# Patient Record
Sex: Female | Born: 1985
Health system: Southern US, Community
[De-identification: ages and names within clinical notes are randomized; demographics above are authoritative.]

## PROBLEM LIST (undated history)

## (undated) DIAGNOSIS — G43709 Chronic migraine without aura, not intractable, without status migrainosus: Secondary | ICD-10-CM

## (undated) DIAGNOSIS — R112 Nausea with vomiting, unspecified: Secondary | ICD-10-CM

## (undated) DIAGNOSIS — M5137 Other intervertebral disc degeneration, lumbosacral region: Secondary | ICD-10-CM

## (undated) DIAGNOSIS — T4145XA Adverse effect of unspecified anesthetic, initial encounter: Secondary | ICD-10-CM

## (undated) DIAGNOSIS — T8859XA Other complications of anesthesia, initial encounter: Secondary | ICD-10-CM

## (undated) DIAGNOSIS — M51379 Other intervertebral disc degeneration, lumbosacral region without mention of lumbar back pain or lower extremity pain: Secondary | ICD-10-CM

## (undated) DIAGNOSIS — J45909 Unspecified asthma, uncomplicated: Secondary | ICD-10-CM

## (undated) DIAGNOSIS — Z9889 Other specified postprocedural states: Secondary | ICD-10-CM

## (undated) DIAGNOSIS — Z8489 Family history of other specified conditions: Secondary | ICD-10-CM

## (undated) DIAGNOSIS — Z973 Presence of spectacles and contact lenses: Secondary | ICD-10-CM

## (undated) DIAGNOSIS — IMO0002 Reserved for concepts with insufficient information to code with codable children: Secondary | ICD-10-CM

## (undated) HISTORY — DX: Chronic migraine without aura, not intractable, without status migrainosus: G43.709

## (undated) HISTORY — DX: Unspecified asthma, uncomplicated: J45.909

## (undated) HISTORY — PX: CHOLECYSTECTOMY: SHX55

## (undated) HISTORY — PX: TONSILECTOMY, ADENOIDECTOMY, BILATERAL MYRINGOTOMY AND TUBES: SHX2538

## (undated) HISTORY — DX: Other intervertebral disc degeneration, lumbosacral region: M51.37

## (undated) HISTORY — DX: Reserved for concepts with insufficient information to code with codable children: IMO0002

## (undated) HISTORY — DX: Other intervertebral disc degeneration, lumbosacral region without mention of lumbar back pain or lower extremity pain: M51.379

## (undated) HISTORY — PX: PILONIDAL CYST EXCISION: SHX744

---
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
## 2013-08-28 DIAGNOSIS — O4700 False labor before 37 completed weeks of gestation, unspecified trimester: Secondary | ICD-10-CM

## 2017-02-02 DIAGNOSIS — L0591 Pilonidal cyst without abscess: Secondary | ICD-10-CM | POA: Insufficient documentation

## 2018-11-01 ENCOUNTER — Encounter: Payer: Self-pay | Admitting: Physician Assistant

## 2018-11-01 ENCOUNTER — Ambulatory Visit (INDEPENDENT_AMBULATORY_CARE_PROVIDER_SITE_OTHER): Payer: Self-pay | Admitting: Physician Assistant

## 2018-11-01 VITALS — BP 110/82 | HR 83 | Temp 97.6°F | Resp 14 | Wt 192.0 lb

## 2018-11-01 DIAGNOSIS — R05 Cough: Secondary | ICD-10-CM

## 2018-11-01 DIAGNOSIS — R059 Cough, unspecified: Secondary | ICD-10-CM

## 2018-11-01 DIAGNOSIS — R0981 Nasal congestion: Secondary | ICD-10-CM

## 2018-11-01 DIAGNOSIS — J0101 Acute recurrent maxillary sinusitis: Secondary | ICD-10-CM

## 2018-11-01 MED ORDER — AZELASTINE HCL 0.1 % NA SOLN: 1.0000 | Freq: Two times a day (BID) | NASAL | 0 refills | Status: DC

## 2018-11-01 MED ORDER — PSEUDOEPH-BROMPHEN-DM 30-2-10 MG/5ML PO SYRP: 5.0000 mL | ORAL_SOLUTION | Freq: Four times a day (QID) | ORAL | 0 refills | Status: DC | PRN

## 2018-11-01 MED ORDER — DOXYCYCLINE HYCLATE 100 MG PO TABS: 100.0000 mg | ORAL_TABLET | Freq: Two times a day (BID) | ORAL | 0 refills | Status: AC

## 2018-11-01 NOTE — Patient Instructions (Signed)
Thank you for choosing [facility] for your health care needs.  You have been diagnosed with sinusitis.  You have been prescribed an antibiotic, Doxycycline.  You have been prescribed an antihistamine nasal spray, Astelin. Use in conjunction with Flonase nasal spray.  You have been prescribed a cough syrup with a decongestant; will help with cough and nasal congestion and sinus pressure/congestion.  Increase fluids. Rest.  Continue to use albuterol inhaler; 2 puffs every 4-6 hours for cough, wheezing, and chest tightness.  Follow-up with your family physician in one week if symptoms not improving. Follow-up sooner with any worsening symptoms.  Sinusitis, Adult Sinusitis is soreness and swelling (inflammation) of your sinuses. Sinuses are hollow spaces in the bones around your face. They are located:  Around your eyes.  In the middle of your forehead.  Behind your nose.  In your cheekbones. Your sinuses and nasal passages are lined with a fluid called mucus. Mucus drains out of your sinuses. Swelling can trap mucus in your sinuses. This lets germs (bacteria, virus, or fungus) grow, which leads to infection. Most of the time, this condition is caused by a virus. What are the causes? This condition is caused by:  Allergies.  Asthma.  Germs.  Things that block your nose or sinuses.  Growths in the nose (nasal polyps).  Chemicals or irritants in the air.  Fungus (rare). What increases the risk? You are more likely to develop this condition if:  You have a weak body defense system (immune system).  You do a lot of swimming or diving.  You use nasal sprays too much.  You smoke. What are the signs or symptoms? The main symptoms of this condition are pain and a feeling of pressure around the sinuses. Other symptoms include:  Stuffy nose (congestion).  Runny nose (drainage).  Swelling and warmth in the sinuses.  Headache.  Toothache.  A cough that may get worse at  night.  Mucus that collects in the throat or the back of the nose (postnasal drip).  Being unable to smell and taste.  Being very tired (fatigue).  A fever.  Sore throat.  Bad breath. How is this diagnosed? This condition is diagnosed based on:  Your symptoms.  Your medical history.  A physical exam.  Tests to find out if your condition is short-term (acute) or long-term (chronic). Your doctor may: ? Check your nose for growths (polyps). ? Check your sinuses using a tool that has a light (endoscope). ? Check for allergies or germs. ? Do imaging tests, such as an MRI or CT scan. How is this treated? Treatment for this condition depends on the cause and whether it is short-term or long-term.  If caused by a virus, your symptoms should go away on their own within 10 days. You may be given medicines to relieve symptoms. They include: ? Medicines that shrink swollen tissue in the nose. ? Medicines that treat allergies (antihistamines). ? A spray that treats swelling of the nostrils. ? Rinses that help get rid of thick mucus in your nose (nasal saline washes).  If caused by bacteria, your doctor may wait to see if you will get better without treatment. You may be given antibiotic medicine if you have: ? A very bad infection. ? A weak body defense system.  If caused by growths in the nose, you may need to have surgery. Follow these instructions at home: Medicines  Take, use, or apply over-the-counter and prescription medicines only as told by your doctor. These  may include nasal sprays.  If you were prescribed an antibiotic medicine, take it as told by your doctor. Do not stop taking the antibiotic even if you start to feel better. Hydrate and humidify   Drink enough water to keep your pee (urine) pale yellow.  Use a cool mist humidifier to keep the humidity level in your home above 50%.  Breathe in steam for 10-15 minutes, 3-4 times a day, or as told by your doctor.  You can do this in the bathroom while a hot shower is running.  Try not to spend time in cool or dry air. Rest  Rest as much as you can.  Sleep with your head raised (elevated).  Make sure you get enough sleep each night. General instructions   Put a warm, moist washcloth on your face 3-4 times a day, or as often as told by your doctor. This will help with discomfort.  Wash your hands often with soap and water. If there is no soap and water, use hand sanitizer.  Do not smoke. Avoid being around people who are smoking (secondhand smoke).  Keep all follow-up visits as told by your doctor. This is important. Contact a doctor if:  You have a fever.  Your symptoms get worse.  Your symptoms do not get better within 10 days. Get help right away if:  You have a very bad headache.  You cannot stop throwing up (vomiting).  You have very bad pain or swelling around your face or eyes.  You have trouble seeing.  You feel confused.  Your neck is stiff.  You have trouble breathing. Summary  Sinusitis is swelling of your sinuses. Sinuses are hollow spaces in the bones around your face.  This condition is caused by tissues in your nose that become inflamed or swollen. This traps germs. These can lead to infection.  If you were prescribed an antibiotic medicine, take it as told by your doctor. Do not stop taking it even if you start to feel better.  Keep all follow-up visits as told by your doctor. This is important. This information is not intended to replace advice given to you by your health care provider. Make sure you discuss any questions you have with your health care provider. Document Released: 04/18/2008 Document Revised: 04/02/2018 Document Reviewed: 04/02/2018 Elsevier Interactive Patient Education  2019 [employer].

## 2018-11-01 NOTE — Progress Notes (Signed)
Patient ID: Anvitha Hutmacher DOB: October 12, 1986 AGE: 32 y.o. MRN: [id number]   PCP: No primary care provider on file.   Chief Complaint:  Chief Complaint  Patient presents with  . sinus pressure and congestion    x2weeks     Subjective:    HPI:  Fatma Rutten is a 32 y.o. female presents for evaluation  Chief Complaint  Patient presents with  . sinus pressure and congestion    x1weeks    32 year old female presents to [facility] with three week history of sinus symptoms. Reports began over Thanksgiving holiday with sore throat, rhinorrhea, nasal congestion, and cough. Completed televisit (cannot find documentation in Epic) with about 1 week of symptoms. Prescribed antibiotic that started with the letter "C", unsure of the name (suspect Clindamycin or possibly cephalosporin such as Ceclor, Ceftin, Cefzil - patient with allergies to Cipro and Clarithromycin). Patient states she was also prescribed 2 doses of Diflucan due to post-antibiotic vaginal yeast infection.  Patient states symptoms improved, never fully resolved. Then worsened after completing antibiotic. Reports nasal congestion, bilateral maxillary sinus pressure/congestion, postnasal drip, dry throat, chest congestion, coarse cough, and chest tightness. Patient has been using OTC Mucinex, Flonase and previously prescribed albuterol inhaler (denies asthma diagnosis, states she's used albuterol inhaler previously with bronchitis). Patient states today developed upper teeth pain and worsening bilateral maxillary sinus pain. Son sick with similar symptoms; treated for otitis media with amoxicillin. Patient denies fever, chills, headache, ear pain, neck pain, chest pain, SOB. Patient with seasonal allergies; uses Zyrtec during the spring season. Uses Flonase all year round. Denies smoking history.  A limited review of symptoms was performed, pertinent positives and negatives as mentioned in HPI.  The following portions of the  patient's history were reviewed and updated as appropriate: allergies, current medications and past medical history.  There are no active problems to display for this patient.   Allergies  Allergen Reactions  . Azithromycin Hives  . Ciprofloxacin Swelling  . Moxifloxacin Hcl   . Penicillins Hives  . Bactrim [Sulfamethoxazole-Trimethoprim] Rash    No current outpatient medications on file prior to visit.   No current facility-administered medications on file prior to visit.        Objective:   Vitals:   11/01/18 0826  BP: 110/82  Pulse: 83  Resp: 14  Temp: 97.6 F (36.4 C)  SpO2: 99%     Wt Readings from Last 3 Encounters:  11/01/18 192 lb (87.1 kg)    Physical Exam:   General Appearance:  Patient sitting comfortably on examination table. Conversational. Kermit Balo self-historian. In no acute distress. Does not appear ill. Afebrile.   Head:  Normocephalic, without obvious abnormality, atraumatic  Eyes:  PERRL, conjunctiva/corneas clear, EOM's intact  Ears:  Bilateral ear canals WNL. No erythema or edema. No discharge/drainage. Bilateral TMs WNL. No erythema, injection, or serous effusion. No scar tissue.  Nose: Nares normal, septum midline. Nasal mucosa with bilateral edema. Thick clear rhinorrhea. Nasally sounding voice. Patient unable to inhale through nose. Tenderness with palpation over bilateral maxillary sinuses.  Throat: Lips, mucosa, and tongue normal; teeth and gums normal. Throat reveals no erythema. Mild postnasal drip. Tonsils with no enlargement or exudate.  Neck: Supple, symmetrical, trachea midline, bilateral anterior cervical lymphadenopathy, tender to palpation (left side worse than right)  Lungs:   Clear to auscultation bilaterally, respirations unlabored. No rales, rhonchi, crackles or wheezing. No wheezing with forced expiration. Dry cough elicited with deep inspiration.  Heart:  Regular rate and  rhythm, S1 and S2 normal, no murmur, rub, or gallop   Extremities: Extremities normal, atraumatic, no cyanosis or edema  Pulses: 2+ and symmetric  Skin: Skin color, texture, turgor normal, no rashes or lesions  Lymph nodes: Cervical, supraclavicular, and axillary nodes normal  Neurologic: Normal    Assessment & Plan:    Exam findings, diagnosis etiology and medication use and indications reviewed with patient. Follow-Up and discharge instructions provided. No emergent/urgent issues found on exam.  Patient education was provided.   Patient verbalized understanding of information provided and agrees with plan of care (POC), all questions answered. The patient is advised to call or return to clinic if condition does not see an improvement in symptoms, or to seek the care of the closest emergency department if condition worsens with the below plan.   1. Acute recurrent maxillary sinusitis  - doxycycline (VIBRA-TABS) 100 MG tablet; Take 1 tablet (100 mg total) by mouth 2 (two) times daily for 7 days.  Dispense: 14 tablet; Refill: 0  2. Nasal congestion  - azelastine (ASTELIN) 0.1 % nasal spray; Place 1 spray into both nostrils 2 (two) times daily. Use in each nostril as directed  Dispense: 30 mL; Refill: 0  3. Cough  - brompheniramine-pseudoephedrine-DM 30-2-10 MG/5ML syrup; Take 5 mLs by mouth 4 (four) times daily as needed.  Dispense: 120 mL; Refill: 0  Patient with 3 week history of URI/sinusitis. Improved but failed to resolve with suspected Clindamycin course. Now 1 week of worsening URI symptoms with bilateral maxillary sinus pain and upper teeth pain. Afebrile. VSS. Due to 2nd illness & duration of symptoms, will treat patient for bacterial infection. Prescribed course of Doxycycline, Astelin nasal spray (to be used in conjunction with Flonase), and Bromfed cough syrup (decongestant will help with nasal congestion and sinus pressure/congestion - patient has not been using decongestant).  Discussed with patient, if 2nd course of  antibiotic fails, will have to follow-up with PCP and/or ENT (not [facility]). At that time, sinus rinse with culture, CT scan of sinuses, and/or additional evaluation/treatment techniques may be utilized. Patient agrees with plan.   Darlin Priestly, MHS, PA-C Montey Hora, MHS, PA-C Advanced Practice Provider [facility]  [address], [facility], [place], [place] [address] (p):  [phone] [email or web address]

## 2018-11-05 ENCOUNTER — Telehealth: Payer: Self-pay | Admitting: Emergency Medicine

## 2018-11-05 NOTE — Telephone Encounter (Signed)
Attempted to contact patient number has been d/c This was a follow up call from [facility] visit.

## 2018-11-26 ENCOUNTER — Telehealth: Payer: Self-pay

## 2018-12-12 ENCOUNTER — Encounter: Payer: Self-pay | Admitting: Obstetrics and Gynecology

## 2018-12-12 ENCOUNTER — Ambulatory Visit (INDEPENDENT_AMBULATORY_CARE_PROVIDER_SITE_OTHER): Payer: No Typology Code available for payment source | Admitting: Obstetrics and Gynecology

## 2018-12-12 ENCOUNTER — Other Ambulatory Visit (HOSPITAL_COMMUNITY)
Admission: RE | Admit: 2018-12-12 | Discharge: 2018-12-12 | Disposition: A | Discharge status: Home / Self Care | Payer: No Typology Code available for payment source | Source: Ambulatory Visit | Attending: Obstetrics and Gynecology | Admitting: Obstetrics and Gynecology

## 2018-12-12 VITALS — BP 110/60 | HR 76 | Ht 67.0 in | Wt 187.0 lb

## 2018-12-12 DIAGNOSIS — Z13220 Encounter for screening for lipoid disorders: Secondary | ICD-10-CM

## 2018-12-12 DIAGNOSIS — Z124 Encounter for screening for malignant neoplasm of cervix: Secondary | ICD-10-CM | POA: Insufficient documentation

## 2018-12-12 DIAGNOSIS — Z789 Other specified health status: Secondary | ICD-10-CM

## 2018-12-12 DIAGNOSIS — Z1329 Encounter for screening for other suspected endocrine disorder: Secondary | ICD-10-CM

## 2018-12-12 DIAGNOSIS — Z01419 Encounter for gynecological examination (general) (routine) without abnormal findings: Secondary | ICD-10-CM | POA: Diagnosis not present

## 2018-12-12 DIAGNOSIS — Z0000 Encounter for general adult medical examination without abnormal findings: Secondary | ICD-10-CM

## 2018-12-12 DIAGNOSIS — Z130 Encounter for screening for diseases of the blood and blood-forming organs and certain disorders involving the immune mechanism: Secondary | ICD-10-CM

## 2018-12-12 DIAGNOSIS — Z131 Encounter for screening for diabetes mellitus: Secondary | ICD-10-CM

## 2018-12-12 NOTE — Progress Notes (Signed)
Gynecology Annual Exam   PCP: Patient, No Pcp Per  Chief Complaint:  Chief Complaint  Patient presents with  . Gynecologic Exam    History of Present Illness: Patient is a 33 y.o. G1P1001 presents for annual exam. The patient has no complaints today.   LMP: Patient's last menstrual period was 11/26/2018 (exact date). Average Interval: regular, 28 days Duration of flow: 6 days Heavy Menses: no Clots: no Intermenstrual Bleeding: no Postcoital Bleeding: no Dysmenorrhea: no  The patient is sexually active. She currently uses condoms for contraception. She denies dyspareunia.  The patient does not perform self breast exams.  There is no notable family history of breast or ovarian cancer in her family.  The patient wears seatbelts: yes.   The patient has regular exercise: yes.    The patient denies current symptoms of depression.    Review of Systems: ROS  Past Medical History:  Past Medical History:  Diagnosis Date  . Chronic migraine   . DDD (degenerative disc disease), lumbosacral     Past Surgical History:  Past Surgical History:  Procedure Laterality Date  . CHOLECYSTECTOMY    . PILONIDAL CYST EXCISION    . TONSILECTOMY, ADENOIDECTOMY, BILATERAL MYRINGOTOMY AND TUBES      Gynecologic History:  Patient's last menstrual period was 11/26/2018 (exact date). Contraception: condoms Last Pap: Results were: unknown   Obstetric History: G1P1001  Family History:  Family History  Problem Relation Age of Onset  . Brain cancer Father 40  . Valvular heart disease Sister   . Brain cancer Paternal Grandmother 63    Social History:  Social History   Socioeconomic History  . Marital status: Married    Spouse name: Not on file  . Number of children: Not on file  . Years of education: Not on file  . Highest education level: Not on file  Occupational History  . Not on file  Social Needs  . Financial resource strain: Not on file  . Food insecurity:    Worry:  Not on file    Inability: Not on file  . Transportation needs:    Medical: Not on file    Non-medical: Not on file  Tobacco Use  . Smoking status: Never Smoker  . Smokeless tobacco: Never Used  Substance and Sexual Activity  . Alcohol use: Yes    Comment: Social   . Drug use: Never  . Sexual activity: Yes    Birth control/protection: Condom  Lifestyle  . Physical activity:    Days per week: Not on file    Minutes per session: Not on file  . Stress: Not on file  Relationships  . Social connections:    Talks on phone: Not on file    Gets together: Not on file    Attends religious service: Not on file    Active member of club or organization: Not on file    Attends meetings of clubs or organizations: Not on file    Relationship status: Not on file  . Intimate partner violence:    Fear of current or ex partner: Not on file    Emotionally abused: Not on file    Physically abused: Not on file    Forced sexual activity: Not on file  Other Topics Concern  . Not on file  Social History Narrative  . Not on file    Allergies:  Allergies  Allergen Reactions  . Azithromycin Hives  . Ciprofloxacin Swelling  . Moxifloxacin Hcl   .  Penicillins Hives  . Bactrim [Sulfamethoxazole-Trimethoprim] Rash    Medications: Prior to Admission medications   Medication Sig Start Date End Date Taking? Authorizing Provider  azelastine (ASTELIN) 0.1 % nasal spray Place 1 spray into both nostrils 2 (two) times daily. Use in each nostril as directed 11/01/18  Yes Darlin Priestly, PA-C  Multiple Vitamins-Minerals (MULTIVITAL PO) Take by mouth.   Yes [provider]  topiramate (TOPAMAX) 25 MG tablet Take by mouth.   Yes [provider]    Physical Exam Vitals: Blood pressure 110/60, pulse 76, height 5\' 7"  (1.702 m), weight 187 lb (84.8 kg), last menstrual period 11/26/2018.  General: NAD HEENT: normocephalic, anicteric Thyroid: no enlargement, no palpable  nodules Pulmonary: No increased work of breathing, CTAB Cardiovascular: RRR, distal pulses 2+ Breast: Breast symmetrical, no tenderness, no palpable nodules or masses, no skin or nipple retraction present, no nipple discharge.  No axillary or supraclavicular lymphadenopathy. Abdomen: NABS, soft, non-tender, non-distended.  Umbilicus without lesions.  No hepatomegaly, splenomegaly or masses palpable. No evidence of hernia  Genitourinary:  External: Normal external female genitalia.  Normal urethral meatus, normal Bartholin's and Skene's glands.    Vagina: Normal vaginal mucosa, no evidence of prolapse.    Cervix: Grossly normal in appearance, no bleeding  Uterus: Non-enlarged, mobile, normal contour.  No CMT  Adnexa: ovaries non-enlarged, no adnexal masses  Rectal: deferred  Lymphatic: no evidence of inguinal lymphadenopathy Extremities: no edema, erythema, or tenderness Neurologic: Grossly intact Psychiatric: mood appropriate, affect full  Female chaperone present for pelvic and breast  portions of the physical exam    Assessment: 33 y.o. G1P1001 routine annual exam  Plan: Problem List Items Addressed This Visit    None    Visit Diagnoses    Healthcare maintenance    -  Primary   Relevant Orders   Cytology - PAP   Lipid panel   CBC   TSH + free T4   Basic Metabolic Panel (BMET)   Cervical cancer screening       Relevant Orders   Cytology - PAP   Lipid panel   Health maintenance alteration       Relevant Orders   TSH + free T4   Screening cholesterol level       Relevant Orders   Lipid panel   Thyroid disorder screening       Relevant Orders   TSH + free T4   Screening, anemia, deficiency, iron       Relevant Orders   CBC   Screening for diabetes mellitus       Relevant Orders   Basic Metabolic Panel (BMET)      2) STI screening  was offered and declined  2)  ASCCP guidelines and rational discussed.  Patient opts for every 3 years screening interval  3)  Contraception - the patient is currently using condoms. She is happy with her current form of contraception and plans to continue. May consider pregnancy in the future.  4) Routine healthcare maintenance including cholesterol, diabetes screening discussed Ordered today  5) No follow-ups on file.  Adrian Prows MD [facility], [facility] 12/12/2018 11:41 AM

## 2018-12-13 LAB — LIPID PANEL
Chol/HDL Ratio: 2.3 ratio (ref 0.0–4.4)
Cholesterol, Total: 170 mg/dL (ref 100–199)
HDL: 74 mg/dL (ref >39)
LDL Calculated: 82 mg/dL (ref 0–99)
TRIGLYCERIDES: 68 mg/dL (ref 0–149)
VLDL Cholesterol Cal: 14 mg/dL (ref 5–40)

## 2018-12-13 LAB — BASIC METABOLIC PANEL
BUN / CREAT RATIO: 19 (ref 9–23)
BUN: 14 mg/dL (ref 6–20)
CO2: 23 mmol/L (ref 20–29)
Calcium: 9.8 mg/dL (ref 8.7–10.2)
Chloride: 101 mmol/L (ref 96–106)
Creatinine, Ser: 0.74 mg/dL (ref 0.57–1.00)
GFR calc Af Amer: 125 mL/min/{1.73_m2} (ref >59)
GFR calc non Af Amer: 108 mL/min/{1.73_m2} (ref >59)
Glucose: 80 mg/dL (ref 65–99)
POTASSIUM: 4.6 mmol/L (ref 3.5–5.2)
SODIUM: 139 mmol/L (ref 134–144)

## 2018-12-13 LAB — CBC
HEMOGLOBIN: 15.4 g/dL (ref 11.1–15.9)
Hematocrit: 45.5 % (ref 34.0–46.6)
MCH: 30.8 pg (ref 26.6–33.0)
MCHC: 33.8 g/dL (ref 31.5–35.7)
MCV: 91 fL (ref 79–97)
Platelets: 341 10*3/uL (ref 150–450)
RBC: 5 x10E6/uL (ref 3.77–5.28)
RDW: 12.2 % (ref 11.7–15.4)
WBC: 10.3 10*3/uL (ref 3.4–10.8)

## 2018-12-13 LAB — TSH+FREE T4
Free T4: 1.13 ng/dL (ref 0.82–1.77)
TSH: 1.17 u[IU]/mL (ref 0.450–4.500)

## 2018-12-13 NOTE — Progress Notes (Signed)
Normal, released to mychart

## 2018-12-17 LAB — CYTOLOGY - PAP
Diagnosis: NEGATIVE
HPV: NOT DETECTED

## 2018-12-18 NOTE — Telephone Encounter (Signed)
Zolpidem er 12.5mg  is denied.

## 2018-12-21 NOTE — Progress Notes (Signed)
Negative, Released to mychart 

## 2019-03-05 MED FILL — TOPIRAMATE 25 MG TABLET: 25 | 30 days supply | Qty: 30 | Fill #0

## 2019-05-06 ENCOUNTER — Other Ambulatory Visit: Payer: Self-pay

## 2019-05-06 ENCOUNTER — Encounter: Payer: Self-pay | Admitting: *Deleted

## 2019-05-07 ENCOUNTER — Other Ambulatory Visit
Admission: RE | Admit: 2019-05-07 | Discharge: 2019-05-07 | Disposition: A | Discharge status: Home / Self Care | Payer: No Typology Code available for payment source | Source: Ambulatory Visit | Attending: Unknown Physician Specialty | Admitting: Unknown Physician Specialty

## 2019-05-07 DIAGNOSIS — Z1159 Encounter for screening for other viral diseases: Secondary | ICD-10-CM | POA: Insufficient documentation

## 2019-05-09 LAB — NOVEL CORONAVIRUS, NAA (HOSP ORDER, SEND-OUT TO REF LAB; TAT 18-24 HRS): SARS-CoV-2, NAA: NOT DETECTED

## 2019-05-10 ENCOUNTER — Ambulatory Visit: Payer: No Typology Code available for payment source | Admitting: Anesthesiology

## 2019-05-10 ENCOUNTER — Ambulatory Visit
Admission: RE | Admit: 2019-05-10 | Discharge: 2019-05-10 | Disposition: A | Discharge status: Home / Self Care | Payer: No Typology Code available for payment source | Attending: Unknown Physician Specialty | Admitting: Unknown Physician Specialty

## 2019-05-10 ENCOUNTER — Encounter
Admission: RE | Disposition: A | Discharge status: Home / Self Care | Payer: Self-pay | Source: Home / Self Care | Attending: Unknown Physician Specialty

## 2019-05-10 ENCOUNTER — Other Ambulatory Visit: Payer: Self-pay

## 2019-05-10 DIAGNOSIS — Z881 Allergy status to other antibiotic agents status: Secondary | ICD-10-CM | POA: Insufficient documentation

## 2019-05-10 DIAGNOSIS — J45909 Unspecified asthma, uncomplicated: Secondary | ICD-10-CM | POA: Insufficient documentation

## 2019-05-10 DIAGNOSIS — H722X1 Other marginal perforations of tympanic membrane, right ear: Secondary | ICD-10-CM | POA: Diagnosis not present

## 2019-05-10 DIAGNOSIS — Z79899 Other long term (current) drug therapy: Secondary | ICD-10-CM | POA: Diagnosis not present

## 2019-05-10 DIAGNOSIS — Z880 Allergy status to penicillin: Secondary | ICD-10-CM | POA: Diagnosis not present

## 2019-05-10 HISTORY — DX: Presence of spectacles and contact lenses: Z97.3

## 2019-05-10 HISTORY — DX: Other specified postprocedural states: R11.2

## 2019-05-10 HISTORY — DX: Family history of other specified conditions: Z84.89

## 2019-05-10 HISTORY — DX: Other complications of anesthesia, initial encounter: T88.59XA

## 2019-05-10 HISTORY — PX: TYMPANOPLASTY: SHX33

## 2019-05-10 HISTORY — DX: Other specified postprocedural states: Z98.890

## 2019-05-10 LAB — POCT PREGNANCY, URINE: Preg Test, Ur: NEGATIVE

## 2019-05-10 SURGERY — TYMPANOPLASTY
Anesthesia: General | Site: Ear | Laterality: Right

## 2019-05-10 MED ORDER — ONDANSETRON HCL 4 MG/2ML IJ SOLN: INTRAMUSCULAR | Status: DC | PRN

## 2019-05-10 MED ORDER — LIDOCAINE-EPINEPHRINE 1 %-1:100000 IJ SOLN
INTRAMUSCULAR | Status: DC | PRN
  Administered 2019-05-10: 3 mL

## 2019-05-10 MED ORDER — ONDANSETRON HCL 4 MG/2ML IJ SOLN
INTRAMUSCULAR | Status: DC | PRN
  Administered 2019-05-10: 4 mg via INTRAVENOUS

## 2019-05-10 MED ORDER — SCOPOLAMINE 1 MG/3DAYS TD PT72
1.0000 | MEDICATED_PATCH | TRANSDERMAL | Status: DC
  Administered 2019-05-10: 1.5 mg via TRANSDERMAL

## 2019-05-10 MED ORDER — ONDANSETRON HCL 4 MG/2ML IJ SOLN: 4.0000 mg | Freq: Once | INTRAMUSCULAR | Status: DC | PRN

## 2019-05-10 MED ORDER — MIDAZOLAM HCL 5 MG/5ML IJ SOLN
INTRAMUSCULAR | Status: DC | PRN
  Administered 2019-05-10: 2 mg via INTRAVENOUS

## 2019-05-10 MED ORDER — GLYCOPYRROLATE 0.2 MG/ML IJ SOLN
INTRAMUSCULAR | Status: DC | PRN
  Administered 2019-05-10: 0.1 mg via INTRAVENOUS

## 2019-05-10 MED ORDER — OXYCODONE HCL 5 MG PO TABS: 5.0000 mg | ORAL_TABLET | Freq: Once | ORAL | Status: DC | PRN

## 2019-05-10 MED ORDER — LIDOCAINE HCL (CARDIAC) PF 100 MG/5ML IV SOSY
PREFILLED_SYRINGE | INTRAVENOUS | Status: DC | PRN
  Administered 2019-05-10: 40 mg via INTRATRACHEAL

## 2019-05-10 MED ORDER — OXYCODONE HCL 5 MG/5ML PO SOLN: 5.0000 mg | Freq: Once | ORAL | Status: DC | PRN

## 2019-05-10 MED ORDER — DEXAMETHASONE SODIUM PHOSPHATE 4 MG/ML IJ SOLN
INTRAMUSCULAR | Status: DC | PRN
  Administered 2019-05-10: 10 mg via INTRAVENOUS

## 2019-05-10 MED ORDER — LACTATED RINGERS IV SOLN
INTRAVENOUS | Status: DC
  Administered 2019-05-10: 12:00:00 via INTRAVENOUS

## 2019-05-10 MED ORDER — OXYCODONE-ACETAMINOPHEN 5-325 MG PO TABS: 1.0000 | ORAL_TABLET | ORAL | 0 refills | Status: DC | PRN

## 2019-05-10 MED ORDER — EPINEPHRINE PF 1 MG/ML IJ SOLN
INTRAMUSCULAR | Status: DC | PRN
  Administered 2019-05-10: 1 mg

## 2019-05-10 MED ORDER — GELATIN ABSORBABLE 12-7 MM EX MISC
CUTANEOUS | Status: DC | PRN
  Administered 2019-05-10: 1 via TOPICAL

## 2019-05-10 MED ORDER — PROMETHAZINE HCL 25 MG/ML IJ SOLN
12.5000 mg | Freq: Once | INTRAMUSCULAR | Status: AC
  Administered 2019-05-10: 12.5 mg via INTRAVENOUS

## 2019-05-10 MED ORDER — HYDROMORPHONE HCL 1 MG/ML IJ SOLN: 0.2500 mg | INTRAMUSCULAR | Status: DC | PRN

## 2019-05-10 MED ORDER — PROPOFOL 10 MG/ML IV BOLUS
INTRAVENOUS | Status: DC | PRN
  Administered 2019-05-10: 140 mg via INTRAVENOUS
  Administered 2019-05-10: 30 mg via INTRAVENOUS

## 2019-05-10 MED ORDER — FENTANYL CITRATE (PF) 100 MCG/2ML IJ SOLN
INTRAMUSCULAR | Status: DC | PRN
  Administered 2019-05-10 (×2): 12.5 ug via INTRAVENOUS
  Administered 2019-05-10: 100 ug via INTRAVENOUS

## 2019-05-10 SURGICAL SUPPLY — 28 items
ADHESIVE MASTISOL STRL (MISCELLANEOUS) ×6 IMPLANT
BLADE EAR TYMPAN 2.5 60D BEAV (BLADE) ×3 IMPLANT
CANISTER SUCT 1200ML W/VALVE (MISCELLANEOUS) ×3 IMPLANT
CATH IV 18X1 1/4 SAFELET (CATHETERS) ×3 IMPLANT
CLEANER CAUTERY TIP 5X5 PAD (MISCELLANEOUS) ×1 IMPLANT
COTTONBALL LRG STERILE PKG (GAUZE/BANDAGES/DRESSINGS) ×3 IMPLANT
DRAPE HEAD BAR (DRAPES) ×3 IMPLANT
DRAPE MICROSCOPE ZEISS INVISI (DRAPES) ×3 IMPLANT
DRAPE SURG 17X11 SM STRL (DRAPES) ×6 IMPLANT
ELECT CAUTERY NDL 2.0 MIC (NEEDLE) IMPLANT
ELECT CAUTERY NEEDLE 2.0 MIC (NEEDLE) ×3 IMPLANT
GLOVE BIO SURGEON STRL SZ7.5 (GLOVE) ×6 IMPLANT
GLOVE SURG TRIUMPH 8.0 PF LTX (GLOVE) ×3 IMPLANT
IV CATH 18X1 1/4 SAFELET (CATHETERS) ×1
KIT TURNOVER KIT A (KITS) ×3 IMPLANT
NDL HYPO 25GX1X1/2 BEV (NEEDLE) ×1 IMPLANT
NEEDLE HYPO 25GX1X1/2 BEV (NEEDLE) ×3 IMPLANT
NS IRRIG 500ML POUR BTL (IV SOLUTION) ×3 IMPLANT
PACK ENT CUSTOM (PACKS) ×3 IMPLANT
PAD CLEANER CAUTERY TIP 5X5 (MISCELLANEOUS) ×2
SLEEVE PROTECTION STRL DISP (MISCELLANEOUS) ×6 IMPLANT
SOL PREP PVP 2OZ (MISCELLANEOUS) ×3
SOLUTION PREP PVP 2OZ (MISCELLANEOUS) ×1 IMPLANT
STAPLER SKIN PROX 35W (STAPLE) ×3 IMPLANT
STRAP BODY AND KNEE 60X3 (MISCELLANEOUS) ×3 IMPLANT
SUT PLAIN GUT FAST 5-0 (SUTURE) ×3 IMPLANT
SYR 3ML LL SCALE MARK (SYRINGE) ×6 IMPLANT
TOWEL OR 17X26 4PK STRL BLUE (TOWEL DISPOSABLE) ×3 IMPLANT

## 2019-05-10 NOTE — Anesthesia Postprocedure Evaluation (Signed)
Anesthesia Post Note  Patient: Therapist, music  Procedure(s) Performed: TYMPANOPLASTY (Right Ear)  Patient location during evaluation: PACU Anesthesia Type: General Level of consciousness: awake and alert Pain management: pain level controlled Vital Signs Assessment: post-procedure vital signs reviewed and stable Respiratory status: spontaneous breathing, nonlabored ventilation, respiratory function stable and patient connected to nasal cannula oxygen Cardiovascular status: blood pressure returned to baseline and stable Postop Assessment: no apparent nausea or vomiting Anesthetic complications: no    Trecia Rogers

## 2019-05-10 NOTE — Anesthesia Preprocedure Evaluation (Signed)
Anesthesia Evaluation  Patient identified by MRN, date of birth, ID band Patient awake    Reviewed: Allergy & Precautions, H&P , NPO status , Patient's Chart, lab work & pertinent test results, reviewed documented beta blocker date and time   History of Anesthesia Complications (+) PONV and history of anesthetic complications  Airway Mallampati: II  TM Distance: >3 FB Neck ROM: full    Dental no notable dental hx.    Pulmonary neg pulmonary ROS,    Pulmonary exam normal breath sounds clear to auscultation       Cardiovascular Exercise Tolerance: Good negative cardio ROS Normal cardiovascular exam Rhythm:regular Rate:Normal     Neuro/Psych negative neurological ROS  negative psych ROS   GI/Hepatic negative GI ROS, Neg liver ROS,   Endo/Other  negative endocrine ROS  Renal/GU negative Renal ROS  negative genitourinary   Musculoskeletal   Abdominal   Peds  Hematology negative hematology ROS (+)   Anesthesia Other Findings   Reproductive/Obstetrics negative OB ROS                             Anesthesia Physical Anesthesia Plan  ASA: II  Anesthesia Plan: General   Post-op Pain Management:    Induction:   PONV Risk Score and Plan:   Airway Management Planned:   Additional Equipment:   Intra-op Plan:   Post-operative Plan:   Informed Consent: I have reviewed the patients History and Physical, chart, labs and discussed the procedure including the risks, benefits and alternatives for the proposed anesthesia with the patient or authorized representative who has indicated his/her understanding and acceptance.     Dental Advisory Given  Plan Discussed with: CRNA and Anesthesiologist  Anesthesia Plan Comments:         Anesthesia Quick Evaluation

## 2019-05-10 NOTE — Op Note (Signed)
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  After general laryngeal mask anesthesia the table was turned 90 degrees.  A local anesthetic of 1% lidocaine with 1 100,000 units of epinephrine was used to inject the tragus the ear canal postauricular crease a total of 3 cc was used.  The right ear was then prepped and draped sterilely.  The operating microscope was brought in the field, examination of the tympanic membrane showed approximately 60% perforation the pars tensa posteriorly and inferiorly that involve the annulus.  A straight needle was then used to rim the perforation removing the epithelial tract there was also several lesions to the middle ear space which were lysed using the curved needle.  A tympanomeatal incision was made from 6-12 o'clock posterior canal wall.  A cotton ball with adrenaline was then placed in the canal.  Attention was then turned to the tragal graft harvesting, an incision was made along the leading edge of the tragus and a tragal perichondrial graft was harvested in the standard fashion.  This was placed in the fascia press.  Tragal incision was closed using interrupted 5-0 fast-absorbing gut.  The ear canal was readdressed the cotton balls were removed the tympanomeatal incision was elevated down to the annulus and the annulus was lifted anteriorly entering the middle ear space.  The incudostapedial joint could be visualized and was intact there is no erosion of the incus.  The middle ear space was then packed with Gelfoam.  The  tragal perichondrial graft was laid beneath the tympanic membrane in a medial underlay fashion beneath all edges of the tympanic membrane and with the posterior portion of the graft draped up the ear canal.  Tympanomeatal flap was then laid back over the posterior aspect of the graft and into anatomic position.  Gelfoam with adrenaline was then placed against the tympanomeatal incision.  Ear canal was then filled with bacitracin ointment followed by cottonball.  Patient was then returned to anesthesia where she was awakened in the operating room taken to her room in stable condition.    Dispo:   Good  Plan: Follow-up 10 days discharged home  Roena Malady  05/10/2019 1:15 PM

## 2019-05-10 NOTE — Discharge Instructions (Signed)
Scopolamine skin patches REMOVE PATCH IN 72 HOURS AND [place] HANDS IMMEDIATELY  What is this medicine? SCOPOLAMINE (skoe POL a meen) is used to prevent nausea and vomiting caused by motion sickness, anesthesia and surgery. This medicine may be used for other purposes; ask your health care provider or pharmacist if you have questions. COMMON BRAND NAME(S): Transderm Scop What should I tell my health care provider before I take this medicine? They need to know if you have any of these conditions: -are scheduled to have a gastric secretion test -glaucoma -heart disease -kidney disease -liver disease -lung or breathing disease, like asthma -mental illness -prostate disease -seizures -stomach or intestine problems -trouble passing urine -an unusual or allergic reaction to scopolamine, atropine, other medicines, foods, dyes, or preservatives -pregnant or trying to get pregnant -breast-feeding How should I use this medicine? This medicine is for external use only. Follow the directions on the prescription label. Wear only 1 patch at a time. Choose an area behind the ear, that is clean, dry, hairless and free from any cuts or irritation. Wipe the area with a clean dry tissue. Peel off the plastic backing of the skin patch, trying not to touch the adhesive side with your hands. Do not cut the patches. Firmly apply to the area you have chosen, with the metallic side of the patch to the skin and the tan-colored side showing. Once firmly in place, wash your hands well with soap and water. Do not get this medicine into your eyes. After removing the patch, wash your hands and the area behind your ear thoroughly with soap and water. The patch will still contain some medicine after use. To avoid accidental contact or ingestion by children or pets, fold the used patch in half with the sticky side together and throw away in the trash out of the reach of children and pets. If you need to use a second patch after  you remove the first, place it behind the other ear. A special MedGuide will be given to you by the pharmacist with each prescription and refill. Be sure to read this information carefully each time. Talk to your pediatrician regarding the use of this medicine in children. Special care may be needed. Overdosage: If you think you have taken too much of this medicine contact a poison control center or emergency room at once. NOTE: This medicine is only for you. Do not share this medicine with others. What if I miss a dose? This does not apply. This medicine is not for regular use. What may interact with this medicine? -alcohol -antihistamines for allergy cough and cold -atropine -certain medicines for anxiety or sleep -certain medicines for bladder problems like oxybutynin, tolterodine -certain medicines for depression like amitriptyline, fluoxetine, sertraline -certain medicines for stomach problems like dicyclomine, hyoscyamine -certain medicines for Parkinson's disease like benztropine, trihexyphenidyl -certain medicines for seizures like phenobarbital, primidone -general anesthetics like halothane, isoflurane, methoxyflurane, propofol -ipratropium -local anesthetics like lidocaine, pramoxine, tetracaine -medicines that relax muscles for surgery -phenothiazines like chlorpromazine, mesoridazine, prochlorperazine, thioridazine -narcotic medicines for pain -other belladonna alkaloids This list may not describe all possible interactions. Give your health care provider a list of all the medicines, herbs, non-prescription drugs, or dietary supplements you use. Also tell them if you smoke, drink alcohol, or use illegal drugs. Some items may interact with your medicine. What should I watch for while using this medicine? Limit contact with water while swimming and bathing because the patch may fall off. If the patch falls  off, throw it away and put a new one behind the other ear. You may get  drowsy or dizzy. Do not drive, use machinery, or do anything that needs mental alertness until you know how this medicine affects you. Do not stand or sit up quickly, especially if you are an older patient. This reduces the risk of dizzy or fainting spells. Alcohol may interfere with the effect of this medicine. Avoid alcoholic drinks. Your mouth may get dry. Chewing sugarless gum or sucking hard candy, and drinking plenty of water may help. Contact your healthcare professional if the problem does not go away or is severe. This medicine may cause dry eyes and blurred vision. If you wear contact lenses, you may feel some discomfort. Lubricating drops may help. See your healthcare professional if the problem does not go away or is severe. If you are going to need surgery, an MRI, CT scan, or other procedure, tell your healthcare professional that you are using this medicine. You may need to remove the patch before the procedure. What side effects may I notice from receiving this medicine? Side effects that you should report to your doctor or health care professional as soon as possible: -allergic reactions like skin rash, itching or hives; swelling of the face, lips, or tongue -blurred vision -changes in vision -confusion -dizziness -eye pain -fast, irregular heartbeat -hallucinations, loss of contact with reality -nausea, vomiting -pain or trouble passing urine -restlessness -seizures -skin irritation -stomach pain Side effects that usually do not require medical attention (report to your doctor or health care professional if they continue or are bothersome): -drowsiness -dry mouth -headache -sore throat This list may not describe all possible side effects. Call your doctor for medical advice about side effects. You may report side effects to FDA at 1-800-FDA-1088. Where should I keep my medicine? Keep out of the reach of children. Store at room temperature between 20 and 25 degrees C (68  and 77 degrees F). Keep this medicine in the foil package until ready to use. Throw away any unused medicine after the expiration date. NOTE: This sheet is a summary. It may not cover all possible information. If you have questions about this medicine, talk to your doctor, pharmacist, or health care provider.  2019 Elsevier/Gold Standard (2018-01-19 16:14:46)   General Anesthesia, Adult, Care After This sheet gives you information about how to care for yourself after your procedure. Your health care provider may also give you more specific instructions. If you have problems or questions, contact your health care provider. What can I expect after the procedure? After the procedure, the following side effects are common:  Pain or discomfort at the IV site.  Nausea.  Vomiting.  Sore throat.  Trouble concentrating.  Feeling cold or chills.  Weak or tired.  Sleepiness and fatigue.  Soreness and body aches. These side effects can affect parts of the body that were not involved in surgery. Follow these instructions at home:  For at least 24 hours after the procedure:  Have a responsible adult stay with you. It is important to have someone help care for you until you are awake and alert.  Rest as needed.  Do not: ? Participate in activities in which you could fall or become injured. ? Drive. ? Use heavy machinery. ? Drink alcohol. ? Take sleeping pills or medicines that cause drowsiness. ? Make important decisions or sign legal documents. ? Take care of children on your own. Eating and drinking  Follow any instructions  from your health care provider about eating or drinking restrictions.  When you feel hungry, start by eating small amounts of foods that are soft and easy to digest (bland), such as toast. Gradually return to your regular diet.  Drink enough fluid to keep your urine pale yellow.  If you vomit, rehydrate by drinking water, juice, or clear broth. General  instructions  If you have sleep apnea, surgery and certain medicines can increase your risk for breathing problems. Follow instructions from your health care provider about wearing your sleep device: ? Anytime you are sleeping, including during daytime naps. ? While taking prescription pain medicines, sleeping medicines, or medicines that make you drowsy.  Return to your normal activities as told by your health care provider. Ask your health care provider what activities are safe for you.  Take over-the-counter and prescription medicines only as told by your health care provider.  If you smoke, do not smoke without supervision.  Keep all follow-up visits as told by your health care provider. This is important. Contact a health care provider if:  You have nausea or vomiting that does not get better with medicine.  You cannot eat or drink without vomiting.  You have pain that does not get better with medicine.  You are unable to pass urine.  You develop a skin rash.  You have a fever.  You have redness around your IV site that gets worse. Get help right away if:  You have difficulty breathing.  You have chest pain.  You have blood in your urine or stool, or you vomit blood. Summary  After the procedure, it is common to have a sore throat or nausea. It is also common to feel tired.  Have a responsible adult stay with you for the first 24 hours after general anesthesia. It is important to have someone help care for you until you are awake and alert.  When you feel hungry, start by eating small amounts of foods that are soft and easy to digest (bland), such as toast. Gradually return to your regular diet.  Drink enough fluid to keep your urine pale yellow.  Return to your normal activities as told by your health care provider. Ask your health care provider what activities are safe for you. This information is not intended to replace advice given to you by your health care  provider. Make sure you discuss any questions you have with your health care provider. Document Released: 02/06/2001 Document Revised: 06/16/2017 Document Reviewed: 06/16/2017 Elsevier Interactive Patient Education  2019 [employer].

## 2019-05-10 NOTE — Transfer of Care (Signed)
Immediate Anesthesia Transfer of Care Note  Patient: Mary Richards  Procedure(s) Performed: TYMPANOPLASTY (Right Ear)  Patient Location: PACU  Anesthesia Type: General  Level of Consciousness: awake, alert  and patient cooperative  Airway and Oxygen Therapy: Patient Spontanous Breathing and Patient connected to supplemental oxygen  Post-op Assessment: Post-op Vital signs reviewed, Patient's Cardiovascular Status Stable, Respiratory Function Stable, Patent Airway and No signs of Nausea or vomiting  Post-op Vital Signs: Reviewed and stable  Complications: No apparent anesthesia complications

## 2019-05-10 NOTE — H&P (Signed)
The patient's history has been reviewed, patient examined, no change in status, stable for surgery.  Questions were answered to the patients satisfaction.  

## 2019-05-10 NOTE — Anesthesia Procedure Notes (Signed)
Procedure Name: LMA Insertion Date/Time: 05/10/2019 12:26 PM Performed by: Mayme Genta, CRNA Pre-anesthesia Checklist: Patient identified, Emergency Drugs available, Suction available, Timeout performed and Patient being monitored Patient Re-evaluated:Patient Re-evaluated prior to induction Oxygen Delivery Method: Circle system utilized Preoxygenation: Pre-oxygenation with 100% oxygen Induction Type: IV induction LMA: LMA inserted LMA Size: 4.0 Number of attempts: 1 Placement Confirmation: positive ETCO2 and breath sounds checked- equal and bilateral Tube secured with: Tape

## 2019-05-13 ENCOUNTER — Encounter: Payer: Self-pay | Admitting: Unknown Physician Specialty

## 2019-05-13 MED FILL — TOPIRAMATE 25 MG TABLET: 25 | 30 days supply | Qty: 30 | Fill #0

## 2019-05-13 NOTE — Progress Notes (Signed)
Patient complained of numbness on her bottom lip and brusing down her neck from the graft area, instructed her to notify MD office.

## 2019-07-01 ENCOUNTER — Telehealth: Payer: Self-pay | Admitting: Family

## 2019-07-01 NOTE — Telephone Encounter (Signed)
Patient has an appointment 07/08/19 scheduled as new patient.

## 2019-07-01 NOTE — Telephone Encounter (Signed)
Copied from [place] [id number]. Topic: Referral - Request for Referral >> Jul 01, 2019  2:57 PM Alanda Slim E wrote: Has patient seen PCP for this complaint? No  *If NO, is insurance requiring patient see PCP for this issue before PCP can refer them? yes  Referral for which specialty: chiropractor  Preferred provider/office: Dr. Rocco Pauls- [facility] [phone] Reason for referral: Pt has chronic back pains and back adjustments every so often   It wasn't allowing me to route crm so I created TE

## 2019-07-01 NOTE — Telephone Encounter (Signed)
Advise patient that we can address any referrals once she has established care ; her appt 07/08/19

## 2019-07-05 NOTE — Telephone Encounter (Signed)
Patient aware.

## 2019-07-08 ENCOUNTER — Ambulatory Visit: Payer: No Typology Code available for payment source | Admitting: Family

## 2019-07-08 ENCOUNTER — Ambulatory Visit (INDEPENDENT_AMBULATORY_CARE_PROVIDER_SITE_OTHER): Payer: No Typology Code available for payment source | Admitting: Family

## 2019-07-08 ENCOUNTER — Encounter: Payer: Self-pay | Admitting: Family

## 2019-07-08 ENCOUNTER — Other Ambulatory Visit: Payer: Self-pay

## 2019-07-08 VITALS — BP 120/78 | Ht 67.0 in | Wt 192.0 lb

## 2019-07-08 DIAGNOSIS — G43909 Migraine, unspecified, not intractable, without status migrainosus: Secondary | ICD-10-CM | POA: Insufficient documentation

## 2019-07-08 DIAGNOSIS — F411 Generalized anxiety disorder: Secondary | ICD-10-CM

## 2019-07-08 DIAGNOSIS — G8929 Other chronic pain: Secondary | ICD-10-CM | POA: Diagnosis not present

## 2019-07-08 DIAGNOSIS — M545 Low back pain, unspecified: Secondary | ICD-10-CM

## 2019-07-08 DIAGNOSIS — G43009 Migraine without aura, not intractable, without status migrainosus: Secondary | ICD-10-CM | POA: Diagnosis not present

## 2019-07-08 MED ORDER — SERTRALINE HCL 50 MG PO TABS: 50.0000 mg | ORAL_TABLET | Freq: Every day | ORAL | 3 refills | Status: DC

## 2019-07-08 MED ORDER — GABAPENTIN 100 MG PO CAPS: 100.0000 mg | ORAL_CAPSULE | Freq: Every day | ORAL | 3 refills | Status: DC

## 2019-07-08 MED ORDER — MELOXICAM 7.5 MG PO TABS: 7.5000 mg | ORAL_TABLET | Freq: Every day | ORAL | 0 refills | Status: DC | PRN

## 2019-07-08 MED ORDER — TOPIRAMATE 25 MG PO TABS: 25.0000 mg | ORAL_TABLET | Freq: Every day | ORAL | 1 refills | Status: DC

## 2019-07-08 NOTE — Patient Instructions (Addendum)
Please drop off xray reports here at the office so I can take look.   May stop ibuprofen.  A couple of points in regards to meloxicam ( Mobic) which you are starting.   This medication is not intended for daily , long term use. It is a potent anti inflammatory ( NSAID), and my intention is for you take as needed for moderate to severe pain. If you find yourself using daily, please let me know.   Please takes Mobic ( meloxicam) with FOOD since it is an anti-inflammatory as it can cause a GI bleed or ulcer. If you have a history of GI bleed or ulcer, please do NOT take.  Do no take over the counter aleve, motrin, advil, goody's powder for pain as they are also NSAIDs, and they are  in the same class as Mobic  Lastly, we will need to monitor kidney function while on Mobic and if we were to see any decline in kidney function in the future, we would have to discontinue this medication.    Start gabapentin; we may need to increase.   May try salon pas pain patch , BioFreeze which are over the counter.   Start zoloft.   Today we discussed referrals, orders.PT    I have placed these orders in the system for you.  Please be sure to give [place] a call if you have not heard from our office regarding this. We should hear from [place] within ONE week with information regarding your appointment. If not, please let me know immediately.     Follow up in one month with Philis Nettle, NP, sooner if any concerns!  Nice to meet you!

## 2019-07-08 NOTE — Assessment & Plan Note (Addendum)
Patient will drop off x-rays.  She states a history of degenerative disc disease, suspect line of work very much contributory. NO red flags at this time.  Advised physical therapy in lieu of chiropractor.  Will trial gabapentin low dose ( to increase at follow up likely) and meloxicam prn.  She will follow-up in 1 month.

## 2019-07-08 NOTE — Assessment & Plan Note (Signed)
Trial of zoloft; close f/u in one month.

## 2019-07-08 NOTE — Progress Notes (Signed)
This visit type was conducted due to national recommendations for restrictions regarding the COVID-19 pandemic (e.g. social distancing).  This format is felt to be most appropriate for this patient at this time.  All issues noted in this document were discussed and addressed.  No physical exam was performed (except for noted visual exam findings with Video Visits).  Virtual Visit via Video Note  I connected with@  on 07/08/19 at  2:00 PM EDT by a video enabled telemedicine application and verified that I am speaking with the correct person using two identifiers.  Location patient: home Location provider:work  Persons participating in the virtual visit: patient, provider  I discussed the limitations of evaluation and management by telemedicine and the availability of in person appointments. The patient expressed understanding and agreed to proceed.   HPI:  New patient.   Migraine- responds to topamax daily and will take tylenol for breakthrough. Takes topamax in the morning. Not sedating for her. Has been on medication for 2 years.  No vision loss, numbness to face. Has photophobia with HA, unilateral. HAs feel similar to HA's in the past.   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 Equities trader. Had xrays done this week with chiropracter.  States h/o DDD. Worse with activity however improves move moving around.  NO h/o cancer, trouble with bowel. No numbness, tingling, weakness in legs.  Taking 800mg  ibuprofen daily. No other arthralgia. No joint swelling, heat, redness  No ckd, autoimmune disease.   Increased anxiety with pandemic, children with online school. H/o of anxiety for years. No prior treatment with medication. No depression. No si/hi. No h/o suicidal attempts. Sleeping well.   Follows with OB GYN   ROS: See pertinent positives and negatives per HPI.  Past Medical History:  Diagnosis Date  .  Chronic migraine   . Complication of anesthesia   . DDD (degenerative disc disease), lumbosacral   . Family history of adverse reaction to anesthesia    Mother and Father - PONV  . PONV (postoperative nausea and vomiting)   . Wears contact lenses     Past Surgical History:  Procedure Laterality Date  . CHOLECYSTECTOMY    . PILONIDAL CYST EXCISION    . TONSILECTOMY, ADENOIDECTOMY, BILATERAL MYRINGOTOMY AND TUBES    . TYMPANOPLASTY Right 05/10/2019   Procedure: TYMPANOPLASTY;  Surgeon: Beverly Gust, MD;  Location: [facility];  Service: ENT;  Laterality: Right;    Family History  Problem Relation Age of Onset  . Brain cancer Father 86  . Valvular heart disease Sister   . Brain cancer Paternal Grandmother 48    SOCIAL HX: never smoker   Current Outpatient Medications:  .  albuterol (VENTOLIN HFA) 108 (90 Base) MCG/ACT inhaler, Inhale into the lungs every 6 (six) hours as needed for wheezing or shortness of breath., Disp: , Rfl:  .  Ascorbic Acid (VITAMIN C PO), Take by mouth., Disp: , Rfl:  .  azelastine (ASTELIN) 0.1 % nasal spray, Place 1 spray into both nostrils 2 (two) times daily. Use in each nostril as directed, Disp: 30 mL, Rfl: 0 .  Multiple Vitamins-Minerals (MULTIVITAL PO), Take by mouth., Disp: , Rfl:  .  topiramate (TOPAMAX) 25 MG tablet, Take 1 tablet (25 mg total) by mouth daily., Disp: 90 tablet, Rfl: 1 .  gabapentin (NEURONTIN) 100 MG capsule, Take 1 capsule (100 mg total) by mouth at bedtime., Disp: 90 capsule, Rfl: 3 .  meloxicam (MOBIC) 7.5 MG tablet, Take 1 tablet (7.5 mg total) by mouth daily as needed for pain., Disp: 90 tablet, Rfl: 0 .  sertraline (ZOLOFT) 50 MG tablet, Take 1 tablet (50 mg total) by mouth at bedtime., Disp: 90 tablet, Rfl: 3  EXAM:  VITALS per patient if applicable:  GENERAL: alert, oriented, appears well and in no acute distress  HEENT: atraumatic, conjunttiva clear, no obvious abnormalities on inspection of external nose  and ears  NECK: normal movements of the head and neck  LUNGS: on inspection no signs of respiratory distress, breathing rate appears normal, no obvious gross SOB, gasping or wheezing  CV: no obvious cyanosis  MS: moves all visible extremities without noticeable abnormality  PSYCH/NEURO: pleasant and cooperative, no obvious depression or anxiety, speech and thought processing grossly intact  ASSESSMENT AND PLAN:  Discussed the following assessment and plan:  Problem List Items Addressed This Visit      Cardiovascular and Mediastinum   Migraine    Stable at baseline. Doing well on topamax; will continue      Relevant Medications   gabapentin (NEURONTIN) 100 MG capsule   meloxicam (MOBIC) 7.5 MG tablet   sertraline (ZOLOFT) 50 MG tablet   topiramate (TOPAMAX) 25 MG tablet     Other   GAD (generalized anxiety disorder)    Trial of zoloft; close f/u in one month.      Relevant Medications   sertraline (ZOLOFT) 50 MG tablet   Chronic low back pain    Patient will drop off x-rays.  She states a history of degenerative disc disease, suspect line of work very much contributory. NO red flags at this time.  Advised physical therapy in lieu of chiropractor.  Will trial gabapentin low dose ( to increase at follow up likely) and meloxicam prn.  She will follow-up in 1 month.      Relevant Medications   meloxicam (MOBIC) 7.5 MG tablet    Other Visit Diagnoses    Midline low back pain without sciatica, unspecified chronicity    -  Primary   Relevant Medications   gabapentin (NEURONTIN) 100 MG capsule   meloxicam (MOBIC) 7.5 MG tablet   Other Relevant Orders   Ambulatory referral to Physical Therapy      I discussed the assessment and treatment plan with the patient. The patient was provided an opportunity to ask questions and all were answered. The patient agreed with the plan and demonstrated an understanding of the instructions.   The patient was advised to call back or seek  an in-person evaluation if the symptoms worsen or if the condition fails to improve as anticipated.   Mable Paris, FNP

## 2019-07-08 NOTE — Assessment & Plan Note (Signed)
Stable at baseline. Doing well on topamax; will continue

## 2019-07-24 ENCOUNTER — Ambulatory Visit: Payer: No Typology Code available for payment source | Admitting: Physical Therapy

## 2019-07-30 ENCOUNTER — Ambulatory Visit: Payer: No Typology Code available for payment source | Attending: Family | Admitting: Physical Therapy

## 2019-07-30 ENCOUNTER — Other Ambulatory Visit: Payer: Self-pay

## 2019-07-30 ENCOUNTER — Encounter: Payer: Self-pay | Admitting: Physical Therapy

## 2019-07-30 DIAGNOSIS — M546 Pain in thoracic spine: Secondary | ICD-10-CM | POA: Diagnosis present

## 2019-07-30 DIAGNOSIS — M545 Low back pain, unspecified: Secondary | ICD-10-CM

## 2019-07-30 DIAGNOSIS — M6281 Muscle weakness (generalized): Secondary | ICD-10-CM | POA: Diagnosis present

## 2019-07-30 DIAGNOSIS — G8929 Other chronic pain: Secondary | ICD-10-CM | POA: Insufficient documentation
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
## 2019-07-31 ENCOUNTER — Ambulatory Visit: Payer: No Typology Code available for payment source | Admitting: Physical Therapy

## 2019-08-07 ENCOUNTER — Ambulatory Visit: Payer: No Typology Code available for payment source | Admitting: Physical Therapy

## 2019-08-12 ENCOUNTER — Ambulatory Visit: Payer: No Typology Code available for payment source | Admitting: Physical Therapy

## 2019-08-12 ENCOUNTER — Other Ambulatory Visit: Payer: Self-pay

## 2019-08-12 DIAGNOSIS — M546 Pain in thoracic spine: Secondary | ICD-10-CM

## 2019-08-12 DIAGNOSIS — M6281 Muscle weakness (generalized): Secondary | ICD-10-CM

## 2019-08-12 DIAGNOSIS — M545 Low back pain, unspecified: Secondary | ICD-10-CM

## 2019-08-12 DIAGNOSIS — G8929 Other chronic pain: Secondary | ICD-10-CM
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
## 2019-08-13 ENCOUNTER — Encounter: Payer: Self-pay | Admitting: Physical Therapy

## 2019-08-16 ENCOUNTER — Other Ambulatory Visit: Payer: Self-pay

## 2019-08-16 ENCOUNTER — Ambulatory Visit: Payer: No Typology Code available for payment source | Attending: Family

## 2019-08-16 DIAGNOSIS — M6281 Muscle weakness (generalized): Secondary | ICD-10-CM | POA: Diagnosis present

## 2019-08-16 DIAGNOSIS — M546 Pain in thoracic spine: Secondary | ICD-10-CM | POA: Diagnosis present

## 2019-08-16 DIAGNOSIS — M545 Low back pain, unspecified: Secondary | ICD-10-CM

## 2019-08-16 DIAGNOSIS — G8929 Other chronic pain: Secondary | ICD-10-CM | POA: Insufficient documentation
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
## 2019-09-24 ENCOUNTER — Encounter: Payer: Self-pay | Admitting: Family

## 2019-09-24 NOTE — Telephone Encounter (Signed)
Since I have not seen her recently she would need to see me or someone here for [place] to give referral  LG

## 2019-09-27 ENCOUNTER — Ambulatory Visit: Payer: No Typology Code available for payment source | Admitting: Family Medicine

## 2019-09-27 ENCOUNTER — Ambulatory Visit
Admission: EM | Admit: 2019-09-27 | Discharge: 2019-09-27 | Disposition: A | Discharge status: Home / Self Care | Payer: No Typology Code available for payment source

## 2019-09-27 ENCOUNTER — Encounter: Payer: Self-pay | Admitting: *Deleted

## 2019-09-27 DIAGNOSIS — M25562 Pain in left knee: Secondary | ICD-10-CM

## 2019-09-27 DIAGNOSIS — R030 Elevated blood-pressure reading, without diagnosis of hypertension: Secondary | ICD-10-CM

## 2019-09-27 MED ORDER — IBUPROFEN 800 MG PO TABS: 800.0000 mg | ORAL_TABLET | Freq: Three times a day (TID) | ORAL | 0 refills | Status: DC | PRN

## 2019-09-27 NOTE — ED Triage Notes (Signed)
Patient reports left knee pain since halloween. States that she stepped off a stool and had a little bit of pain and it has continued to ache. Reports no pain with standing. Reports pain with extending knee when sitting. Has tried ace wraps and ice/heat. Reports ibuprofen does help.

## 2019-09-27 NOTE — ED Notes (Signed)
Left knee splint applied, no questions on how to apply at home.

## 2019-09-27 NOTE — Discharge Instructions (Addendum)
Take the ibuprofen as prescribed.  Rest and elevate your knee.  Apply ice packs 2-3 times a day for up to 20 minutes each.  Wear the knee sleeve as needed for comfort.    Follow up with the orthopedist listed below or your own orthopedist;  Call them to schedule an appointment.   Your blood pressure is elevated today at 132/95.  Please have this rechecked by your primary care provider in 2 weeks.

## 2019-09-27 NOTE — ED Provider Notes (Signed)
Roderic Palau    CSN: [id number] Arrival date & time: 09/27/19  1013      History   Chief Complaint Chief Complaint  Patient presents with  . Knee Pain    HPI Mary Richards is a 33 y.o. female.   Patient presents with a 2-week history of left knee pain.  She states it began when she stepped off a stool; she thinks she may have twisted it.  She has attempted treatment at home with Ace wrap, ice, heat, ibuprofen.  She reports that ibuprofen has helped.  She denies numbness, weakness, tingling.  The history is provided by the patient.    Past Medical History:  Diagnosis Date  . Chronic migraine   . Complication of anesthesia   . DDD (degenerative disc disease), lumbosacral   . Family history of adverse reaction to anesthesia    Mother and Father - PONV  . PONV (postoperative nausea and vomiting)   . Wears contact lenses     Patient Active Problem List   Diagnosis Date Noted  . GAD (generalized anxiety disorder) 07/08/2019  . Chronic low back pain 07/08/2019  . Migraine 07/08/2019    Past Surgical History:  Procedure Laterality Date  . CHOLECYSTECTOMY    . PILONIDAL CYST EXCISION    . TONSILECTOMY, ADENOIDECTOMY, BILATERAL MYRINGOTOMY AND TUBES    . TYMPANOPLASTY Right 05/10/2019   Procedure: TYMPANOPLASTY;  Surgeon: Beverly Gust, MD;  Location: [facility];  Service: ENT;  Laterality: Right;    OB History    Gravida  1   Para  1   Term  1   Preterm      AB      Living  1     SAB      TAB      Ectopic      Multiple      Live Births  1            Home Medications    Prior to Admission medications   Medication Sig Start Date End Date Taking? Authorizing Provider  cholecalciferol (VITAMIN D3) 25 MCG (1000 UT) tablet Take 1,000 Units by mouth daily.   Yes [provider]  albuterol (VENTOLIN HFA) 108 (90 Base) MCG/ACT inhaler Inhale into the lungs every 6 (six) hours as needed for wheezing or shortness of breath.     [provider]  Ascorbic Acid (VITAMIN C PO) Take by mouth.    [provider]  azelastine (ASTELIN) 0.1 % nasal spray Place 1 spray into both nostrils 2 (two) times daily. Use in each nostril as directed 11/01/18   Darlin Priestly, PA-C  gabapentin (NEURONTIN) 100 MG capsule Take 1 capsule (100 mg total) by mouth at bedtime. 07/08/19   Burnard Hawthorne, FNP  ibuprofen (ADVIL) 800 MG tablet Take 1 tablet (800 mg total) by mouth every 8 (eight) hours as needed. 09/27/19   Sharion Balloon, NP  meloxicam (MOBIC) 7.5 MG tablet Take 1 tablet (7.5 mg total) by mouth daily as needed for pain. 07/08/19   Burnard Hawthorne, FNP  Multiple Vitamins-Minerals (MULTIVITAL PO) Take by mouth.    [provider]  sertraline (ZOLOFT) 50 MG tablet Take 1 tablet (50 mg total) by mouth at bedtime. 07/08/19   Burnard Hawthorne, FNP  topiramate (TOPAMAX) 25 MG tablet Take 1 tablet (25 mg total) by mouth daily. 07/08/19   Burnard Hawthorne, FNP    Family History Family History  Problem Relation Age  of Onset  . Brain cancer Father 67  . Valvular heart disease Sister   . Brain cancer Paternal Grandmother 57    Social History Social History   Tobacco Use  . Smoking status: Never Smoker  . Smokeless tobacco: Never Used  Substance Use Topics  . Alcohol use: Yes    Alcohol/week: 2.0 standard drinks    Types: 2 Standard drinks or equivalent per week    Comment: Social   . Drug use: Never     Allergies   Azithromycin, Ciprofloxacin, Moxifloxacin hcl, Penicillins, and Bactrim [sulfamethoxazole-trimethoprim]   Review of Systems Review of Systems  Constitutional: Negative for chills and fever.  HENT: Negative for ear pain and sore throat.   Eyes: Negative for pain and visual disturbance.  Respiratory: Negative for cough and shortness of breath.   Cardiovascular: Negative for chest pain and palpitations.  Gastrointestinal: Negative for abdominal pain and vomiting.   Genitourinary: Negative for dysuria and hematuria.  Musculoskeletal: Positive for arthralgias. Negative for back pain.  Skin: Negative for color change and rash.  Neurological: Negative for seizures, syncope, weakness and numbness.  All other systems reviewed and are negative.    Physical Exam Triage Vital Signs ED Triage Vitals  Enc Vitals Group     BP      Pulse      Resp      Temp      Temp src      SpO2      Weight      Height      Head Circumference      Peak Flow      Pain Score      Pain Loc      Pain Edu?      Excl. in [place]?    No data found.  Updated Vital Signs BP (!) 132/95 (BP Location: Left Arm)   Pulse 77   Temp 98.3 F (36.8 C) (Oral)   Resp 16   LMP 09/09/2019   SpO2 97%   Visual Acuity Right Eye Distance:   Left Eye Distance:   Bilateral Distance:    Right Eye Near:   Left Eye Near:    Bilateral Near:     Physical Exam Vitals signs and nursing note reviewed.  Constitutional:      General: She is not in acute distress.    Appearance: She is well-developed.  HENT:     Head: Normocephalic and atraumatic.     Mouth/Throat:     Mouth: Mucous membranes are moist.  Eyes:     Conjunctiva/sclera: Conjunctivae normal.  Neck:     Musculoskeletal: Neck supple.  Cardiovascular:     Rate and Rhythm: Normal rate and regular rhythm.     Heart sounds: No murmur.  Pulmonary:     Effort: Pulmonary effort is normal. No respiratory distress.     Breath sounds: Normal breath sounds.  Abdominal:     Palpations: Abdomen is soft.     Tenderness: There is no abdominal tenderness. There is no guarding or rebound.  Musculoskeletal:        General: Swelling present. No tenderness or deformity.     Comments: Mild edema of left knee. FROM but reports discomfort with extension.    Skin:    General: Skin is warm and dry.     Capillary Refill: Capillary refill takes less than 2 seconds.     Findings: No bruising, erythema, lesion or rash.  Neurological:  General: No focal deficit present.     Mental Status: She is alert and oriented to person, place, and time.     Sensory: No sensory deficit.     Motor: No weakness.  Psychiatric:        Mood and Affect: Mood normal.        Behavior: Behavior normal.      UC Treatments / Results  Labs (all labs ordered are listed, but only abnormal results are displayed) Labs Reviewed - No data to display  EKG   Radiology No results found.  Procedures Procedures (including critical care time)  Medications Ordered in UC Medications - No data to display  Initial Impression / Assessment and Plan / UC Course  I have reviewed the triage vital signs and the nursing notes.  Pertinent labs & imaging results that were available during my care of the patient were reviewed by me and considered in my medical decision making (see chart for details).    Acute left knee pain.  Treating with ibuprofen, rest, elevation, ice, knee sleeve as needed for comfort.  Instructed patient to call the orthopedist to schedule an appointment.  Discussed with patient that her blood pressure is slightly elevated today and that this will need to be rechecked by her PCP in 2 to 4 weeks.  Patient agrees to plan of care.     Final Clinical Impressions(s) / UC Diagnoses   Final diagnoses:  Acute pain of left knee  Elevated blood pressure reading     Discharge Instructions     Take the ibuprofen as prescribed.  Rest and elevate your knee.  Apply ice packs 2-3 times a day for up to 20 minutes each.  Wear the knee sleeve as needed for comfort.    Follow up with the orthopedist listed below or your own orthopedist;  Call them to schedule an appointment.   Your blood pressure is elevated today at 132/95.  Please have this rechecked by your primary care provider in 2 weeks.       ED Prescriptions    Medication Sig Dispense Auth. Provider   ibuprofen (ADVIL) 800 MG tablet Take 1 tablet (800 mg total) by mouth every 8  (eight) hours as needed. 21 tablet Sharion Balloon, NP     PDMP not reviewed this encounter.   Sharion Balloon, NP 09/27/19 1051

## 2019-10-09 ENCOUNTER — Ambulatory Visit
Admission: RE | Admit: 2019-10-09 | Discharge: 2019-10-09 | Disposition: A | Discharge status: Home / Self Care | Payer: No Typology Code available for payment source | Source: Ambulatory Visit

## 2019-10-09 DIAGNOSIS — W57XXXA Bitten or stung by nonvenomous insect and other nonvenomous arthropods, initial encounter: Secondary | ICD-10-CM | POA: Diagnosis not present

## 2019-10-09 DIAGNOSIS — S90862A Insect bite (nonvenomous), left foot, initial encounter: Secondary | ICD-10-CM

## 2019-10-09 MED ORDER — DOXYCYCLINE HYCLATE 100 MG PO CAPS: 100.0000 mg | ORAL_CAPSULE | Freq: Two times a day (BID) | ORAL | 0 refills | Status: DC

## 2019-10-09 MED ORDER — PREDNISONE 20 MG PO TABS: 20.0000 mg | ORAL_TABLET | Freq: Every day | ORAL | 0 refills | Status: AC

## 2019-10-09 NOTE — Discharge Instructions (Signed)
Ice and elevation to help with swelling.  Ibuprofen as needed.  Activity as tolerated.  Daily antihistamine such as zyrtec/claritin or benadryl at regular dosing.  Three days of prednisone to help with allergic response.  We will cover for infection with antibiotics as well.  If any worsening or no improvement please be seen in person.

## 2019-10-09 NOTE — ED Provider Notes (Signed)
Virtual Visit via Video Note:  [name]  initiated request for Telemedicine visit with [facility] team. I connected with [name]  on 10/09/2019 at 1:42 PM  for a synchronized telemedicine visit using a video enabled HIPPA compliant telemedicine application. I verified that I am speaking with [name]  using two identifiers. Zigmund Gottron, NP  was physically located in a [facility] Urgent care site and Alandrea Suleman was located at a different location.   The limitations of evaluation and management by telemedicine as well as the availability of in-person appointments were discussed. Patient was informed that she  may incur a bill ( including co-pay) for this virtual visit encounter. Allyssa Wayment  expressed understanding and gave verbal consent to proceed with virtual visit.     History of Present Illness:Mary Richards  is a 33 y.o. female presents with complaints of bug bite to dorsal aspect of left foot. Initially noted affected area three days ago. The bite mark is now very painful and red. Firmness to the redness. Started out itching, no longer itches. There is a puncture site. Even appearance of bruising. Pain with the bend of toes, such as with walking. Was outside at the time of initial incident and felt a bite/sting sensation, she did strike at a bug on the foot but didn't get a good look at the bug. Has taken some benadryl which has helped with itching. Did take some ibuprofen which did help some.    Past Medical History:  Diagnosis Date  . Chronic migraine   . Complication of anesthesia   . DDD (degenerative disc disease), lumbosacral   . Family history of adverse reaction to anesthesia    Mother and Father - PONV  . PONV (postoperative nausea and vomiting)   . Wears contact lenses     Allergies  Allergen Reactions  . Azithromycin Hives and Shortness Of Breath  . Ciprofloxacin Swelling  . Moxifloxacin Hcl Hives  . Penicillins Hives  . Bactrim  [Sulfamethoxazole-Trimethoprim] Rash        Observations/Objective: Alert, oriented, non toxic in appearance. Clear coherent speech without difficulty. No increased work of breathing visualized.  Redness and swelling noted to dorsum of left foot extending to MTP joints of 2-4th toes, pinpoint puncture from presumed bug bite visible; no drainage or apparent abscess presence   Assessment and Plan: Bug bite with allergic response vs addition of infection. Prednisone as well as antibiotics provided. Encouraged in person visit if no improvement or any worsening. Patient verbalized understanding and agreeable to plan.     Follow Up Instructions:    I discussed the assessment and treatment plan with the patient. The patient was provided an opportunity to ask questions and all were answered. The patient agreed with the plan and demonstrated an understanding of the instructions.   The patient was advised to call back or seek an in-person evaluation if the symptoms worsen or if the condition fails to improve as anticipated.  I provided 15 minutes of non-face-to-face time during this encounter.    Zigmund Gottron, NP  10/09/2019 1:42 PM         Zigmund Gottron, NP 10/09/19 1352

## 2019-10-30 NOTE — Telephone Encounter (Signed)
Patient is schedule 10/31/19

## 2019-10-30 NOTE — Telephone Encounter (Signed)
Please schedule her for a virtual visit then- Thank you Katelyn.

## 2019-10-31 ENCOUNTER — Ambulatory Visit (INDEPENDENT_AMBULATORY_CARE_PROVIDER_SITE_OTHER): Payer: No Typology Code available for payment source | Admitting: Obstetrics and Gynecology

## 2019-10-31 ENCOUNTER — Encounter: Payer: Self-pay | Admitting: Obstetrics and Gynecology

## 2019-10-31 ENCOUNTER — Other Ambulatory Visit: Payer: Self-pay

## 2019-10-31 DIAGNOSIS — Z3009 Encounter for other general counseling and advice on contraception: Secondary | ICD-10-CM | POA: Diagnosis not present

## 2019-10-31 DIAGNOSIS — N644 Mastodynia: Secondary | ICD-10-CM | POA: Diagnosis not present

## 2019-10-31 MED ORDER — LEVONORGEST-ETH ESTRAD 91-DAY 0.15-0.03 &0.01 MG PO TABS: 1.0000 | ORAL_TABLET | Freq: Every day | ORAL | 4 refills | Status: DC

## 2019-10-31 NOTE — Progress Notes (Signed)
Virtual Visit via Telephone Note  I connected with Mary Richards on 10/31/19 at 11:10 AM EST by telephone and verified that I am speaking with the correct person using two identifiers.   I discussed the limitations, risks, security and privacy concerns of performing an evaluation and management service by telephone and the availability of in person appointments. I also discussed with the patient that there may be a patient responsible charge related to this service. The patient expressed understanding and agreed to proceed.  The patient was at home I spoke with the patient from my  office The names of people involved in this encounter were: Dr. Gilman Schmidt and [name].   History of Present Illness:  She reports that she has always had breast pain the week before her period.  Once her period starts that pain resolves quickly.  The breast pain has become excruciating. She can't sleep because of the pain. She has tried wearing a tight bra and taking NSAIDs little improvement. She can barely touch her breasts without pain.  Warm and cold compresses has provided about 30 minutes or relief. Pain is bilateral and under armpits and radiates to the sides of her breasts.  When she sleeps at nights and rolls over it is painful,  She has a C-cup size breast.  She has used nuvaring before, had a lot of spotting with this method.  She has been using condoms for 5-6 years. She has complete her family.  She drinks about 1 cup of coffee every 2-3 days.  LMP: 10/30/2019  Observations/Objective:   Physical Exam could not be performed. Because of the COVID-19 outbreak this visit was performed over the phone and not in person.   Assessment and Plan: 33 yo with cyclical breast pain She understands that OCP have not been proven to improve breast pain and that they may worsen breast pain. She would like to attempt a trial of OCP to see if she can have improvement in her breast symptoms.  She would like to  proceed with a trial ofextended cycle OCP to see if she can have improvement in her symptoms. Discussed other alternatives treatments such as tamoxifen if symptoms continue.   Follow Up Instructions: 3 months PRN   I discussed the assessment and treatment plan with the patient. The patient was provided an opportunity to ask questions and all were answered. The patient agreed with the plan and demonstrated an understanding of the instructions.   The patient was advised to call back or seek an in-person evaluation if the symptoms worsen or if the condition fails to improve as anticipated.  I provided 13 minutes of non-face-to-face time during this encounter.  Adrian Prows MD [facility], [facility] 10/31/2019 10:56 AM

## 2019-11-12 ENCOUNTER — Other Ambulatory Visit: Payer: Self-pay | Admitting: Physician Assistant

## 2019-11-12 DIAGNOSIS — M25562 Pain in left knee: Secondary | ICD-10-CM

## 2019-11-20 ENCOUNTER — Other Ambulatory Visit: Payer: Self-pay

## 2019-11-20 ENCOUNTER — Encounter: Payer: No Typology Code available for payment source | Admitting: Family

## 2019-11-20 ENCOUNTER — Ambulatory Visit
Admission: RE | Admit: 2019-11-20 | Discharge: 2019-11-20 | Disposition: A | Discharge status: Home / Self Care | Payer: No Typology Code available for payment source | Source: Ambulatory Visit | Attending: Physician Assistant | Admitting: Physician Assistant

## 2019-11-20 DIAGNOSIS — M25562 Pain in left knee: Secondary | ICD-10-CM | POA: Insufficient documentation

## 2019-12-10 ENCOUNTER — Encounter: Payer: Self-pay | Admitting: Physical Therapy

## 2019-12-10 DIAGNOSIS — M6281 Muscle weakness (generalized): Secondary | ICD-10-CM

## 2019-12-10 DIAGNOSIS — G8929 Other chronic pain: Secondary | ICD-10-CM

## 2019-12-10 DIAGNOSIS — M546 Pain in thoracic spine: Secondary | ICD-10-CM
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
## 2019-12-11 ENCOUNTER — Encounter: Payer: No Typology Code available for payment source | Admitting: Family

## 2019-12-12 ENCOUNTER — Other Ambulatory Visit: Payer: Self-pay

## 2019-12-12 ENCOUNTER — Other Ambulatory Visit: Payer: Self-pay | Admitting: Family

## 2019-12-12 DIAGNOSIS — M545 Low back pain, unspecified: Secondary | ICD-10-CM

## 2019-12-17 ENCOUNTER — Other Ambulatory Visit: Payer: Self-pay

## 2019-12-17 ENCOUNTER — Encounter: Payer: Self-pay | Admitting: Family

## 2019-12-17 ENCOUNTER — Ambulatory Visit (INDEPENDENT_AMBULATORY_CARE_PROVIDER_SITE_OTHER): Payer: No Typology Code available for payment source | Admitting: Family

## 2019-12-17 ENCOUNTER — Telehealth: Payer: Self-pay | Admitting: Family

## 2019-12-17 VITALS — BP 98/62 | HR 80 | Temp 97.9°F | Ht 66.5 in | Wt 189.4 lb

## 2019-12-17 DIAGNOSIS — R6889 Other general symptoms and signs: Secondary | ICD-10-CM | POA: Diagnosis not present

## 2019-12-17 DIAGNOSIS — G43009 Migraine without aura, not intractable, without status migrainosus: Secondary | ICD-10-CM | POA: Diagnosis not present

## 2019-12-17 DIAGNOSIS — M545 Low back pain, unspecified: Secondary | ICD-10-CM

## 2019-12-17 DIAGNOSIS — Z0000 Encounter for general adult medical examination without abnormal findings: Secondary | ICD-10-CM | POA: Diagnosis not present

## 2019-12-17 DIAGNOSIS — F411 Generalized anxiety disorder: Secondary | ICD-10-CM | POA: Diagnosis not present

## 2019-12-17 DIAGNOSIS — R899 Unspecified abnormal finding in specimens from other organs, systems and tissues: Secondary | ICD-10-CM

## 2019-12-17 DIAGNOSIS — G8929 Other chronic pain: Secondary | ICD-10-CM

## 2019-12-17 LAB — CBC WITH DIFFERENTIAL/PLATELET
Basophils Absolute: 0 10*3/uL (ref 0.0–0.1)
Basophils Relative: 0.3 % (ref 0.0–3.0)
Eosinophils Absolute: 0.3 10*3/uL (ref 0.0–0.7)
Eosinophils Relative: 2.7 % (ref 0.0–5.0)
HCT: 43.9 % (ref 36.0–46.0)
Hemoglobin: 14.7 g/dL (ref 12.0–15.0)
Lymphocytes Relative: 31.6 % (ref 12.0–46.0)
Lymphs Abs: 3.6 10*3/uL (ref 0.7–4.0)
MCHC: 33.6 g/dL (ref 30.0–36.0)
MCV: 92.5 fl (ref 78.0–100.0)
Monocytes Absolute: 0.7 10*3/uL (ref 0.1–1.0)
Monocytes Relative: 5.8 % (ref 3.0–12.0)
Neutro Abs: 6.7 10*3/uL (ref 1.4–7.7)
Neutrophils Relative %: 59.6 % (ref 43.0–77.0)
Platelets: 356 10*3/uL (ref 150.0–400.0)
RBC: 4.74 Mil/uL (ref 3.87–5.11)
RDW: 12.9 % (ref 11.5–15.5)
WBC: 11.3 10*3/uL — ABNORMAL HIGH (ref 4.0–10.5)

## 2019-12-17 LAB — HEMOGLOBIN A1C: Hgb A1c MFr Bld: 5.4 % (ref 4.6–6.5)

## 2019-12-17 LAB — IBC + FERRITIN
Ferritin: 48.6 ng/mL (ref 10.0–291.0)
Iron: 182 ug/dL — ABNORMAL HIGH (ref 42–145)
Saturation Ratios: 55.3 % — ABNORMAL HIGH (ref 20.0–50.0)
Transferrin: 235 mg/dL (ref 212.0–360.0)

## 2019-12-17 LAB — LIPID PANEL
Cholesterol: 158 mg/dL (ref 0–200)
HDL: 48.8 mg/dL (ref >39.00)
LDL Cholesterol: 90 mg/dL (ref 0–99)
NonHDL: 109.56
Total CHOL/HDL Ratio: 3
Triglycerides: 97 mg/dL (ref 0.0–149.0)
VLDL: 19.4 mg/dL (ref 0.0–40.0)

## 2019-12-17 LAB — COMPREHENSIVE METABOLIC PANEL
ALT: 16 U/L (ref 0–35)
AST: 13 U/L (ref 0–37)
Albumin: 4.1 g/dL (ref 3.5–5.2)
Alkaline Phosphatase: 53 U/L (ref 39–117)
BUN: 19 mg/dL (ref 6–23)
CO2: 24 mEq/L (ref 19–32)
Calcium: 9 mg/dL (ref 8.4–10.5)
Chloride: 107 mEq/L (ref 96–112)
Creatinine, Ser: 0.75 mg/dL (ref 0.40–1.20)
GFR: 89.3 mL/min (ref >60.00)
Glucose, Bld: 77 mg/dL (ref 70–99)
Potassium: 4.4 mEq/L (ref 3.5–5.1)
Sodium: 137 mEq/L (ref 135–145)
Total Bilirubin: 0.9 mg/dL (ref 0.2–1.2)
Total Protein: 6.3 g/dL (ref 6.0–8.3)

## 2019-12-17 LAB — VITAMIN D 25 HYDROXY (VIT D DEFICIENCY, FRACTURES): VITD: 32.13 ng/mL (ref 30.00–100.00)

## 2019-12-17 LAB — TSH: TSH: 1.37 u[IU]/mL (ref 0.35–4.50)

## 2019-12-17 MED ORDER — TRAZODONE HCL 50 MG PO TABS: 25.0000 mg | ORAL_TABLET | Freq: Every evening | ORAL | 0 refills | Status: DC | PRN

## 2019-12-17 NOTE — Patient Instructions (Signed)
Stay safe!  Please let us know if you do not hear from [place] in regards to allergy and dermatology referral  I will let you know once I hear from pharmacy in regards to trazodone and if you can take.    Health Maintenance, Female Adopting a healthy lifestyle and getting preventive care are important in promoting health and wellness. Ask your health care provider about:  The right schedule for you to have regular tests and exams.  Things you can do on your own to prevent diseases and keep yourself healthy. What should I know about diet, weight, and exercise? Eat a healthy diet   Eat a diet that includes plenty of vegetables, fruits, low-fat dairy products, and lean protein.  Do not eat a lot of foods that are high in solid fats, added sugars, or sodium. Maintain a healthy weight Body mass index (BMI) is used to identify weight problems. It estimates body fat based on height and weight. Your health care provider can help determine your BMI and help you achieve or maintain a healthy weight. Get regular exercise Get regular exercise. This is one of the most important things you can do for your health. Most adults should:  Exercise for at least 150 minutes each week. The exercise should increase your heart rate and make you sweat (moderate-intensity exercise).  Do strengthening exercises at least twice a week. This is in addition to the moderate-intensity exercise.  Spend less time sitting. Even light physical activity can be beneficial. Watch cholesterol and blood lipids Have your blood tested for lipids and cholesterol at 33 years of age, then have this test every 5 years. Have your cholesterol levels checked more often if:  Your lipid or cholesterol levels are high.  You are older than 33 years of age.  You are at high risk for heart disease. What should I know about cancer screening? Depending on your health history and family history, you may need to have cancer screening at  various ages. This may include screening for:  Breast cancer.  Cervical cancer.  Colorectal cancer.  Skin cancer.  Lung cancer. What should I know about heart disease, diabetes, and high blood pressure? Blood pressure and heart disease  High blood pressure causes heart disease and increases the risk of stroke. This is more likely to develop in people who have high blood pressure readings, are of African descent, or are overweight.  Have your blood pressure checked: ? Every 3-5 years if you are 74-79 years of age. ? Every year if you are 38 years old or older. Diabetes Have regular diabetes screenings. This checks your fasting blood sugar level. Have the screening done:  Once every three years after age 4 if you are at a normal weight and have a low risk for diabetes.  More often and at a younger age if you are overweight or have a high risk for diabetes. What should I know about preventing infection? Hepatitis B If you have a higher risk for hepatitis B, you should be screened for this virus. Talk with your health care provider to find out if you are at risk for hepatitis B infection. Hepatitis C Testing is recommended for:  Everyone born from [birth year] through 1964.  Anyone with known risk factors for hepatitis C. Sexually transmitted infections (STIs)  Get screened for STIs, including gonorrhea and chlamydia, if: ? You are sexually active and are younger than 33 years of age. ? You are older than 33 years of age  and your health care provider tells you that you are at risk for this type of infection. ? Your sexual activity has changed since you were last screened, and you are at increased risk for chlamydia or gonorrhea. Ask your health care provider if you are at risk.  Ask your health care provider about whether you are at high risk for HIV. Your health care provider may recommend a prescription medicine to help prevent HIV infection. If you choose to take medicine to prevent  HIV, you should first get tested for HIV. You should then be tested every 3 months for as long as you are taking the medicine. Pregnancy  If you are about to stop having your period (premenopausal) and you may become pregnant, seek counseling before you get pregnant.  Take 400 to 800 micrograms (mcg) of folic acid every day if you become pregnant.  Ask for birth control (contraception) if you want to prevent pregnancy. Osteoporosis and menopause Osteoporosis is a disease in which the bones lose minerals and strength with aging. This can result in bone fractures. If you are 67 years old or older, or if you are at risk for osteoporosis and fractures, ask your health care provider if you should:  Be screened for bone loss.  Take a calcium or vitamin D supplement to lower your risk of fractures.  Be given hormone replacement therapy (HRT) to treat symptoms of menopause. Follow these instructions at home: Lifestyle  Do not use any products that contain nicotine or tobacco, such as cigarettes, e-cigarettes, and chewing tobacco. If you need help quitting, ask your health care provider.  Do not use street drugs.  Do not share needles.  Ask your health care provider for help if you need support or information about quitting drugs. Alcohol use  Do not drink alcohol if: ? Your health care provider tells you not to drink. ? You are pregnant, may be pregnant, or are planning to become pregnant.  If you drink alcohol: ? Limit how much you use to 0-1 drink a day. ? Limit intake if you are breastfeeding.  Be aware of how much alcohol is in your drink. In the [place] one drink equals one 12 oz bottle of beer (355 mL), one 5 oz glass of wine (148 mL), or one 1 oz glass of hard liquor (44 mL). General instructions  Schedule regular health, dental, and eye exams.  Stay current with your vaccines.  Tell your health care provider if: ? You often feel depressed. ? You have ever been abused or do  not feel safe at home. Summary  Adopting a healthy lifestyle and getting preventive care are important in promoting health and wellness.  Follow your health care provider's instructions about healthy diet, exercising, and getting tested or screened for diseases.  Follow your health care provider's instructions on monitoring your cholesterol and blood pressure. This information is not intended to replace advice given to you by your health care provider. Make sure you discuss any questions you have with your health care provider. Document Revised: 10/24/2018 Document Reviewed: 10/24/2018 Elsevier Patient Education  2020 [employer].

## 2019-12-17 NOTE — Telephone Encounter (Signed)
LM that trazodone was not an interaction with allergies & trazodone was sent.

## 2019-12-17 NOTE — Progress Notes (Signed)
Subjective:    Patient ID: Mary Richards, female    DOB: 1986-04-07, 34 y.o.   MRN: [id number]  CC: Mary Richards is a 34 y.o. female who presents today for physical exam.    HPI: Presents today for her annual physical.  She has concerns  that she "stays cold all the time".  This has been going on for years.  She states she also notices it in the summer months.  She also endorses fatigue over the past few months.  She states her sleep is a little bit erratic ; sometimes after work she is a hard time falling asleep.  Other nights when she is really tired she falls asleep rather quickly when she takes gabapentin and Topamax.  She does  Think that anxiety contributory to poor sleep.  She stopped Zoloft due to side effects , weird dreams. She is currently working as a Quarry manager in ICU.  She also states headaches have been much improved on Topamax.  In the past she could not function with the headaches and now "I do not ever have a headache".  Denies vision changes or aura.  Low back pain-this is improved and she is doing well on meloxicam, gabapentin.  No weakness or numbness in her legs.  Recently she has been treated for left knee pain with a steroid injection and is taking meloxicam daily for this.  She is currently not taking ibuprofen  She is recently start working out again, and has lost 5 pounds in regards to this.  she would also like a referral dermatology for annual skin check and also for a lesion on the left cheek which she covers up with make-up. She would also like a referral to allergy for evaluation for antibiotic reactions   Colorectal Cancer Screening: No early family history Breast Cancer Screening: No early family history Cervical Cancer Screening: UTD        Tetanus - due        HIV Screening- Candidate for  Labs: Screening labs today. Exercise: Gets regular exercise.  Alcohol use: occasional Smoking/tobacco use: Nonsmoker.    HISTORY:  Past Medical History:    Diagnosis Date  . Chronic migraine   . Complication of anesthesia   . DDD (degenerative disc disease), lumbosacral   . Family history of adverse reaction to anesthesia    Mother and Father - PONV  . PONV (postoperative nausea and vomiting)   . Wears contact lenses     Past Surgical History:  Procedure Laterality Date  . CHOLECYSTECTOMY    . PILONIDAL CYST EXCISION    . TONSILECTOMY, ADENOIDECTOMY, BILATERAL MYRINGOTOMY AND TUBES    . TYMPANOPLASTY Right 05/10/2019   Procedure: TYMPANOPLASTY;  Surgeon: Beverly Gust, MD;  Location: [facility];  Service: ENT;  Laterality: Right;   Family History  Problem Relation Age of Onset  . Brain cancer Father 76  . Valvular heart disease Sister   . Brain cancer Paternal Grandmother 40      ALLERGIES: Azithromycin, Ciprofloxacin, Moxifloxacin hcl, Penicillins, and Bactrim [sulfamethoxazole-trimethoprim]  Current Outpatient Medications on File Prior to Visit  Medication Sig Dispense Refill  . albuterol (VENTOLIN HFA) 108 (90 Base) MCG/ACT inhaler Inhale into the lungs every 6 (six) hours as needed for wheezing or shortness of breath.    Marland Kitchen azelastine (ASTELIN) 0.1 % nasal spray Place 1 spray into both nostrils 2 (two) times daily. Use in each nostril as directed 30 mL 0  . cholecalciferol (VITAMIN D3) 25 MCG (1000  UT) tablet Take 1,000 Units by mouth daily.    Marland Kitchen gabapentin (NEURONTIN) 100 MG capsule Take 1 capsule (100 mg total) by mouth at bedtime. 90 capsule 3  . ibuprofen (ADVIL) 800 MG tablet Take 1 tablet (800 mg total) by mouth every 8 (eight) hours as needed. 21 tablet 0  . Levonorgestrel-Ethinyl Estradiol (AMETHIA) 0.15-0.03 &0.01 MG tablet Take 1 tablet by mouth daily. 1 Package 4  . meloxicam (MOBIC) 7.5 MG tablet TAKE 1 TABLET (7.5 MG TOTAL) BY MOUTH DAILY AS NEEDED FOR PAIN. 90 tablet 0  . Multiple Vitamin (MULTI-VITAMIN) tablet Take by mouth.    . topiramate (TOPAMAX) 25 MG tablet Take 1 tablet (25 mg total) by mouth  daily. 90 tablet 1  . Ascorbic Acid (VITAMIN C PO) Take by mouth.     No current facility-administered medications on file prior to visit.    Social History   Tobacco Use  . Smoking status: Never Smoker  . Smokeless tobacco: Never Used  Substance Use Topics  . Alcohol use: Yes    Alcohol/week: 2.0 standard drinks    Types: 2 Standard drinks or equivalent per week    Comment: Social   . Drug use: Never    Review of Systems  Constitutional: Positive for fatigue. Negative for chills, fever and unexpected weight change.  HENT: Negative for congestion.   Respiratory: Negative for cough.   Cardiovascular: Negative for chest pain, palpitations and leg swelling.  Gastrointestinal: Negative for nausea and vomiting.  Endocrine: Positive for cold intolerance. Negative for heat intolerance.  Musculoskeletal: Negative for arthralgias and myalgias.  Skin: Negative for rash.  Neurological: Negative for weakness and headaches (resolved).  Hematological: Negative for adenopathy.  Psychiatric/Behavioral: Negative for confusion.      Objective:    BP 98/62   Pulse 80   Temp 97.9 F (36.6 C) (Temporal)   Ht 5' 6.5" (1.689 m)   Wt 189 lb 6.4 oz (85.9 kg)   SpO2 99%   BMI 30.11 kg/m   BP Readings from Last 3 Encounters:  12/17/19 98/62  09/27/19 (!) 132/95  07/08/19 120/78   Wt Readings from Last 3 Encounters:  12/17/19 189 lb 6.4 oz (85.9 kg)  07/08/19 192 lb (87.1 kg)  05/10/19 192 lb (87.1 kg)    Physical Exam Vitals reviewed.  Constitutional:      Appearance: She is well-developed.  Eyes:     Conjunctiva/sclera: Conjunctivae normal.  Neck:     Thyroid: No thyroid mass or thyromegaly.  Cardiovascular:     Rate and Rhythm: Normal rate and regular rhythm.     Pulses: Normal pulses.     Heart sounds: Normal heart sounds.  Pulmonary:     Effort: Pulmonary effort is normal.     Breath sounds: Normal breath sounds. No wheezing, rhonchi or rales.  Chest:     Breasts:  Breasts are symmetrical.        Right: No inverted nipple, mass, nipple discharge, skin change or tenderness.        Left: No inverted nipple, mass, nipple discharge, skin change or tenderness.  Lymphadenopathy:     Head:     Right side of head: No submental, submandibular, tonsillar, preauricular, posterior auricular or occipital adenopathy.     Left side of head: No submental, submandibular, tonsillar, preauricular, posterior auricular or occipital adenopathy.     Cervical: No cervical adenopathy.     Right cervical: No superficial, deep or posterior cervical adenopathy.    Left cervical:  No superficial, deep or posterior cervical adenopathy.  Skin:    General: Skin is warm and dry.  Neurological:     Mental Status: She is alert.  Psychiatric:        Speech: Speech normal.        Behavior: Behavior normal.        Thought Content: Thought content normal.        Assessment & Plan:   Problem List Items Addressed This Visit      Cardiovascular and Mediastinum   Migraine    Improved/resolved on topamax. Continue to follow.        Other   Chronic low back pain    Stable. Continue current regimen. Advised NOT to mobic QD and to use prn.       Cold intolerance    Etiology nonspecific at this time.  Pending CBC, iron stores, TSH.      GAD (generalized anxiety disorder)    Anxiety affects sleep. Discussed prn therapy with trazodone. Will await to see if Catie PharmD says safe in regards to her drug allergies.      Routine physical examination - Primary    CBE performed. Deferred pelvic as she follows with GYN.        Relevant Orders   Ambulatory referral to Dermatology   Ambulatory referral to Allergy   TSH   CBC with Differential/Platelet   Comprehensive metabolic panel   Hemoglobin A1c   Lipid panel   VITAMIN D 25 Hydroxy (Vit-D Deficiency, Fractures)   IBC + Ferritin       I am having Elana Sedeno maintain her azelastine, albuterol, Ascorbic Acid (VITAMIN C  PO), gabapentin, topiramate, cholecalciferol, ibuprofen, Multi-Vitamin, Levonorgestrel-Ethinyl Estradiol, and meloxicam.   No orders of the defined types were placed in this encounter.   Return precautions given.   Risks, benefits, and alternatives of the medications and treatment plan prescribed today were discussed, and patient expressed understanding.   Education regarding symptom management and diagnosis given to patient on AVS.   Continue to follow with Burnard Hawthorne, FNP for routine health maintenance.   [name] and I agreed with plan.   Mable Paris, FNP

## 2019-12-17 NOTE — Assessment & Plan Note (Addendum)
Improved/resolved on topamax. Continue to follow.

## 2019-12-17 NOTE — Assessment & Plan Note (Signed)
CBE performed. Deferred pelvic as she follows with GYN.

## 2019-12-17 NOTE — Telephone Encounter (Signed)
Call pt Mary Richards pharmd didn't see any problems with trazodone based on her antibiotic allergies  I have sent in Please ensure she has f/u 3-6 months so we can see how she is doing on new medication

## 2019-12-17 NOTE — Assessment & Plan Note (Signed)
Etiology nonspecific at this time.  Pending CBC, iron stores, TSH.

## 2019-12-17 NOTE — Assessment & Plan Note (Signed)
Anxiety affects sleep. Discussed prn therapy with trazodone. Will await to see if Catie PharmD says safe in regards to her drug allergies.

## 2019-12-17 NOTE — Assessment & Plan Note (Signed)
Stable. Continue current regimen. Advised NOT to mobic QD and to use prn.

## 2019-12-18 NOTE — Addendum Note (Signed)
Addended by: Burnard Hawthorne on: 12/18/2019 08:50 PM   Modules accepted: Orders

## 2019-12-19 ENCOUNTER — Encounter: Payer: Self-pay | Admitting: Family

## 2019-12-31 ENCOUNTER — Other Ambulatory Visit: Payer: Self-pay

## 2019-12-31 ENCOUNTER — Encounter: Payer: Self-pay | Admitting: Allergy

## 2019-12-31 ENCOUNTER — Ambulatory Visit (INDEPENDENT_AMBULATORY_CARE_PROVIDER_SITE_OTHER): Payer: No Typology Code available for payment source | Admitting: Allergy

## 2019-12-31 VITALS — BP 116/68 | HR 76 | Temp 98.4°F | Resp 16 | Ht 66.0 in | Wt 193.0 lb

## 2019-12-31 DIAGNOSIS — J4520 Mild intermittent asthma, uncomplicated: Secondary | ICD-10-CM | POA: Diagnosis not present

## 2019-12-31 DIAGNOSIS — H109 Unspecified conjunctivitis: Secondary | ICD-10-CM | POA: Diagnosis not present

## 2019-12-31 DIAGNOSIS — J310 Chronic rhinitis: Secondary | ICD-10-CM | POA: Diagnosis not present

## 2019-12-31 DIAGNOSIS — T887XXA Unspecified adverse effect of drug or medicament, initial encounter: Secondary | ICD-10-CM | POA: Diagnosis not present

## 2019-12-31 NOTE — Progress Notes (Signed)
New Patient Note  RE: Mary Richards MRN: [id number] DOB: 09/06/1986 Date of Office Visit: 12/31/2019  Referring provider: Burnard Hawthorne, FNP Primary care provider: Burnard Hawthorne, FNP  Chief Complaint: Medication allergies  History of present illness: Mary Richards is a 34 y.o. female presenting today for consultation for history of drug allergies.  She has list of antibiotic medications that she is allergic to.  She would like to see if she truly has a drug allergy to these antibiotics at this time. She states her first reaction to an antibiotic was when she was 34yo to Avelox.   She states she had an eye infection that progressed and "got in my blood stream".  She states about 2 to 3 days into the antibiotic course she recalls developing hives.  The antibiotic was stopped.  She states with all of the reactions he has had besides the most recent one she has developed hives typically 2-3 doses or days into the medication regimen.  She states the hives usually have started on her hands, elbows or knees and then will spread to involve the entire body.  She states the hives have never started after the first dose.    She states in her early 82s she had an antibiotic that she states was likely a penicillin and again she developed hives about 2 to 3 days and and the medication was stopped.  She states she has had further medication reactions that seem to be more severe after she had her son about 6 or so years ago.  She states when she took Bactrim she developed a different type rash than she has had with her other medications.  This occurred about 4 to 6 years ago.  She states this rash was very "teeny tiny dots" on her hands and she did note some small peeling of the skin of the hands.  Denies any mucous membrane involvement.  Her most recent medication issue was about 2 to 3 years ago with azithromycin.  She states she had taken it before without any issue.    Again 2-3 doses then she  states she woke up and her palms were itchy initially and progressed.  She states the hives were worse in the skin creases.   She used benadryl and hydrocortisone cream.  The hives continued to spread throughout her body.  She also noted that her throat felt swollen and it hurt to swallow.  She also reports having hives all over her face as well.  She went to [facility] where she recalls that she got a steroid injection she believes was solumedrol.   She states it took 3 days for symptoms to resolve and she went back to UC and got another injection.  She also recalls being recommend to take zantac.    She states due to the Avelox issue but she was advised to avoid ciprofloxacin which is also been listed as an allergy.  She states her mom told her when she was a kid that when she had viruses she would have hives and they would go away.  She denies having hives at any other time without having an illness being treated for with antibiotics.  In Oct 2020 she was stung by fire ant on her foot and she had drastic foot swelling and it left a large bruise "under the skin".  She states she was treated with doxycyline as she states it looked like she developed a secondary skin infection.  She tolerated  the doxycycline well.  She states this is the only antibiotic that she has been able to use at this time.  Sh states she was told she had asthma after she had a bad chest cold with wheezing.  She was prescribed an albuterol inhaler for a respiratory illness about 5-6 years ago.  She only uses the albuterol during times of upper respiratory tract illnesses.  She reports seasonal allergies during spring and fall and will take allegra-D.  She reports symptoms of itchy eyes, watery nose, sneezing.    No history of eczema.     Review of systems: Review of Systems  Constitutional: Negative.   HENT: Negative.   Eyes: Negative.   Respiratory: Negative.   Cardiovascular: Negative.   Gastrointestinal: Negative.     Musculoskeletal: Negative.   Skin: Negative.   Neurological: Negative.     All other systems negative unless noted above in HPI  Past medical history: Past Medical History:  Diagnosis Date  . Asthma   . Chronic migraine   . Complication of anesthesia   . DDD (degenerative disc disease), lumbosacral   . Family history of adverse reaction to anesthesia    Mother and Father - PONV  . PONV (postoperative nausea and vomiting)   . Wears contact lenses     Past surgical history: Past Surgical History:  Procedure Laterality Date  . CHOLECYSTECTOMY    . PILONIDAL CYST EXCISION    . TONSILECTOMY, ADENOIDECTOMY, BILATERAL MYRINGOTOMY AND TUBES    . TYMPANOPLASTY Right 05/10/2019   Procedure: TYMPANOPLASTY;  Surgeon: Beverly Gust, MD;  Location: [place];  Service: ENT;  Laterality: Right;    Family history:  Family History  Problem Relation Age of Onset  . Brain cancer Father 48  . Valvular heart disease Sister   . Brain cancer Paternal Grandmother 43  . COPD Maternal Grandfather     Social history: She lives in a home with carpeting in the bedroom with gas and heat pump heating and central cooling.  There is a cat in the home.  Dog outside the home.  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  She denies a smoking history.  Medication List: Current Outpatient Medications  Medication Sig Dispense Refill  . azelastine (ASTELIN) 0.1 % nasal spray Place 1 spray into both nostrils 2 (two) times daily. Use in each nostril as directed 30 mL 0  . Biotin 1000 MCG tablet Take 1,000 mcg by mouth daily.    . fexofenadine (ALLEGRA) 180 MG tablet Take 180 mg by mouth daily as needed.    . gabapentin (NEURONTIN) 100 MG capsule Take 1 capsule (100 mg total) by mouth at bedtime. 90 capsule 3  . Levonorgestrel-Ethinyl Estradiol (AMETHIA) 0.15-0.03 &0.01 MG tablet Take 1 tablet by mouth daily. 1 Package 4  . meloxicam (MOBIC) 7.5 MG tablet TAKE 1 TABLET (7.5 MG TOTAL) BY MOUTH DAILY AS NEEDED FOR  PAIN. 90 tablet 0  . Multiple Vitamin (MULTI-VITAMIN) tablet Take by mouth.    Marland Kitchen PROAIR RESPICLICK 123XX123 (90 Base) MCG/ACT AEPB Inhale 2 Doses into the lungs as needed (every four to six hours for cough or wheeze).    Marland Kitchen topiramate (TOPAMAX) 25 MG tablet Take 1 tablet (25 mg total) by mouth daily. 90 tablet 1  . traZODone (DESYREL) 50 MG tablet Take 0.5-1 tablets (25-50 mg total) by mouth at bedtime as needed for sleep. 90 tablet 0  . VITAMIN D PO Take 5,000 mg by mouth daily.     No current  facility-administered medications for this visit.    Known medication allergies: Allergies  Allergen Reactions  . Azithromycin Hives, Shortness Of Breath and Swelling    Throat swelling  . Sulfa Antibiotics Hives  . Ciprofloxacin Swelling  . Moxifloxacin Hcl Hives  . Penicillins Hives  . Bactrim [Sulfamethoxazole-Trimethoprim] Rash     Physical examination: Blood pressure 116/68, pulse 76, temperature 98.4 F (36.9 C), temperature source Temporal, resp. rate 16, height 5\' 6"  (1.676 m), weight 193 lb (87.5 kg), SpO2 97 %.  General: Alert, interactive, in no acute distress. HEENT: PERRLA, TMs pearly gray, turbinates non-edematous without discharge, post-pharynx non erythematous. Neck: Supple without lymphadenopathy. Lungs: Clear to auscultation without wheezing, rhonchi or rales. {no increased work of breathing. CV: Normal S1, S2 without murmurs. Abdomen: Nondistended, nontender. Skin: Warm and dry, without lesions or rashes. Extremities:  No clubbing, cyanosis or edema. Neuro:   Grossly intact.  Diagnositics/Labs: Penicillin skin testing performed today: Histamine prick is positive Negative saline control prick is negative Pre-Pen prick is negative Penicillin G 5000 unit/mL prick is negative.  Saline control intradermal is negative Pre-Pen intradermal is negative Penicillin G 50 units/mL is negative Penicillin G 500 unit/mL is negative Penicillin G 5000 unit/mL negative  Assessment  and plan:   Drug allergy  -History including timing of symptoms related to medication administration and symptoms obtained today for the medications you have a listed allergy for  -It was discussed today that it is very likely that the hives developed during the course of the antibiotics may be related more so to the infection itself than the medication.  It is very common for hives to be a result of an illness infection however there are other causes of hives as well.  -There is only one medication that has a standardized test and that is to penicillins.  We did perform penicillin skin testing today which is negative.  Next step in determining if you are not allergic to penicillin at this time is to perform oral challenge to penicillin in the office.  Please schedule for a oral challenge.  Will need to remain off of antihistamines for 3 days prior to the challenge.  Plan to be in the office at least 2 hours for this challenge.  -The rest of your antibiotics does not have any formal skin testing thus we will proceed with graded oral challenge to the Avelox and lastly to the azithromycin.  -With Bactrim you had a slightly different symptom development with a healing type of a rash which could indicate a more serious type response to Bactrim.  Bactrim and sulfa-based antibiotics can cause varying degrees of skin rashes.  Do to this point recommend that she does avoid Bactrim.    Mild intermittent asthma triggered by URI  -Continue to have access to an albuterol inhaler and use as needed during upper respiratory tract infections  Rhinitis with conjunctivitis, presumed allergic  -If interested in allergy testing we can perform this at a later date.  -Continue as needed use of Allegra 180 mg daily as needed.  -For itchy or watery eyes can use over-the-counter Pataday 1 drop each eye daily as needed  -For nasal drainage recommend use of nasal antihistamine Astelin 2 sprays each nostril twice a day as  needed.  This is a prescription based medication.   Follow-up for in-office oral drug challenge. I appreciate the opportunity to take part in Littie's care. Please do not hesitate to contact me with questions.  Sincerely,   Prudy Feeler,  MD Allergy/Immunology [facility]

## 2019-12-31 NOTE — Patient Instructions (Addendum)
Drug allergy  -History including timing of symptoms related to medication administration and symptoms obtained today for the medications you have a listed allergy for  -It was discussed today that it is very likely that the hives developed during the course of the antibiotics may be related more so to the infection itself than the medication.  It is very common for hives to be a result of an illness infection however there are other causes of hives as well.  -There is only one medication that has a standardized test and that is to penicillins.  We did perform penicillin skin testing today which is negative.  Next step in determining if you are not allergic to penicillin at this time is to perform oral challenge to penicillin in the office.  Please schedule for a oral challenge.  Will need to remain off of antihistamines for 3 days prior to the challenge.  Plan to be in the office at least 2 hours for this challenge.  -The rest of your antibiotics does not have any formal skin testing thus we will proceed with graded oral challenge to the Avelox and lastly to the azithromycin.  -With Bactrim you had a slightly different symptom development with a healing type of a rash which could indicate a more serious type response to Bactrim.  Bactrim and sulfa-based antibiotics can cause varying degrees of skin rashes.  Do to this point recommend that she does avoid Bactrim.    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  -Continue as needed use of Allegra 180 mg daily as needed.  -For itchy or watery eyes can use over-the-counter Pataday 1 drop each eye daily as needed  -For nasal drainage recommend use of nasal antihistamine Astelin 2 sprays each nostril twice a day as needed.  This is a prescription based  medication.   Follow-up for in-office oral drug challenge.

## 2020-01-01 ENCOUNTER — Encounter: Payer: Self-pay | Admitting: Family

## 2020-01-01 DIAGNOSIS — Z889 Allergy status to unspecified drugs, medicaments and biological substances status: Secondary | ICD-10-CM | POA: Insufficient documentation

## 2020-01-03 ENCOUNTER — Other Ambulatory Visit: Payer: Self-pay | Admitting: Obstetrics and Gynecology

## 2020-01-03 DIAGNOSIS — Z3009 Encounter for other general counseling and advice on contraception: Secondary | ICD-10-CM

## 2020-01-03 MED ORDER — NORETHINDRONE ACET-ETHINYL EST 1.5-30 MG-MCG PO TABS: 1.0000 | ORAL_TABLET | Freq: Every day | ORAL | 4 refills | Status: DC

## 2020-01-03 NOTE — Progress Notes (Unsigned)
Selected hormonal contraceptives: Oral contraceptives (birth control pills) and other delivery methods [place] States brand name Progestin (mg)* Estrogen (micrograms) Notes  Monophasic combinations  Beyaz 28 Drospirenone (3) Ethinyl estradiol (20) Also approved for acne and premenstrual dysphoric disorder. In patients with conditions requiring chronic therapy with medications that may increase potassium, monitor serum potassium during the first treatment cycle and periodically thereafter if patient begins medication or develops a condition that increases risk for hyperkalemia.  Gianvi 28?     Loryna 28?     Nikki 28?     Yaz?     Amethyst 28 Levonorgestrel (0.09) Ethinyl estradiol (20)    Aviane 28 Levonorgestrel (0.1) Ethinyl estradiol (20) Packaged as active tablets for 21 days and placebo for 7 days.  Balcoltra 28     Falmina 28     Larissia 28     Lessina 28     Lutera 28     Orsythia 28     Sronyx 28     Blisovi Fe 1/20? Norethindrone acetate (1) Ethinyl estradiol (20) Available in a 28-day regimen with active pills for 24 days and ferrous fumarate for 4 days.  Junel Fe 1/20 28?     Larin Fe 1/20?     Loestrin Fe 1/20 28?     Melodetta 24 Fe chewable     Microgestin Fe 1/20 28?     Minastrin Fe 1/20 chewable     Taytulla capsules     Apri 28 Desogestrel (0.15) Ethinyl estradiol (30) Packaged as active tablets for 21 days and placebo for 7 days.  Cyred Eq 28     Emoquette 28     Enskyce 28     Juleber 28     Reclipsen 28     Ocella 28 Drospirenone (3) Ethinyl estradiol (30) In patients with conditions requiring chronic therapy with medications that may increase potassium, monitor serum potassium during the first treatment cycle and periodically thereafter if patient begins medication or develops a condition that increases risk for hyperkalemia.  Safyral 28     Tydemy 28     Yasmin 28     Zarah 28     Chateal 28 Levonorgestrel (0.15) Ethinyl estradiol (30) Packaged as  active tablets for 21 days and placebo for 7 days.  Kurvelo 28     Levora 0.15/30     Marlissa 28     Portia 28     Blisovi Fe 1.5/30 Norethindrone acetate (1.5) Ethinyl estradiol (30) Packaged as active tablets for 21 days and ferrous fumarate tablets for 7 days.  Larin FE 1.5/30?     Loestrin Fe 1.5/30 28?     Microgestin Fe 1.5/30 28     Junel Fe 1.5/30 28     Cryselle 28 Norgestrel (0.3)? Ethinyl estradiol (30) Packaged as active tablets for 21 days and placebo for 7 days.  Elinest 28     Low-Ogestrel 28     Kelnor 28 Ethynodiol diacetate (1) Ethinyl estradiol (35) Packaged as active tablets for 21 days and placebo for 7 days.  Pirmella 1/35     Zovia 1/35E 28     Balziva 28 Norethindrone (0.4) Ethinyl estradiol (35) Packaged as active tablets for 21 days and placebo for 7 days.  Briellyn 28     Philith 28     Vyfemla     Brevicon 28 Norethindrone (0.5) Ethinyl estradiol (35) Packaged as active tablets for 21 days and placebo for 7 days.  Necon 0.5/35 28  Nortrel 0.5/35 28     Wera     Alyacen 1/35 28 Norethindrone (1) Ethinyl estradiol (35) Packaged as active tablets for 21 days and placebo for 7 days.  Cyclafem 1/35     Dasetta 1/35 28     Nortrel 1/35 28?     Ortho-Novum 1/35 28     Femynor Norgestimate (0.25) Ethinyl estradiol (35) Packaged as active tablets for 21 days and placebo for 7 days.  Mili     MonoNessa 28     Ortho-Cyclen 28     Previfem 28     Sprintec [address], [place], Diane 35 Not available in the [place]; [place] product shown Cyproterone (2) Ethinyl estradiol (35) Labeled approval in [place] is for treatment of acne.  Kelnor 1/50 28 Ethynodiol diacetate (1) Ethinyl estradiol (50) Packaged as active tablets for 21 days and placebo for 7 days. NOTE: Pills containing 50 mcg of ethinyl estradiol are not indicated for routine contraceptive use because of increased risk of cardiovascular events compared with lower dose oral  contraceptive pills.  Ogestrel 0.5/50 28 Norgestrel (0.5)? Ethinyl estradiol (50) Packaged as active tablets for 21 days and placebo for 7 days. NOTE: Pills containing 50 mcg of ethinyl estradiol are not indicated for routine contraceptive use because of increased risk of cardiovascular events compared with lower dose oral contraceptive pills.  Zoely? Nomegestrol acetate (2.5) Estradiol (as hemihydrate) (1.5 milligrams) Not available in the [place]; [place] and [employer] product shown.  Multiphasic combinations  Natazia 28 Dienogest (0,2,3,0) Estradiol valerate (3,2,2,1) NOTE: Estradiol strength listed in milligrams (mg) Packaged as active tablets for 26 days and placebo for 2 days.  Lo Loestrin Fe Norethindrone acetate (1,0) Ethinyl estradiol (10,10) Packaged as active tablets for 26 days and ferrous fumarate for 2 days.  Azurette 28 Desogestrel (0.15,0,0) Ethinyl estradiol (20,0,10) Packaged as active tablets for 26 days and placebo for 2 days or ferrous fumarate for Lo Loestrin Fe.  Bekyree 28     Kariva 28     Mircette 28     Pimtrea 28     Viorele 28     Volnea 28     Estrostep Fe 28 Norethindrone acetate (1,1,1) Ethinyl estradiol (20,30,35) Also approved for acne. Packaged as active tablets for 21 days and ferrous fumarate tablets for 7 days.  Tilia Fe 28     Tri-Legest Fe 28     Ortho Tri-Cyclen Lo 28 Norgestimate (0.18,0.215,0.25) Ethinyl estradiol (25,25,25) Packaged as active tablets for 21 days and placebo for 7 days.  Tri-Lo-Marzia     Tri-Lo-Sprintec     TriNessa Lo     Caziant 28 Desogestrel (0.1,0.125,0.15) Ethinyl estradiol (25,25,25) Packaged as active tablets for 21 days and placebo for 7 days.  Cyclessa 28     Velivet     Enpresse 28 Levonorgestrel (0.05,0.075,0.125) Ethinyl estradiol (30,40,30)    Levonest 28     Myzilra 28     Trivora 28     Ortho Tri-Cyclen 28 Norgestimate (0.18,0.215,0.25) Ethinyl estradiol AN:[id number]) Also approved for  acne. Packaged as active tablets for 21 days and placebo for 7 days.  Tri-Femynor 28     Tri-Linyah 28     Tri-Previfem 28     Tri-Sprintec 28     TriNessa 28     Alyacen 7/7/7 28 Norethindrone (0.5,0.75,1) Ethinyl estradiol AN:[id number]) Packaged as active tablets for 21 days and placebo for 7 days.  Cyclafem 7/7/7  28     Dasetta 7/7/7 28     Nortrel 7/7/7 28     Ortho-Novum 7/7/7 28     Pirmella 7/7/7     Aranelle 7/9/5 28 Norethindrone (0.5,1,0.5) Ethinyl estradiol (35,35,35) Packaged as active tablets for 21 days and placebo for 7 days.  Leena 7/9/5 28     Extended combinations  Amethia Lo 91 Levonorgestrel (0.1) Ethinyl estradiol (20,10) Packaged as a 91-day regimen: 84 days of the combination and 7 days of ethinyl estradiol only.  Camrese Lo 91     LoJaimiess 91     LoSeasonique 91     Fayosim 91 Levonorgestrel (0.15) Ethinyl estradiol (20,25,30,10) Packaged as a 91-day regimen: 84 days of the combination and 7 days of ethinyl estradiol only.  Rivelsa 91     QuarteSe 91     Amethia 91 Levonorgestrel (0.15) Ethinyl estradiol (30,10) Packaged as a 91-day regimen: 84 days of the combination and 7 days of ethinyl estradiol only.  Ashlyna     Camrese 91     Jaimiess 91     Seasonique 91     Introvale 91 Levonorgestrel (0.15) Ethinyl estradiol (30) Packaged as a 91-day regimen: active tablets for 84 days and placebo for 7 days.  Jolessa 91     Quasense 91     Continuous combinations  May use any monophasic 21/7 combination by taking active hormone pills for 28 or more days continuously. Refer to example (Amethyst) at right; any progestin may be used, and higher doses of ethinyl estradiol may be used in some women. Refer to the topic in UpToDate. Levonorgestrel (0.09) Ethinyl estradiol (20)    Progestin-only  Camila 28 Norethindrone (0.35) None Packaged as active tablets for 28 days.  Deblitane     Errin 28     Heather     Incassia     Jencycla     Jolivette 28     Nora-BE 28      Norlyda     Ortho Micronor     Sharobel     Slynd Drospirenone (4) None Packaged as active tablets for 24 days and placebo for 4 days. In patients with conditions requiring chronic therapy with medications that may increase potassium, monitor serum potassium during the first treatment cycle and periodically thereafter if patient begins medication or develops a condition that increases risk for hyperkalemia.  Transdermal patch, weekly  Xulane Norelgestromin (releases 0.15 mg/day) Ethinyl estradiol (releases 35 mcg/day) May have diminished efficacy in women ?90 kg. A new patch is applied every 7 days for 3 weeks followed by a patch-free week. This is therapeutically equivalent to Ortho Evra, which is no longer available in the [place].  Twirla Levonorgestrel (releases 0.12 mg/day) Ethinyl estradiol (releases 30 mcg/day) Contraindicated in women with BMI ?30 kg/m2 due to decreased efficacy and increased risk of VTE. Diminished efficacy was observed in women with BMI ?25 kg/m2. A new patch is applied every 7 days for 3 weeks followed by a patch-free week.  Vaginal ring, monthly  NuvaRing Etonogestrel (releases 0.12 mg/day) Ethinyl estradiol (releases 15 mcg/day) Ring is inserted for 3 weeks followed by 1 week without ring in place. A new ring is inserted 7 days after the last was removed.  EluRyng     Annovera Segesterone (releases 0.15 mg/day) Ethinyl estradiol (releases 13 mcg/day) Ring is inserted for 3 weeks followed by 1 week without ring in place. The ring is then reinserted for the first 21 days of subsequent 28-day  cycles. One system provides contraception for 13 28-day cycles (1 year). Not yet adequately evaluated in women with BMI >29 kg/m2.  Oral and IUD emergency contraceptive options are listed in a table that is available separately in UpToDate. Generic (non-branded) products are also available for most combination oral contraceptives in the [place]. Descriptions are for  [place]-available products unless noted otherwise. Consult local product information before use. Fe: contains iron; BMI: body mass index; VTE: venous thromboembolism; IUD: intrauterine device. * Different progestins are not equivalent on a milligram basis. Refer to the UpToDate overview of combined hormonal contraceptives for guidance on selection.  Also contains 451 mcg of levomefolate calcium per tablet. Johnney Killian is taken for 24 days followed by 4 days of levomefolate calcium alone. Safyral and Tydemy are taken for 21 days followed by 7 days of levomefolate calcium alone. ? Taken as active pills for 24 days and placebo for 4 days. ? Packaged as active tablets for 21 days and ferrous fumarate tablets for 7 days.  Also available as a 21-day regimen that does not contain iron.  Packaged as active tablets for 21 days and placebo for 7 days. ? Also available in a 21-day regimen. ? The progestin norgestrel contains two isomers; only levonorgestrel is bioactive. The amount of norgestrel in each tablet is twice the amount of levonorgestrel. Modified with special permission from The Medical Letter on Drugs and Therapeutics, August 21, 2017; Vol. 60 (1557):161-168. For more information on The Medical Letter, please visit [email or web address].be. Additional data from: [employer]. Copyright  [phone] by [address] rights reserved.

## 2020-01-14 ENCOUNTER — Other Ambulatory Visit: Payer: Self-pay

## 2020-01-14 ENCOUNTER — Ambulatory Visit (INDEPENDENT_AMBULATORY_CARE_PROVIDER_SITE_OTHER): Payer: No Typology Code available for payment source | Admitting: Allergy

## 2020-01-14 ENCOUNTER — Encounter: Payer: Self-pay | Admitting: Allergy

## 2020-01-14 VITALS — BP 94/68 | HR 60 | Temp 97.6°F | Resp 14

## 2020-01-14 DIAGNOSIS — T887XXA Unspecified adverse effect of drug or medicament, initial encounter: Secondary | ICD-10-CM

## 2020-01-14 NOTE — Patient Instructions (Addendum)
Drug allergy  -Penicillin oral challenge with the use of amoxicillin performed today and was successfully passed!  She did not have any immediate signs or symptoms of an allergic reaction.  Thus she is deemed not allergic to penicillins at this time.  We will remove penicillin allergy from her drug allergy list.  She can use penicillins going forward if needed for treatment of infections.  I did discuss with her that penicillins especially amoxicillin and Augmentin can cause a dermatitis with use that is not IgE or allergy mediated; we do try to treat the rash with antihistamines if it occurs with use of the antibiotic in order to continue out the course if possible.   -The rest of your antibiotics do not have any formal skin testing thus recommend we proceed with graded oral challenge to the Avelox and lastly to the azithromycin.  -With Bactrim you had a slightly different symptom development with a pealing type of a rash which could indicate a more serious type response to Bactrim.  Bactrim and sulfa-based antibiotics can cause varying degrees of skin rashes.  Due to this recommend that she does avoid Bactrim.    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  -Continue as needed use of Allegra 180 mg daily as needed.  -For itchy or watery eyes can use over-the-counter Pataday 1 drop each eye daily as needed  -For nasal drainage recommend use of nasal antihistamine Astelin 2 sprays each nostril twice a day as needed.  This is a prescription based medication.   Follow-up for in-office oral drug challenge.

## 2020-01-14 NOTE — Progress Notes (Signed)
Follow-up Note  RE: Mary Richards MRN: [id number] DOB: 1986-09-18 Date of Office Visit: 01/14/2020   History of present illness: Mary Richards is a 34 y.o. female presenting today for oral drug challenge to amoxicillin.  She was seen for initial evaluation on 12/31/19 by myself where she had negative PCN skin testing performed.  She presents today for oral challenge to rule out a PCN allergy.  She is in her normal state of health without any recent illnesses.  She has not had any antihistamines in the past 3 days.   Review of systems: Review of Systems  Constitutional: Negative.   HENT: Negative.   Eyes: Negative.   Respiratory: Negative.   Cardiovascular: Negative.   Gastrointestinal: Negative.   Musculoskeletal: Negative.   Skin: Negative.   Neurological: Negative.     All other systems negative unless noted above in HPI  Past medical/social/surgical/family history have been reviewed and are unchanged unless specifically indicated below.  No changes  Medication List: Current Outpatient Medications  Medication Sig Dispense Refill  . azelastine (ASTELIN) 0.1 % nasal spray Place 1 spray into both nostrils 2 (two) times daily. Use in each nostril as directed 30 mL 0  . Biotin 1000 MCG tablet Take 1,000 mcg by mouth daily.    . fexofenadine (ALLEGRA) 180 MG tablet Take 180 mg by mouth daily as needed.    . gabapentin (NEURONTIN) 100 MG capsule Take 1 capsule (100 mg total) by mouth at bedtime. 90 capsule 3  . meloxicam (MOBIC) 7.5 MG tablet TAKE 1 TABLET (7.5 MG TOTAL) BY MOUTH DAILY AS NEEDED FOR PAIN. 90 tablet 0  . Multiple Vitamin (MULTI-VITAMIN) tablet Take by mouth.    . Norethindrone Acetate-Ethinyl Estradiol (LARIN 1.5/30) 1.5-30 MG-MCG tablet Take 1 tablet by mouth daily. 4 Package 4  . PROAIR RESPICLICK 123XX123 (90 Base) MCG/ACT AEPB Inhale 2 Doses into the lungs as needed (every four to six hours for cough or wheeze).    . traZODone (DESYREL) 50 MG tablet Take 0.5-1  tablets (25-50 mg total) by mouth at bedtime as needed for sleep. 90 tablet 0  . VITAMIN D PO Take 5,000 mg by mouth daily.     No current facility-administered medications for this visit.     Known medication allergies: Allergies  Allergen Reactions  . Azithromycin Hives, Shortness Of Breath and Swelling    Throat swelling  . Sulfa Antibiotics Hives  . Ciprofloxacin Swelling  . Moxifloxacin Hcl Hives  . Penicillins Hives  . Bactrim [Sulfamethoxazole-Trimethoprim] Rash     Physical examination: Blood pressure 94/68, pulse 60, temperature 97.6 F (36.4 C), temperature source Temporal, resp. rate 14, SpO2 98 %.  General: Alert, interactive, in no acute distress. HEENT: PERRLA, TMs pearly gray, turbinates non-edematous without discharge, post-pharynx non erythematous. Neck: Supple without lymphadenopathy. Lungs: Clear to auscultation without wheezing, rhonchi or rales. {no increased work of breathing. CV: Normal S1, S2 without murmurs. Abdomen: Nondistended, nontender. Skin: Warm and dry, without lesions or rashes. Extremities:  No clubbing, cyanosis or edema. Neuro:   Grossly intact.  Diagnositics/Labs: Oral drug challenge to PCN with use of Amoxicillin 250mg /5 mL. Benefits and risks of challenge discussed and verbal consent obtained.    She was provided with increasing doses every 20 minutes and consumed total of 500 mg.  She was given a 1%, 10% and 90% dose of the Toprol 500 mg.   She was observed for additional hour after completion of ingestion challenge.  She had no signs/symptoms of  allergic reaction.  Vitals were obtained prior to discharge and remained stable.    Assessment and plan:   Drug allergy  -Penicillin oral challenge with the use of amoxicillin performed today and was successfully passed!  She did not have any immediate signs or symptoms of an allergic reaction.  Thus she is deemed not allergic to penicillins at this time.  We will remove penicillin allergy from  her drug allergy list.  She can use penicillins going forward if needed for treatment of infections.  I did discuss with her that penicillins especially amoxicillin and Augmentin can cause a dermatitis with use that is not IgE or allergy mediated; we do try to treat the rash with antihistamines if it occurs with use of the antibiotic in order to continue out the course if possible.    -The rest of your antibiotics do not have any formal skin testing thus recommend we proceed with graded oral challenge to the Avelox and lastly to the azithromycin.  -With Bactrim you had a slightly different symptom development with a pealing type of a rash which could indicate a more serious type response to Bactrim.  Bactrim and sulfa-based antibiotics can cause varying degrees of skin rashes.  Due to this recommend that she does avoid Bactrim.    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  -Continue as needed use of Allegra 180 mg daily as needed.  -For itchy or watery eyes can use over-the-counter Pataday 1 drop each eye daily as needed  -For nasal drainage recommend use of nasal antihistamine Astelin 2 sprays each nostril twice a day as needed.  This is a prescription based medication.   Follow-up for in-office oral drug challenge.  I appreciate the opportunity to take part in Mary Richards's care. Please do not hesitate to contact me with questions.  Sincerely,   Prudy Feeler, MD Allergy/Immunology [facility]

## 2020-01-20 ENCOUNTER — Other Ambulatory Visit (INDEPENDENT_AMBULATORY_CARE_PROVIDER_SITE_OTHER): Payer: No Typology Code available for payment source

## 2020-01-20 ENCOUNTER — Other Ambulatory Visit: Payer: Self-pay

## 2020-01-20 DIAGNOSIS — R899 Unspecified abnormal finding in specimens from other organs, systems and tissues: Secondary | ICD-10-CM

## 2020-01-20 LAB — CBC WITH DIFFERENTIAL/PLATELET
Basophils Absolute: 0.1 10*3/uL (ref 0.0–0.1)
Basophils Relative: 0.5 % (ref 0.0–3.0)
Eosinophils Absolute: 0.1 10*3/uL (ref 0.0–0.7)
Eosinophils Relative: 1.5 % (ref 0.0–5.0)
HCT: 44 % (ref 36.0–46.0)
Hemoglobin: 14.7 g/dL (ref 12.0–15.0)
Lymphocytes Relative: 30.9 % (ref 12.0–46.0)
Lymphs Abs: 3.1 10*3/uL (ref 0.7–4.0)
MCHC: 33.3 g/dL (ref 30.0–36.0)
MCV: 93.2 fl (ref 78.0–100.0)
Monocytes Absolute: 0.7 10*3/uL (ref 0.1–1.0)
Monocytes Relative: 7.4 % (ref 3.0–12.0)
Neutro Abs: 5.9 10*3/uL (ref 1.4–7.7)
Neutrophils Relative %: 59.7 % (ref 43.0–77.0)
Platelets: 313 10*3/uL (ref 150.0–400.0)
RBC: 4.72 Mil/uL (ref 3.87–5.11)
RDW: 13.2 % (ref 11.5–15.5)
WBC: 9.9 10*3/uL (ref 4.0–10.5)

## 2020-01-20 LAB — IBC + FERRITIN
Ferritin: 29.9 ng/mL (ref 10.0–291.0)
Iron: 127 ug/dL (ref 42–145)
Saturation Ratios: 34.1 % (ref 20.0–50.0)
Transferrin: 266 mg/dL (ref 212.0–360.0)

## 2020-03-10 ENCOUNTER — Ambulatory Visit: Payer: No Typology Code available for payment source | Admitting: Dermatology

## 2020-03-10 ENCOUNTER — Other Ambulatory Visit: Payer: Self-pay

## 2020-03-10 ENCOUNTER — Encounter: Payer: Self-pay | Admitting: Dermatology

## 2020-03-10 DIAGNOSIS — L578 Other skin changes due to chronic exposure to nonionizing radiation: Secondary | ICD-10-CM | POA: Diagnosis not present

## 2020-03-10 DIAGNOSIS — L814 Other melanin hyperpigmentation: Secondary | ICD-10-CM | POA: Diagnosis not present

## 2020-03-10 DIAGNOSIS — D2261 Melanocytic nevi of right upper limb, including shoulder: Secondary | ICD-10-CM | POA: Diagnosis not present

## 2020-03-10 DIAGNOSIS — D229 Melanocytic nevi, unspecified: Secondary | ICD-10-CM

## 2020-03-10 DIAGNOSIS — L811 Chloasma: Secondary | ICD-10-CM

## 2020-03-10 NOTE — Progress Notes (Signed)
   New Patient Visit  Subjective  Mary Richards is a 34 y.o. female who presents for the following: Dark spots (Dark spots on face, started showing up about a year ago) and Nevus (on Right shoulder x 1 year it feels raised and rough, has been irritated by bra strap recently).   The following portions of the chart were reviewed this encounter and updated as appropriate:      Review of Systems:  No other skin or systemic complaints except as noted in HPI or Assessment and Plan.  Objective  Well appearing patient in no apparent distress; mood and affect are within normal limits.  A focused examination was performed including face, neck, chest and back. Relevant physical exam findings are noted in the Assessment and Plan.  Objective  Forehead: Reticulated hyperpigmented patches.   Images        Objective  Right Shoulder: 40mm  raised fleshy brown papule   Assessment & Plan    Melasma Forehead  Discussed chronic condition, worse in summer due to higher UV exposure.  Oral estrogen containing BCPs or supplements can exacerbate condition.  Recommend daily broad spectrum tinted sunscreen SPF 30+ to face, preferably with Zinc or Titanium Dioxide. Discussed Rx topical bleaching creams (i.e. hydroquinone), OTC HelioCare supplement, chemical peels (would need multiple for best result).   Will send in Skin [employer] cream with Hydroquinone: 12% Kojic Acid: 6% Niacinamide: 2% Vitamin C: 1%at this time.  Apply a thin layer to affected areas twice daily for 6 weeks. #30g 0RF   Recommend taking Heliocare sun protection supplement daily in sunny weather for additional sun protection. For maximum protection on the sunniest days, you can take up to 2 capsules of regular Heliocare OR take 1 capsule of Heliocare Ultra. For prolonged exposure (such as a full day in the sun), you can repeat your dose of the supplement 4 hours after your first dose. Heliocare can be purchased at [facility] or at [email or web address].       Nevus Right Shoulder  Benign-appearing.  Observation.  Call clinic for new or changing moles.  Recommend daily use of broad spectrum spf 30+ sunscreen to sun-exposed areas.     Lentigines -  - Scattered tan macules face- 84mm light brown macule left zygoma - Discussed due to sun exposure, recommend sunscreen daily - Benign, observe, discussed Cryotherapy, BBL, and fade cream to lighten. - Call for any changes  Actinic Damage - diffuse scaly erythematous macules with underlying dyspigmentation - Recommend daily broad spectrum sunscreen SPF 30+ to sun-exposed areas, reapply every 2 hours as needed.  - Call for new or changing lesions.     Return if symptoms worsen or fail to improve, for Patient will call if any changes.   Marene Lenz, CMA, am acting as scribe for Brendolyn Patty, MD .   Documentation: I have reviewed the above documentation for accuracy and completeness, and I agree with the above.  Brendolyn Patty, MD

## 2020-03-10 NOTE — Patient Instructions (Addendum)
Recommend daily broad spectrum sunscreen SPF 30+ to sun-exposed areas, reapply every 2 hours as needed. Call for new or changing lesions.  Discussed chronic condition, worse in summer due to higher UV exposure.  Oral estrogen containing BCPs or supplements can exacerbate condition.  Recommend daily broad spectrum tinted sunscreen SPF 30+ to face, preferably with Zinc or Titanium Dioxide. Discussed Rx topical bleaching creams (i.e. hydroquinone), OTC HelioCare supplement, chemical peels (would need multiple for best result).   Recommend taking Heliocare sun protection supplement daily in sunny weather for additional sun protection. For maximum protection on the sunniest days, you can take up to 2 capsules of regular Heliocare OR take 1 capsule of Heliocare Ultra. For prolonged exposure (such as a full day in the sun), you can repeat your dose of the supplement 4 hours after your first dose. Heliocare can be purchased at [facility] or at [email or web address].

## 2020-03-17 ENCOUNTER — Telehealth: Payer: No Typology Code available for payment source | Admitting: Physician Assistant

## 2020-03-17 DIAGNOSIS — N760 Acute vaginitis: Secondary | ICD-10-CM | POA: Diagnosis not present

## 2020-03-17 MED ORDER — FLUCONAZOLE 150 MG PO TABS: 150.0000 mg | ORAL_TABLET | Freq: Once | ORAL | 0 refills | Status: AC

## 2020-03-17 NOTE — Progress Notes (Signed)
We are sorry that you are not feeling well. Here is how we plan to help! 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  This does seem to be recurrent based on your report.  I will write for additional treatment today, but I strongly recommend a follow-up with your PCP or OB/GYN to discuss the recurrence of this and preventative therapies.  Vaginosis is an inflammation of the vagina that can result in discharge, itching and pain. The cause is usually a change in the normal balance of vaginal bacteria or an infection. Vaginosis can also result from reduced estrogen levels after menopause.  The most common causes of vaginosis are:   Bacterial vaginosis which results from an overgrowth of one on several organisms that are normally present in your vagina.   Yeast infections which are caused by a naturally occurring fungus called candida.   Vaginal atrophy (atrophic vaginosis) which results from the thinning of the vagina from reduced estrogen levels after menopause.   Trichomoniasis which is caused by a parasite and is commonly transmitted by sexual intercourse.  Factors that increase your risk of developing vaginosis include: Marland Kitchen Medications, such as antibiotics and steroids . Uncontrolled diabetes . Use of hygiene products such as bubble bath, vaginal spray or vaginal deodorant . Douching . Wearing damp or tight-fitting clothing . Using an intrauterine device (IUD) for birth control . Hormonal changes, such as those associated with pregnancy, birth control pills or menopause . Sexual activity . Having a sexually transmitted infection  Your treatment plan is A single Diflucan (fluconazole) 150mg  tablet once.  I have electronically sent this prescription into the pharmacy that you have chosen.  Be sure to take all of the medication as directed. Stop taking any medication if you develop a rash, tongue swelling or shortness of breath. Mothers who are breast  feeding should consider pumping and discarding their breast milk while on these antibiotics. However, there is no consensus that infant exposure at these doses would be harmful.  Remember that medication creams can weaken latex condoms. Marland Kitchen   HOME CARE:  Good hygiene may prevent some types of vaginosis from recurring and may relieve some symptoms:  . Avoid baths, hot tubs and whirlpool spas. Rinse soap from your outer genital area after a shower, and dry the area well to prevent irritation. Don't use scented or harsh soaps, such as those with deodorant or antibacterial action. Marland Kitchen Avoid irritants. These include scented tampons and pads. . Wipe from front to back after using the toilet. Doing so avoids spreading fecal bacteria to your vagina.  Other things that may help prevent vaginosis include:  Marland Kitchen Don't douche. Your vagina doesn't require cleansing other than normal bathing. Repetitive douching disrupts the normal organisms that reside in the vagina and can actually increase your risk of vaginal infection. Douching won't clear up a vaginal infection. . Use a latex condom. Both female and female latex condoms may help you avoid infections spread by sexual contact. . Wear cotton underwear. Also wear pantyhose with a cotton crotch. If you feel comfortable without it, skip wearing underwear to bed. Yeast thrives in [employer] Your symptoms should improve in the next day or two.  GET HELP RIGHT AWAY IF:  . You have pain in your lower abdomen ( pelvic area or over your ovaries) . You develop nausea or vomiting . You develop a fever . Your discharge changes or worsens . You have persistent pain with  intercourse . You develop shortness of breath, a rapid pulse, or you faint.  These symptoms could be signs of problems or infections that need to be evaluated by a medical provider now.  MAKE SURE YOU    Understand these instructions.  Will watch your condition.  Will get help right away  if you are not doing well or get worse.  Your e-visit answers were reviewed by a board certified advanced clinical practitioner to complete your personal care plan. Depending upon the condition, your plan could have included both over the counter or prescription medications. Please review your pharmacy choice to make sure that you have choses a pharmacy that is open for you to pick up any needed prescription, Your safety is important to [place]. If you have drug allergies check your prescription carefully.   You can use MyChart to ask questions about today's visit, request a non-urgent call back, or ask for a work or school excuse for 24 hours related to this e-Visit. If it has been greater than 24 hours you will need to follow up with your provider, or enter a new e-Visit to address those concerns. You will get a MyChart message within the next two days asking about your experience. I hope that your e-visit has been valuable and will speed your recovery.  Greater than 5 minutes, yet less than 10 minutes of time have been spent researching, coordinating, and implementing care for this patient today

## 2020-04-01 ENCOUNTER — Encounter: Payer: Self-pay | Admitting: Family

## 2020-04-10 ENCOUNTER — Ambulatory Visit: Payer: No Typology Code available for payment source | Admitting: Advanced Practice Midwife

## 2020-04-16 ENCOUNTER — Other Ambulatory Visit: Payer: Self-pay

## 2020-04-16 ENCOUNTER — Ambulatory Visit (INDEPENDENT_AMBULATORY_CARE_PROVIDER_SITE_OTHER): Payer: No Typology Code available for payment source | Admitting: Advanced Practice Midwife

## 2020-04-16 ENCOUNTER — Encounter: Payer: Self-pay | Admitting: Advanced Practice Midwife

## 2020-04-16 ENCOUNTER — Other Ambulatory Visit (HOSPITAL_COMMUNITY)
Admission: RE | Admit: 2020-04-16 | Discharge: 2020-04-16 | Disposition: A | Discharge status: Home / Self Care | Payer: No Typology Code available for payment source | Source: Ambulatory Visit | Attending: Advanced Practice Midwife | Admitting: Advanced Practice Midwife

## 2020-04-16 VITALS — BP 125/80 | HR 88 | Ht 66.0 in | Wt 196.0 lb

## 2020-04-16 DIAGNOSIS — Z01419 Encounter for gynecological examination (general) (routine) without abnormal findings: Secondary | ICD-10-CM | POA: Diagnosis not present

## 2020-04-16 DIAGNOSIS — N898 Other specified noninflammatory disorders of vagina: Secondary | ICD-10-CM

## 2020-04-16 NOTE — Progress Notes (Signed)
Gynecology Annual Exam   PCP: Burnard Hawthorne, FNP  Chief Complaint:  Chief Complaint  Patient presents with  . Gynecologic Exam  . Vaginitis    itching, discharge w/odor before cycles    History of Present Illness: Patient is a 34 y.o. G1P1001 presents for annual exam. The patient has complaint today of recurring vaginitis symptoms specifically yeast. She denies current symptoms, however she has symptoms at least 6 times per year. She wears cotton underwear and avoids tight fitting clothes. She uses hypoallergenic body care and laundry products. Symptoms usually appear after intercourse or before or after her cycle. She uses condoms. Symptoms usually include itching, thick white discharge and some vaginal irritation with urination. We discussed strategies for decreasing episodes of yeast.   LMP: Patient's last menstrual period was 04/07/2020. Average Interval: regular, 28 days Duration of flow: 6 days Heavy Menses: 1 heavy day Clots: no Intermenstrual Bleeding: no Postcoital Bleeding: no Dysmenorrhea: no  The patient is sexually active. She currently uses condoms for contraception. She denies dyspareunia.  The patient does perform self breast exams.  There is no notable family history of breast or ovarian cancer in her family.  The patient wears seatbelts: yes.   The patient has regular exercise: she walks a lot at work and does a regular 30 minute workout at home. She admits healthy lifestyle with decreased sugar and fried/processed fluids. She admits adequate hydration and sleep.    The patient denies current symptoms of depression. She has some anxiety and has good coping techniques.    Review of Systems: Review of Systems  Constitutional: Negative for chills and fever.  HENT: Negative for congestion, ear discharge, ear pain, hearing loss, sinus pain and sore throat.   Eyes: Negative for blurred vision and double vision.  Respiratory: Negative for cough, shortness of  breath and wheezing.   Cardiovascular: Negative for chest pain, palpitations and leg swelling.  Gastrointestinal: Negative for abdominal pain, blood in stool, constipation, diarrhea, heartburn, melena, nausea and vomiting.  Genitourinary: Negative for dysuria, flank pain, frequency, hematuria and urgency.  Musculoskeletal: Negative for back pain, joint pain and myalgias.  Skin: Negative for itching and rash.  Neurological: Negative for dizziness, tingling, tremors, sensory change, speech change, focal weakness, seizures, loss of consciousness, weakness and headaches.  Endo/Heme/Allergies: Negative for environmental allergies. Does not bruise/bleed easily.  Psychiatric/Behavioral: Negative for depression, hallucinations, memory loss, substance abuse and suicidal ideas. The patient is not nervous/anxious and does not have insomnia.     Past Medical History:  Patient Active Problem List   Diagnosis Date Noted  . Drug allergy 01/01/2020    12/2019: Allergy consult, Dr Nelva Bush Drug allergy  -History including timing of symptoms related to medication administration and symptoms obtained today for the medications you have a listed allergy for  -It was discussed today that it is very likely that the hives developed during the course of the antibiotics may be related more so to the infection itself than the medication.  It is very common for hives to be a result of an illness infection however there are other causes of hives as well.  -There is only one medication that has a standardized test and that is to penicillins.  We did perform penicillin skin testing today which is negative.  Next step in determining if you are not allergic to penicillin at this time is to perform oral challenge to penicillin in the office.  Please schedule for a oral challenge.  Will  need to remain off of antihistamines for 3 days prior to the challenge.  Plan to be in the office at least 2 hours for this challenge.  -The rest of  your antibiotics does not have any formal skin testing thus we will proceed with graded oral challenge to the Avelox and lastly to the azithromycin.  -With Bactrim you had a slightly different symptom development with a healing type of a rash which could indicate a more serious type response to Bactrim.  Bactrim and sulfa-based antibiotics can cause varying degrees of skin rashes.  Do to this point recommend that she does avoid Bactrim.   Marland Kitchen Routine physical examination 12/17/2019  . Cold intolerance 12/17/2019  . GAD (generalized anxiety disorder) 07/08/2019  . Chronic low back pain 07/08/2019  . Migraine 07/08/2019    Past Surgical History:  Past Surgical History:  Procedure Laterality Date  . CHOLECYSTECTOMY    . PILONIDAL CYST EXCISION    . TONSILECTOMY, ADENOIDECTOMY, BILATERAL MYRINGOTOMY AND TUBES    . TYMPANOPLASTY Right 05/10/2019   Procedure: TYMPANOPLASTY;  Surgeon: Beverly Gust, MD;  Location: [place];  Service: ENT;  Laterality: Right;    Gynecologic History:  Patient's last menstrual period was 04/07/2020. Contraception: condoms Last Pap: 1 year ago Results were: no abnormalities   Obstetric History: G1P1001  Family History:  Family History  Problem Relation Age of Onset  . Brain cancer Father 56  . Valvular heart disease Sister   . Brain cancer Paternal Grandmother 98  . COPD Maternal Grandfather     Social History:  Social History   Socioeconomic History  . Marital status: Married    Spouse name: Not on file  . Number of children: Not on file  . Years of education: Not on file  . Highest education level: Not on file  Occupational History  . Not on file  Tobacco Use  . Smoking status: Never Smoker  . Smokeless tobacco: Never Used  Substance and Sexual Activity  . Alcohol use: Yes    Alcohol/week: 2.0 standard drinks    Types: 2 Standard drinks or equivalent per week    Comment: Social   . Drug use: Never  . Sexual activity: Yes     Birth control/protection: Condom  Other Topics Concern  . Not on file  Social History Narrative   Works as Designer, multimedia in [place] Strain:   . Difficulty of Paying Living Expenses:   Food Insecurity:   . Worried About Charity fundraiser in the Last Year:   . Arboriculturist in the Last Year:   Transportation Needs:   . Film/video editor (Medical):   Marland Kitchen Lack of Transportation (Non-Medical):   Physical Activity:   . Days of Exercise per Week:   . Minutes of Exercise per Session:   Stress:   . Feeling of Stress :   Social Connections:   . Frequency of Communication with Friends and Family:   . Frequency of Social Gatherings with Friends and Family:   . Attends Religious Services:   . Active Member of Clubs or Organizations:   . Attends Archivist Meetings:   Marland Kitchen Marital Status:   Intimate Partner Violence:   . Fear of Current or Ex-Partner:   . Emotionally Abused:   Marland Kitchen Physically Abused:   . Sexually Abused:     Allergies:  Allergies  Allergen Reactions  . Azithromycin Hives, Shortness Of Breath  and Swelling    Throat swelling  . Sulfa Antibiotics Hives  . Ciprofloxacin Swelling  . Moxifloxacin Hcl Hives  . Bactrim [Sulfamethoxazole-Trimethoprim] Rash    Medications: Prior to Admission medications   Medication Sig Start Date End Date Taking? Authorizing Provider  azelastine (ASTELIN) 0.1 % nasal spray Place 1 spray into both nostrils 2 (two) times daily. Use in each nostril as directed 11/01/18  Yes Darlin Priestly, PA-C  Biotin 1000 MCG tablet Take 1,000 mcg by mouth daily.   Yes [provider]  fexofenadine (ALLEGRA) 180 MG tablet Take 180 mg by mouth daily as needed.   Yes [provider]  gabapentin (NEURONTIN) 100 MG capsule Take 1 capsule (100 mg total) by mouth at bedtime. 07/08/19  Yes Burnard Hawthorne, FNP  meloxicam (MOBIC) 7.5 MG tablet TAKE 1 TABLET (7.5 MG TOTAL) BY MOUTH DAILY AS  NEEDED FOR PAIN. 12/12/19  Yes Burnard Hawthorne, FNP  Multiple Vitamin (MULTI-VITAMIN) tablet Take by mouth.   Yes [provider]  PROAIR RESPICLICK 123XX123 (90 Base) MCG/ACT AEPB Inhale 2 Doses into the lungs as needed (every four to six hours for cough or wheeze).   Yes [provider]  traZODone (DESYREL) 50 MG tablet Take 0.5-1 tablets (25-50 mg total) by mouth at bedtime as needed for sleep. 12/17/19  Yes Burnard Hawthorne, FNP  VITAMIN D PO Take 5,000 mg by mouth daily.   Yes [provider]  fluconazole (DIFLUCAN) 150 MG tablet Take 150 mg by mouth once. 03/17/20   [provider]    Physical Exam Vitals: Blood pressure 125/80, pulse 88, height 5\' 6"  (1.676 m), weight 196 lb (88.9 kg), last menstrual period 04/07/2020.  General: NAD HEENT: normocephalic, anicteric Thyroid: no enlargement, no palpable nodules Pulmonary: No increased work of breathing, CTAB Cardiovascular: RRR, distal pulses 2+ Breast: Breast symmetrical, no tenderness, no palpable nodules or masses, no skin or nipple retraction present, no nipple discharge.  No axillary or supraclavicular lymphadenopathy. Abdomen: NABS, soft, non-tender, non-distended.  Umbilicus without lesions.  No hepatomegaly, splenomegaly or masses palpable. No evidence of hernia  Genitourinary:  External: Normal external female genitalia.  Normal urethral meatus, normal Bartholin's and Skene's glands.    Vagina: Normal vaginal mucosa, no evidence of prolapse.    Cervix: Grossly normal in appearance, no bleeding  Uterus: Non-enlarged, mobile, normal contour.  No CMT  Adnexa: ovaries non-enlarged, no adnexal masses  Rectal: deferred  Lymphatic: no evidence of inguinal lymphadenopathy Extremities: no edema, erythema, or tenderness Neurologic: Grossly intact Psychiatric: mood appropriate, affect full  Female chaperone present for pelvic and breast  portions of the physical exam    Assessment: 34 y.o. G1P1001  routine annual exam  Plan: Problem List Items Addressed This Visit    None    Visit Diagnoses    Well woman exam with routine gynecological exam    -  Primary   Vaginal discharge       Relevant Orders   Cervicovaginal ancillary only   Vagina itching       Relevant Orders   Cervicovaginal ancillary only      1) STI screening  was offered and declined  2)  ASCCP guidelines and rationale discussed.  Patient opts for every 3 years screening interval  3) Contraception - the patient is currently using  condoms.  She is happy with her current form of contraception and plans to continue  4) Routine healthcare maintenance including cholesterol, diabetes screening discussed managed by PCP  5) Vaginitis prevention: boric acid suppository, apple cider vinegar baths, probiotics, cotton underwear, loose fitting clothes, hypoallergenic body and laundry products  6) Return in about 1 year (around 04/16/2021) for annual established gyn.   Rod Can, [facility] 04/16/2020, 5:02 PM

## 2020-04-16 NOTE — Patient Instructions (Signed)
Vaginitis Vaginitis is a condition in which the vaginal tissue swells and becomes red (inflamed). This condition is most often caused by a change in the normal balance of bacteria and yeast that live in the vagina. This change causes an overgrowth of certain bacteria or yeast, which causes the inflammation. There are different types of vaginitis, but the most common types are:  Bacterial vaginosis.  Yeast infection (candidiasis).  Trichomoniasis vaginitis. This is a sexually transmitted disease (STD).  Viral vaginitis.  Atrophic vaginitis.  Allergic vaginitis. What are the causes? The cause of this condition depends on the type of vaginitis. It can be caused by:  Bacteria (bacterial vaginosis).  Yeast, which is a fungus (yeast infection).  A parasite (trichomoniasis vaginitis).  A virus (viral vaginitis).  Low hormone levels (atrophic vaginitis). Low hormone levels can occur during pregnancy, breastfeeding, or after menopause.  Irritants, such as bubble baths, scented tampons, and feminine sprays (allergic vaginitis). Other factors can change the normal balance of the yeast and bacteria that live in the vagina. These include:  Antibiotic medicines.  Poor hygiene.  Diaphragms, vaginal sponges, spermicides, birth control pills, and intrauterine devices (IUD).  Sex.  Infection.  Uncontrolled diabetes.  A weakened defense (immune) system. What increases the risk? This condition is more likely to develop in women who:  Smoke.  Use vaginal douches, scented tampons, or scented sanitary pads.  Wear tight-fitting pants.  Wear thong underwear.  Use oral birth control pills or an IUD.  Have sex without a condom.  Have multiple sex partners.  Have an STD.  Frequently use the spermicide nonoxynol-9.  Eat lots of foods high in sugar.  Have uncontrolled diabetes.  Have low estrogen levels.  Have a weakened immune system from an immune disorder or medical  treatment.  Are pregnant or breastfeeding. What are the signs or symptoms? Symptoms vary depending on the cause of the vaginitis. Common symptoms include:  Abnormal vaginal discharge. ? The discharge is white, gray, or yellow with bacterial vaginosis. ? The discharge is thick, white, and cheesy with a yeast infection. ? The discharge is frothy and yellow or greenish with trichomoniasis.  A bad vaginal smell. The smell is fishy with bacterial vaginosis.  Vaginal itching, pain, or swelling.  Sex that is painful.  Pain or burning when urinating. Sometimes there are no symptoms. How is this diagnosed? This condition is diagnosed based on your symptoms and medical history. A physical exam, including a pelvic exam, will also be done. You may also have other tests, including:  Tests to determine the pH level (acidity or alkalinity) of your vagina.  A whiff test, to assess the odor that results when a sample of your vaginal discharge is mixed with a potassium hydroxide solution.  Tests of vaginal fluid. A sample will be examined under a microscope. How is this treated? Treatment varies depending on the type of vaginitis you have. Your treatment may include:  Antibiotic creams or pills to treat bacterial vaginosis and trichomoniasis.  Antifungal medicines, such as vaginal creams or suppositories, to treat a yeast infection.  Medicine to ease discomfort if you have viral vaginitis. Your sexual partner should also be treated.  Estrogen delivered in a cream, pill, suppository, or vaginal ring to treat atrophic vaginitis. If vaginal dryness occurs, lubricants and moisturizing creams may help. You may need to avoid scented soaps, sprays, or douches.  Stopping use of a product that is causing allergic vaginitis. Then using a vaginal cream to treat the symptoms. Follow   these instructions at home: Lifestyle  Keep your genital area clean and dry. Avoid soap, and only rinse the area with  water.  Do not douche or use tampons until your health care provider says it is okay to do so. Use sanitary pads, if needed.  Do not have sex until your health care provider approves. When you can return to sex, practice safe sex and use condoms.  Wipe from front to back. This avoids the spread of bacteria from the rectum to the vagina. General instructions  Take over-the-counter and prescription medicines only as told by your health care provider.  If you were prescribed an antibiotic medicine, take or use it as told by your health care provider. Do not stop taking or using the antibiotic even if you start to feel better.  Keep all follow-up visits as told by your health care provider. This is important. How is this prevented?  Use mild, non-scented products. Do not use things that can irritate the vagina, such as fabric softeners. Avoid the following products if they are scented: ? Feminine sprays. ? Detergents. ? Tampons. ? Feminine hygiene products. ? Soaps or bubble baths.  Let air reach your genital area. ? Wear cotton underwear to reduce moisture buildup. ? Avoid wearing underwear while you sleep. ? Avoid wearing tight pants and underwear or nylons without a cotton panel. ? Avoid wearing thong underwear.  Take off any wet clothing, such as bathing suits, as soon as possible.  Practice safe sex and use condoms. Contact a health care provider if:  You have abdominal pain.  You have a fever.  You have symptoms that last for more than 2-3 days. Get help right away if:  You have a fever and your symptoms suddenly get worse. Summary  Vaginitis is a condition in which the vaginal tissue becomes inflamed.This condition is most often caused by a change in the normal balance of bacteria and yeast that live in the vagina.  Treatment varies depending on the type of vaginitis you have.  Do not douche, use tampons , or have sex until your health care provider approves. When  you can return to sex, practice safe sex and use condoms. This information is not intended to replace advice given to you by your health care provider. Make sure you discuss any questions you have with your health care provider. Document Revised: 10/13/2017 Document Reviewed: 12/06/2016 Elsevier Patient Education  2020 [employer]  

## 2020-04-17 LAB — CERVICOVAGINAL ANCILLARY ONLY
Bacterial Vaginitis (gardnerella): NEGATIVE
Candida Glabrata: NEGATIVE
Candida Vaginitis: NEGATIVE
Comment: NEGATIVE
Comment: NEGATIVE
Comment: NEGATIVE

## 2020-04-29 ENCOUNTER — Encounter: Payer: Self-pay | Admitting: Family

## 2020-04-29 ENCOUNTER — Telehealth (INDEPENDENT_AMBULATORY_CARE_PROVIDER_SITE_OTHER): Payer: No Typology Code available for payment source | Admitting: Family

## 2020-04-29 VITALS — BP 94/62 | Ht 66.5 in | Wt 193.0 lb

## 2020-04-29 DIAGNOSIS — Z6830 Body mass index (BMI) 30.0-30.9, adult: Secondary | ICD-10-CM

## 2020-04-29 DIAGNOSIS — E669 Obesity, unspecified: Secondary | ICD-10-CM | POA: Diagnosis not present

## 2020-04-29 MED ORDER — BUPROPION HCL ER (XL) 150 MG PO TB24: ORAL_TABLET | ORAL | 3 refills | Status: DC

## 2020-04-29 NOTE — Assessment & Plan Note (Addendum)
Discussed low carb diet. Trial wellbutrin in absence of contraindications.  She declined referral to health and wellness, nutrition at this time.  Advised to read AVS Summary on my chart, patient verbalized understanding Close follow up

## 2020-04-29 NOTE — Patient Instructions (Addendum)
Certainly let me know if cold intolerance were to worsen, changes.   Trial wellbutrin  We discussed today starting medication called Wellbutrin.  As also discussed, you must limit alcohol on this medication as alcohol and Wellbutrin together may increase your risk for seizure.  You may drink no more than 1 alcoholic beverage on this medication.  A standard drink is 12 ounces of regular beer, which is usually about 5% alcohol OR 5 ounces of wine, which is typically about 12% alcohol OR   1.5 ounces of distilled spirits, which is about 40% alcohol   This is  Dr. Lupita Dawn  example of a  "Low GI"  Diet:  It will allow you to lose 4 to 8  lbs  per month if you follow it carefully.  Your goal with exercise is a minimum of 30 minutes of aerobic exercise 5 days per week (Walking does not count once it becomes easy!)    All of the foods can be found at grocery stores and in bulk at [employer].  The Atkins protein bars and shakes are available in more varieties at [employer], [employer] and [employer].     7 AM Breakfast:  Choose from the following:  Low carbohydrate Protein  Shakes (I recommend the  Premier Protein chocolate shakes,  EAS AdvantEdge "Carb Control" shakes  Or the Atkins shakes all are under 3 net carbs)     a scrambled egg/bacon/cheese burrito made with Mission's "carb balance" whole wheat tortilla  (about 10 net carbs )  Regulatory affairs officer (basically a quiche without the pastry crust) that is eaten cold and very convenient way to get your eggs.  8 carbs)  If you make your own protein shakes, avoid bananas and pineapple,  And use low carb greek yogurt or original /unsweetened almond or soy milk    Avoid cereal and bananas, oatmeal and cream of wheat and grits. They are loaded with carbohydrates!   10 AM: high protein snack:  Protein bar by Atkins (the snack size, under 200 cal, usually < 6 net carbs).    A stick of cheese:  Around 1 carb,  100 cal     Dannon Light n  Fit [place] Yogurt  (80 cal, 8 carbs)  Other so called "protein bars" and Greek yogurts tend to be loaded with carbohydrates.  Remember, in food advertising, the word "energy" is synonymous for " carbohydrate."  Lunch:   A Sandwich using the bread choices listed, Can use any  Eggs,  lunchmeat, grilled meat or canned tuna), avocado, regular mayo/mustard  and cheese.  A Salad using blue cheese, ranch,  Goddess or vinagrette,  Avoid taco shells, croutons or "confetti" and no "candied nuts" but regular nuts OK.   No pretzels, nabs  or chips.  Pickles and miniature sweet peppers are a good low carb alternative that provide a "crunch"  The bread is the only source of carbohydrate in a sandwich and  can be decreased by trying some of the attached alternatives to traditional loaf bread   Avoid "Low fat dressings, as well as [place] dressings They are loaded with sugar!   3 PM/ Mid day  Snack:  Consider  1 ounce of  almonds, walnuts, pistachios, pecans, peanuts,  Macadamia nuts or a nut medley.  Avoid "granola and granola bars "  Mixed nuts are ok in moderation as long as there are no raisins,  cranberries or dried fruit.   KIND bars  are OK if you get the low glycemic index variety   Try the prosciutto/mozzarella cheese sticks by [employer]  In deli /backery section   High protein      6 PM  Dinner:     Meat/fowl/fish with a green salad, and either broccoli, cauliflower, green beans, spinach, brussel sprouts or  Lima beans. DO NOT BREAD THE PROTEIN!!      There is a low carb pasta by Dreamfield's that is acceptable and tastes great: only 5 digestible carbs/serving.( All grocery stores but BJs carry it ) Several ready made meals are available low carb:   Try Michel Angelo's chicken piccata or chicken or eggplant parm over low carb pasta.(Lowes and BJs)   Marjory Lies Sanchez's "Carnitas" (pulled pork, no sauce,  0 carbs) or his beef pot roast to make a dinner burrito (at [employer]J's)  Pesto over  low carb pasta (bj's sells a good quality pesto in the center refrigerated section of the deli   Try satueeing  Cheral Marker with mushroooms as a good side   [employer] Giant makes a mashed cauliflower that tastes like mashed potatoes  Whole wheat pasta is still full of digestible carbs and  Not as low in glycemic index as Dreamfield's.   Brown rice is still rice,  So skip the rice and noodles if you eat [place] or [place] (or at least limit to 1/2 cup)  9 PM snack :   Breyer's "low carb" fudgsicle or  ice cream bar (Carb Smart line), or  Weight Watcher's ice cream bar , or another "no sugar added" ice cream;  a serving of fresh berries/cherries with whipped cream   Cheese or DANNON'S LlGHT N FIT GREEK YOGURT  8 ounces of [place] unsweetened almond/cococunut milk    Treat yourself to a parfait made with whipped cream blueberiies, walnuts and vanilla greek yogurt  Avoid bananas, pineapple, grapes  and watermelon on a regular basis because they are high in sugar.  THINK OF THEM AS DESSERT  Remember that snack Substitutions should be less than 10 NET carbs per serving and meals < 20 carbs. Remember to subtract fiber grams to get the "net carbs."  @TULLOBREADPACKAGE @  Bupropion extended-release tablets (Depression/Mood Disorders) What is this medicine? BUPROPION (byoo PROE pee on) is used to treat depression. This medicine may be used for other purposes; ask your health care provider or pharmacist if you have questions. COMMON BRAND NAME(S): Aplenzin, Budeprion XL, Forfivo XL, Wellbutrin XL What should I tell my health care provider before I take this medicine? They need to know if you have any of these conditions:  an eating disorder, such as anorexia or bulimia  bipolar disorder or psychosis  diabetes or high blood sugar, treated with medication  glaucoma  head injury or brain tumor  heart disease, previous heart attack, or irregular heart beat  high blood pressure  kidney or liver  disease  seizures (convulsions)  suicidal thoughts or a previous suicide attempt  Tourette's syndrome  weight loss  an unusual or allergic reaction to bupropion, other medicines, foods, dyes, or preservatives  breast-feeding  pregnant or trying to become pregnant How should I use this medicine? Take this medicine by mouth with a glass of water. Follow the directions on the prescription label. You can take it with or without food. If it upsets your stomach, take it with food. Do not crush, chew, or cut these tablets. This medicine is taken once daily at the same time each day. Do not  take your medicine more often than directed. Do not stop taking this medicine suddenly except upon the advice of your doctor. Stopping this medicine too quickly may cause serious side effects or your condition may worsen. A special MedGuide will be given to you by the pharmacist with each prescription and refill. Be sure to read this information carefully each time. Talk to your pediatrician regarding the use of this medicine in children. Special care may be needed. Overdosage: If you think you have taken too much of this medicine contact a poison control center or emergency room at once. NOTE: This medicine is only for you. Do not share this medicine with others. What if I miss a dose? If you miss a dose, skip the missed dose and take your next tablet at the regular time. Do not take double or extra doses. What may interact with this medicine? Do not take this medicine with any of the following medications:  linezolid  MAOIs like Azilect, Carbex, Eldepryl, Marplan, Nardil, and Parnate  methylene blue (injected into a vein)  other medicines that contain bupropion like Zyban This medicine may also interact with the following medications:  alcohol  certain medicines for anxiety or sleep  certain medicines for blood pressure like metoprolol, propranolol  certain medicines for depression or psychotic  disturbances  certain medicines for HIV or AIDS like efavirenz, lopinavir, nelfinavir, ritonavir  certain medicines for irregular heart beat like propafenone, flecainide  certain medicines for Parkinson's disease like amantadine, levodopa  certain medicines for seizures like carbamazepine, phenytoin, phenobarbital  cimetidine  clopidogrel  cyclophosphamide  digoxin  furazolidone  isoniazid  nicotine  orphenadrine  procarbazine  steroid medicines like prednisone or cortisone  stimulant medicines for attention disorders, weight loss, or to stay awake  tamoxifen  theophylline  thiotepa  ticlopidine  tramadol  warfarin This list may not describe all possible interactions. Give your health care provider a list of all the medicines, herbs, non-prescription drugs, or dietary supplements you use. Also tell them if you smoke, drink alcohol, or use illegal drugs. Some items may interact with your medicine. What should I watch for while using this medicine? Tell your doctor if your symptoms do not get better or if they get worse. Visit your doctor or healthcare provider for regular checks on your progress. Because it may take several weeks to see the full effects of this medicine, it is important to continue your treatment as prescribed by your doctor. This medicine may cause serious skin reactions. They can happen weeks to months after starting the medicine. Contact your healthcare provider right away if you notice fevers or flu-like symptoms with a rash. The rash may be red or purple and then turn into blisters or peeling of the skin. Or, you might notice a red rash with swelling of the face, lips or lymph nodes in your neck or under your arms. Patients and their families should watch out for new or worsening thoughts of suicide or depression. Also watch out for sudden changes in feelings such as feeling anxious, agitated, panicky, irritable, hostile, aggressive, impulsive,  severely restless, overly excited and hyperactive, or not being able to sleep. If this happens, especially at the beginning of treatment or after a change in dose, call your healthcare provider. Avoid alcoholic drinks while taking this medicine. Drinking large amounts of alcoholic beverages, using sleeping or anxiety medicines, or quickly stopping the use of these agents while taking this medicine may increase your risk for a seizure. Do not  drive or use heavy machinery until you know how this medicine affects you. This medicine can impair your ability to perform these tasks. Do not take this medicine close to bedtime. It may prevent you from sleeping. Your mouth may get dry. Chewing sugarless gum or sucking hard candy, and drinking plenty of water may help. Contact your doctor if the problem does not go away or is severe. The tablet shell for some brands of this medicine does not dissolve. This is normal. The tablet shell may appear whole in the stool. This is not a cause for concern. What side effects may I notice from receiving this medicine? Side effects that you should report to your doctor or health care professional as soon as possible:  allergic reactions like skin rash, itching or hives, swelling of the face, lips, or tongue  breathing problems  changes in vision  confusion  elevated mood, decreased need for sleep, racing thoughts, impulsive behavior  fast or irregular heartbeat  hallucinations, loss of contact with reality  increased blood pressure  rash, fever, and swollen lymph nodes  redness, blistering, peeling or loosening of the skin, including inside the mouth  seizures  suicidal thoughts or other mood changes  unusually weak or tired  vomiting Side effects that usually do not require medical attention (report to your doctor or health care professional if they continue or are bothersome):  constipation  headache  loss of  appetite  nausea  tremors  weight loss This list may not describe all possible side effects. Call your doctor for medical advice about side effects. You may report side effects to FDA at 1-800-FDA-1088. Where should I keep my medicine? Keep out of the reach of children. Store at room temperature between 15 and 30 degrees C (59 and 86 degrees F). Throw away any unused medicine after the expiration date. NOTE: This sheet is a summary. It may not cover all possible information. If you have questions about this medicine, talk to your doctor, pharmacist, or health care provider.  2020 Elsevier/Gold Standard (2019-01-24 13:45:31)

## 2020-04-29 NOTE — Progress Notes (Signed)
Virtual Visit via Video Note  I connected with@  on 04/29/20 at 10:30 AM EDT by a video enabled telemedicine application and verified that I am speaking with the correct person using two identifiers.  Location patient: home Location provider:work  Persons participating in the virtual visit: patient, provider  I discussed the limitations of evaluation and management by telemedicine and the availability of in person appointments. The patient expressed understanding and agreed to proceed.   HPI: Frustrated by weight and having a  Hard time loosing weight.  Feels active at work, will walk 6 miles. Works in ICU. Eats breakfast , protein shake; eats yogurt mid morning, lunch ( grilled veg, shrimp), and supper ( veg sauteed, [place] meatloaf) at 8pm . Notes starving at dinner after work. No soda, sweet tea.  On days off , will work out for 30 minutes with [employer].  Had felt better when weighed 178lb.   Avoids greasy food. No red meat. Limiting sugar, carbs,  Has been on phentermine in the past.  Feels down related to weight. Some anxiety.  No si/hi.  No h/o seizure, eating disorder. Very occasional alcohol use.  No h/o thyroid cancer or family h/o thyroid cancer.    H/o cholecystectomy   Cold intolerance persists. 'Stay cold even if house is 71 degrees.' Notes being cold at work, at hospital. She feels that this the way 'I am'.   ROS: See pertinent positives and negatives per HPI.  Past Medical History:  Diagnosis Date  . Asthma   . Chronic migraine   . Complication of anesthesia   . DDD (degenerative disc disease), lumbosacral   . Family history of adverse reaction to anesthesia    Mother and Father - PONV  . PONV (postoperative nausea and vomiting)   . Wears contact lenses     Past Surgical History:  Procedure Laterality Date  . CHOLECYSTECTOMY    . PILONIDAL CYST EXCISION    . TONSILECTOMY, ADENOIDECTOMY, BILATERAL MYRINGOTOMY AND TUBES    . TYMPANOPLASTY Right  05/10/2019   Procedure: TYMPANOPLASTY;  Surgeon: Beverly Gust, MD;  Location: [facility];  Service: ENT;  Laterality: Right;    Family History  Problem Relation Age of Onset  . Brain cancer Father 73  . Valvular heart disease Sister   . Brain cancer Paternal Grandmother 98  . COPD Maternal Grandfather   . Thyroid cancer Neg Hx       Current Outpatient Medications:  .  azelastine (ASTELIN) 0.1 % nasal spray, Place 1 spray into both nostrils 2 (two) times daily. Use in each nostril as directed, Disp: 30 mL, Rfl: 0 .  Biotin 1000 MCG tablet, Take 1,000 mcg by mouth daily., Disp: , Rfl:  .  fexofenadine (ALLEGRA) 180 MG tablet, Take 180 mg by mouth daily as needed., Disp: , Rfl:  .  fluconazole (DIFLUCAN) 150 MG tablet, Take 150 mg by mouth once., Disp: , Rfl:  .  gabapentin (NEURONTIN) 100 MG capsule, Take 1 capsule (100 mg total) by mouth at bedtime., Disp: 90 capsule, Rfl: 3 .  meloxicam (MOBIC) 7.5 MG tablet, TAKE 1 TABLET (7.5 MG TOTAL) BY MOUTH DAILY AS NEEDED FOR PAIN., Disp: 90 tablet, Rfl: 0 .  Multiple Vitamin (MULTI-VITAMIN) tablet, Take by mouth., Disp: , Rfl:  .  PROAIR RESPICLICK 621 (90 Base) MCG/ACT AEPB, Inhale 2 Doses into the lungs as needed (every four to six hours for cough or wheeze)., Disp: , Rfl:  .  traZODone (DESYREL) 50 MG tablet, Take  0.5-1 tablets (25-50 mg total) by mouth at bedtime as needed for sleep., Disp: 90 tablet, Rfl: 0 .  VITAMIN D PO, Take 5,000 mg by mouth daily., Disp: , Rfl:  .  buPROPion (WELLBUTRIN XL) 150 MG 24 hr tablet, Start 150 mg ER PO qam, increase after 3 days to 300 mg qam., Disp: 60 tablet, Rfl: 3  EXAM:  VITALS per patient if applicable: Wt Readings from Last 3 Encounters:  04/29/20 193 lb (87.5 kg)  04/16/20 196 lb (88.9 kg)  12/31/19 193 lb (87.5 kg)   Body mass index is 30.68 kg/m.   GENERAL: alert, oriented, appears well and in no acute distress  HEENT: atraumatic, conjunttiva clear, no obvious abnormalities  on inspection of external nose and ears  NECK: normal movements of the head and neck  LUNGS: on inspection no signs of respiratory distress, breathing rate appears normal, no obvious gross SOB, gasping or wheezing  CV: no obvious cyanosis  MS: moves all visible extremities without noticeable abnormality  PSYCH/NEURO: pleasant and cooperative, no obvious depression or anxiety, speech and thought processing grossly intact  ASSESSMENT AND PLAN:  Discussed the following assessment and plan:  Class 1 obesity without serious comorbidity with body mass index (BMI) of 30.0 to 30.9 in adult, unspecified obesity type - Plan: buPROPion (WELLBUTRIN XL) 150 MG 24 hr tablet Problem List Items Addressed This Visit      Other   Obesity - Primary    Discussed low carb diet. Trial wellbutrin in absence of contraindications.  She declined referral to health and wellness, nutrition at this time.  Advised to read AVS Summary on my chart, patient verbalized understanding Close follow up      Relevant Medications   buPROPion (WELLBUTRIN XL) 150 MG 24 hr tablet      -we discussed possible serious and likely etiologies, options for evaluation and workup, limitations of telemedicine visit vs in person visit, treatment, treatment risks and precautions. Pt prefers to treat via telemedicine empirically rather then risking or undertaking an in person visit at this moment. Patient agrees to seek prompt in person care if worsening, new symptoms arise, or if is not improving with treatment.   I discussed the assessment and treatment plan with the patient. The patient was provided an opportunity to ask questions and all were answered. The patient agreed with the plan and demonstrated an understanding of the instructions.   The patient was advised to call back or seek an in-person evaluation if the symptoms worsen or if the condition fails to improve as anticipated.   Mable Paris, FNP   I have spent 25  minutes with a patient  discussion plan of care, dietary changes, medication options.

## 2020-05-01 ENCOUNTER — Telehealth: Payer: Self-pay | Admitting: Family

## 2020-05-01 NOTE — Telephone Encounter (Signed)
lvm pt to schedule 3 month follow up

## 2020-05-27 ENCOUNTER — Telehealth: Payer: Self-pay

## 2020-05-27 NOTE — Telephone Encounter (Signed)
Pt calling; has been cramping for 3wks; has gotten worse; usually has no cramping.  Should she be seen?  [phone]  Pt states this is the first cycle she has had this to happen; it was before and after ovulation; was doubled over by it Mon but it eased off and is now just mild cramping; goes away c tylenol and after it wears off has dull aching; uses condoms; no mishaps that she knows of; no other bc; not trying to conceive; neg preg test today; should start period Monday.  Adv doesn't need to be seen urgently; ball is in her court - if wants to be seen okay; if wants to see what next cycle brings okay;  Adv to wait til next week or week after to repeat preg test in case this one was too early.  Pt opts to wait and see what next cycle brings.

## 2020-06-03 ENCOUNTER — Other Ambulatory Visit: Payer: Self-pay | Admitting: Family

## 2020-06-03 DIAGNOSIS — F411 Generalized anxiety disorder: Secondary | ICD-10-CM

## 2020-06-05 ENCOUNTER — Ambulatory Visit (INDEPENDENT_AMBULATORY_CARE_PROVIDER_SITE_OTHER): Payer: No Typology Code available for payment source | Admitting: Internal Medicine

## 2020-06-05 ENCOUNTER — Encounter: Payer: Self-pay | Admitting: Internal Medicine

## 2020-06-05 ENCOUNTER — Other Ambulatory Visit: Payer: Self-pay

## 2020-06-05 VITALS — BP 128/80 | HR 75 | Temp 98.2°F | Ht 66.5 in | Wt 191.0 lb

## 2020-06-05 DIAGNOSIS — H9202 Otalgia, left ear: Secondary | ICD-10-CM | POA: Diagnosis not present

## 2020-06-05 DIAGNOSIS — R42 Dizziness and giddiness: Secondary | ICD-10-CM | POA: Diagnosis not present

## 2020-06-05 DIAGNOSIS — H9190 Unspecified hearing loss, unspecified ear: Secondary | ICD-10-CM | POA: Diagnosis not present

## 2020-06-05 DIAGNOSIS — H6123 Impacted cerumen, bilateral: Secondary | ICD-10-CM

## 2020-06-05 MED ORDER — MECLIZINE HCL 25 MG PO TABS: 12.5000 mg | ORAL_TABLET | Freq: Three times a day (TID) | ORAL | 0 refills | Status: DC | PRN

## 2020-06-05 MED ORDER — NEOMYCIN-POLYMYXIN-HC 3.5-10000-1 OT SOLN: 3.0000 [drp] | Freq: Four times a day (QID) | OTIC | 0 refills | Status: DC

## 2020-06-05 NOTE — Patient Instructions (Signed)
Meclizine tablets or capsules What is this medicine? MECLIZINE (MEK li zeen) is an antihistamine. It is used to prevent nausea, vomiting, or dizziness caused by motion sickness. It is also used to prevent and treat vertigo (extreme dizziness or a feeling that you or your surroundings are tilting or spinning around). This medicine may be used for other purposes; ask your health care provider or pharmacist if you have questions. COMMON BRAND NAME(S): Antivert, Dramamine Less Drowsy, Dramamine-N, Medivert, Meni-D What should I tell my health care provider before I take this medicine? They need to know if you have any of these conditions:  glaucoma  lung or breathing disease, like asthma  problems urinating  prostate disease  stomach or intestine problems  an unusual or allergic reaction to meclizine, other medicines, foods, dyes, or preservatives  pregnant or trying to get pregnant  breast-feeding How should I use this medicine? Take this medicine by mouth with a glass of water. Follow the directions on the prescription label. If you are using this medicine to prevent motion sickness, take the dose at least 1 hour before travel. If it upsets your stomach, take it with food or milk. Take your doses at regular intervals. Do not take your medicine more often than directed. Talk to your pediatrician regarding the use of this medicine in children. Special care may be needed. Overdosage: If you think you have taken too much of this medicine contact a poison control center or emergency room at once. NOTE: This medicine is only for you. Do not share this medicine with others. What if I miss a dose? If you miss a dose, take it as soon as you can. If it is almost time for your next dose, take only that dose. Do not take double or extra doses. What may interact with this medicine? Do not take this medicine with any of the following medications:  MAOIs like Carbex, Eldepryl, Marplan, Nardil, and  Parnate This medicine may also interact with the following medications:  alcohol  antihistamines for allergy, cough and cold  certain medicines for anxiety or sleep  certain medicines for depression, like amitriptyline, fluoxetine, sertraline  certain medicines for seizures like phenobarbital, primidone  general anesthetics like halothane, isoflurane, methoxyflurane, propofol  local anesthetics like lidocaine, pramoxine, tetracaine  medicines that relax muscles for surgery  narcotic medicines for pain  phenothiazines like chlorpromazine, mesoridazine, prochlorperazine, thioridazine This list may not describe all possible interactions. Give your health care provider a list of all the medicines, herbs, non-prescription drugs, or dietary supplements you use. Also tell them if you smoke, drink alcohol, or use illegal drugs. Some items may interact with your medicine. What should I watch for while using this medicine? Tell your doctor or healthcare professional if your symptoms do not start to get better or if they get worse. You may get drowsy or dizzy. Do not drive, use machinery, or do anything that needs mental alertness until you know how this medicine affects you. Do not stand or sit up quickly, especially if you are an older patient. This reduces the risk of dizzy or fainting spells. Alcohol may interfere with the effect of this medicine. Avoid alcoholic drinks. Your mouth may get dry. Chewing sugarless gum or sucking hard candy, and drinking plenty of water may help. Contact your doctor if the problem does not go away or is severe. This medicine may cause dry eyes and blurred vision. If you wear contact lenses you may feel some discomfort. Lubricating drops may  help. See your eye doctor if the problem does not go away or is severe. What side effects may I notice from receiving this medicine? Side effects that you should report to your doctor or health care professional as soon as  possible:  feeling faint or lightheaded, falls  fast, irregular heartbeat Side effects that usually do not require medical attention (report to your doctor or health care professional if they continue or are bothersome):  constipation  headache  trouble passing urine or change in the amount of urine  trouble sleeping  upset stomach This list may not describe all possible side effects. Call your doctor for medical advice about side effects. You may report side effects to FDA at 1-800-FDA-1088. Where should I keep my medicine? Keep out of the reach of children. Store at room temperature between 15 and 30 degrees C (59 and 86 degrees F). Keep container tightly closed. Throw away any unused medicine after the expiration date. NOTE: This sheet is a summary. It may not cover all possible information. If you have questions about this medicine, talk to your doctor, pharmacist, or health care provider.  2020 Elsevier/Gold Standard (2015-12-02 19:41:02)  Vertigo Vertigo is the feeling that you or the things around you are moving when they are not. This feeling can come and go at any time. Vertigo often goes away on its own. This condition can be dangerous if it happens when you are doing activities like driving or working with machines. Your doctor will do tests to find the cause of your vertigo. These tests will also help your doctor decide on the best treatment for you. Follow these instructions at home: Eating and drinking      Drink enough fluid to keep your pee (urine) pale yellow.  Do not drink alcohol. Activity  Return to your normal activities as told by your doctor. Ask your doctor what activities are safe for you.  In the morning, first sit up on the side of the bed. When you feel okay, stand slowly while you hold onto something until you know that your balance is fine.  Move slowly. Avoid sudden body or head movements or certain positions, as told by your doctor.  Use a  cane if you have trouble standing or walking.  Sit down right away if you feel dizzy.  Avoid doing any tasks or activities that can cause danger to you or others if you get dizzy.  Avoid bending down if you feel dizzy. Place items in your home so that they are easy for you to reach without leaning over.  Do not drive or use heavy machinery if you feel dizzy. General instructions  Take over-the-counter and prescription medicines only as told by your doctor.  Keep all follow-up visits as told by your doctor. This is important. Contact a doctor if:  Your medicine does not help your vertigo.  You have a fever.  Your problems get worse or you have new symptoms.  Your family or friends see changes in your behavior.  The feeling of being sick to your stomach gets worse.  Your vomiting gets worse.  You lose feeling (have numbness) in part of your body.  You feel prickling and tingling in a part of your body. Get help right away if:  You have trouble moving or talking.  You are always dizzy.  You pass out (faint).  You get very bad headaches.  You feel weak in your hands, arms, or legs.  You have changes  in your hearing.  You have changes in how you see (vision).  You get a stiff neck.  Bright light starts to bother you. Summary  Vertigo is the feeling that you or the things around you are moving when they are not.  Your doctor will do tests to find the cause of your vertigo.  You may be told to avoid some tasks, positions, or movements.  Contact a doctor if your medicine is not helping, or if you have a fever, new symptoms, or a change in behavior.  Get help right away if you get very bad headaches, or if you have changes in how you speak, hear, or see. This information is not intended to replace advice given to you by your health care provider. Make sure you discuss any questions you have with your health care provider. Document Revised: 09/24/2018 Document  Reviewed: 09/24/2018 Elsevier Patient Education  [place].  Dizziness Dizziness is a common problem. It is a feeling of unsteadiness or light-headedness. You may feel like you are about to faint. Dizziness can lead to injury if you stumble or fall. Anyone can become dizzy, but dizziness is more common in older adults. This condition can be caused by a number of things, including medicines, dehydration, or illness. Follow these instructions at home: Eating and drinking  Drink enough fluid to keep your urine clear or pale yellow. This helps to keep you from becoming dehydrated. Try to drink more clear fluids, such as water.  Do not drink alcohol.  Limit your caffeine intake if told to do so by your health care provider. Check ingredients and nutrition facts to see if a food or beverage contains caffeine.  Limit your salt (sodium) intake if told to do so by your health care provider. Check ingredients and nutrition facts to see if a food or beverage contains sodium. Activity  Avoid making quick movements. ? Rise slowly from chairs and steady yourself until you feel okay. ? In the morning, first sit up on the side of the bed. When you feel okay, stand slowly while you hold onto something until you know that your balance is fine.  If you need to stand in one place for a long time, move your legs often. Tighten and relax the muscles in your legs while you are standing.  Do not drive or use heavy machinery if you feel dizzy.  Avoid bending down if you feel dizzy. Place items in your home so that they are easy for you to reach without leaning over. Lifestyle  Do not use any products that contain nicotine or tobacco, such as cigarettes and e-cigarettes. If you need help quitting, ask your health care provider.  Try to reduce your stress level by using methods such as yoga or meditation. Talk with your health care provider if you need help to manage your stress. General  instructions  Watch your dizziness for any changes.  Take over-the-counter and prescription medicines only as told by your health care provider. Talk with your health care provider if you think that your dizziness is caused by a medicine that you are taking.  Tell a friend or a family member that you are feeling dizzy. If he or she notices any changes in your behavior, have this person call your health care provider.  Keep all follow-up visits as told by your health care provider. This is important. Contact a health care provider if:  Your dizziness does not go away.  Your dizziness or  light-headedness gets worse.  You feel nauseous.  You have reduced hearing.  You have new symptoms.  You are unsteady on your feet or you feel like the room is spinning. Get help right away if:  You vomit or have diarrhea and are unable to eat or drink anything.  You have problems talking, walking, swallowing, or using your arms, hands, or legs.  You feel generally weak.  You are not thinking clearly or you have trouble forming sentences. It may take a friend or family member to notice this.  You have chest pain, abdominal pain, shortness of breath, or sweating.  Your vision changes.  You have any bleeding.  You have a severe headache.  You have neck pain or a stiff neck.  You have a fever. These symptoms may represent a serious problem that is an emergency. Do not wait to see if the symptoms will go away. Get medical help right away. Call your local emergency services (911 in the [place] Do not drive yourself to the hospital. Summary  Dizziness is a feeling of unsteadiness or light-headedness. This condition can be caused by a number of things, including medicines, dehydration, or illness.  Anyone can become dizzy, but dizziness is more common in older adults.  Drink enough fluid to keep your urine clear or pale yellow. Do not drink alcohol.  Avoid making quick movements if you feel  dizzy. Monitor your dizziness for any changes. This information is not intended to replace advice given to you by your health care provider. Make sure you discuss any questions you have with your health care provider. Document Revised: 11/03/2017 Document Reviewed: 12/03/2016 Elsevier Patient Education  2020 [place], Adult The ears produce a substance called earwax that helps keep bacteria out of the ear and protects the skin in the ear canal. Occasionally, earwax can build up in the ear and cause discomfort or hearing loss. What increases the risk? This condition is more likely to develop in people who:  Are female.  Are elderly.  Naturally produce more earwax.  Clean their ears often with cotton swabs.  Use earplugs often.  Use in-ear headphones often.  Wear hearing aids.  Have narrow ear canals.  Have earwax that is overly thick or sticky.  Have eczema.  Are dehydrated.  Have excess hair in the ear canal. What are the signs or symptoms? Symptoms of this condition include:  Reduced or muffled hearing.  A feeling of fullness in the ear or feeling that the ear is plugged.  Fluid coming from the ear.  Ear pain.  Ear itch.  Ringing in the ear.  Coughing.  An obvious piece of earwax that can be seen inside the ear canal. How is this diagnosed? This condition may be diagnosed based on:  Your symptoms.  Your medical history.  An ear exam. During the exam, your health care provider will look into your ear with an instrument called an otoscope. You may have tests, including a hearing test. How is this treated? This condition may be treated by:  Using ear drops to soften the earwax.  Having the earwax removed by a health care provider. The health care provider may: ? Flush the ear with water. ? Use an instrument that has a loop on the end (curette). ? Use a suction device.  Surgery to remove the wax buildup. This may be done in severe  cases. Follow these instructions at home:   Take over-the-counter and prescription medicines only  as told by your health care provider.  Do not put any objects, including cotton swabs, into your ear. You can clean the opening of your ear canal with a washcloth or facial tissue.  Follow instructions from your health care provider about cleaning your ears. Do not over-clean your ears.  Drink enough fluid to keep your urine clear or pale yellow. This will help to thin the earwax.  Keep all follow-up visits as told by your health care provider. If earwax builds up in your ears often or if you use hearing aids, consider seeing your health care provider for routine, preventive ear cleanings. Ask your health care provider how often you should schedule your cleanings.  If you have hearing aids, clean them according to instructions from the manufacturer and your health care provider. Contact a health care provider if:  You have ear pain.  You develop a fever.  You have blood, pus, or other fluid coming from your ear.  You have hearing loss.  You have ringing in your ears that does not go away.  Your symptoms do not improve with treatment.  You feel like the room is spinning (vertigo). Summary  Earwax can build up in the ear and cause discomfort or hearing loss.  The most common symptoms of this condition include reduced or muffled hearing and a feeling of fullness in the ear or feeling that the ear is plugged.  This condition may be diagnosed based on your symptoms, your medical history, and an ear exam.  This condition may be treated by using ear drops to soften the earwax or by having the earwax removed by a health care provider.  Do not put any objects, including cotton swabs, into your ear. You can clean the opening of your ear canal with a washcloth or facial tissue. This information is not intended to replace advice given to you by your health care provider. Make sure you  discuss any questions you have with your health care provider. Document Revised: 10/13/2017 Document Reviewed: 01/11/2017 Elsevier Patient Education  2020 [employer].

## 2020-06-05 NOTE — Progress Notes (Addendum)
Chief Complaint  Patient presents with  . Hearing Problem    Left hear  . Dizziness  . Nausea   F/u  1. Left ear hearing loss yesterday and associated dizziness/nausea room spinning tried nausea. Still some dizziness today and she almost feel at work. No recent uri sx's ent Dr. Tami Ribas and prior TM repair in the past on right. No water in her ear    Review of Systems  HENT: Positive for hearing loss. Negative for ear discharge, sore throat and tinnitus.   Respiratory: Negative for cough.   Cardiovascular: Negative for chest pain.   Past Medical History:  Diagnosis Date  . Asthma   . Chronic migraine   . Complication of anesthesia   . DDD (degenerative disc disease), lumbosacral   . Family history of adverse reaction to anesthesia    Mother and Father - PONV  . PONV (postoperative nausea and vomiting)   . Wears contact lenses    Past Surgical History:  Procedure Laterality Date  . CHOLECYSTECTOMY    . PILONIDAL CYST EXCISION    . TONSILECTOMY, ADENOIDECTOMY, BILATERAL MYRINGOTOMY AND TUBES    . TYMPANOPLASTY Right 05/10/2019   Procedure: TYMPANOPLASTY;  Surgeon: Beverly Gust, MD;  Location: [facility];  Service: ENT;  Laterality: Right;   Family History  Problem Relation Age of Onset  . Brain cancer Father 46  . Valvular heart disease Sister   . Brain cancer Paternal Grandmother 75  . COPD Maternal Grandfather   . Thyroid cancer Neg Hx    Social History   Socioeconomic History  . Marital status: Married    Spouse name: Not on file  . Number of children: Not on file  . Years of education: Not on file  . Highest education level: Not on file  Occupational History  . Not on file  Tobacco Use  . Smoking status: Never Smoker  . Smokeless tobacco: Never Used  Vaping Use  . Vaping Use: Never used  Substance and Sexual Activity  . Alcohol use: Yes    Alcohol/week: 2.0 standard drinks    Types: 2 Standard drinks or equivalent per week    Comment: Social    . Drug use: Never  . Sexual activity: Yes    Birth control/protection: Condom  Other Topics Concern  . Not on file  Social History Narrative   Works as Designer, multimedia in ICU   2 children   Social Determinants of Radio broadcast assistant Strain:   . Difficulty of Paying Living Expenses:   Food Insecurity:   . Worried About Charity fundraiser in the Last Year:   . Arboriculturist in the Last Year:   Transportation Needs:   . Film/video editor (Medical):   Marland Kitchen Lack of Transportation (Non-Medical):   Physical Activity:   . Days of Exercise per Week:   . Minutes of Exercise per Session:   Stress:   . Feeling of Stress :   Social Connections:   . Frequency of Communication with Friends and Family:   . Frequency of Social Gatherings with Friends and Family:   . Attends Religious Services:   . Active Member of Clubs or Organizations:   . Attends Archivist Meetings:   Marland Kitchen Marital Status:   Intimate Partner Violence:   . Fear of Current or Ex-Partner:   . Emotionally Abused:   Marland Kitchen Physically Abused:   . Sexually Abused:    Current Meds  Medication Sig  .  azelastine (ASTELIN) 0.1 % nasal spray Place 1 spray into both nostrils 2 (two) times daily. Use in each nostril as directed  . Biotin 1000 MCG tablet Take 1,000 mcg by mouth daily.  Marland Kitchen buPROPion (WELLBUTRIN XL) 150 MG 24 hr tablet Start 150 mg ER PO qam, increase after 3 days to 300 mg qam.  . fexofenadine (ALLEGRA) 180 MG tablet Take 180 mg by mouth daily as needed.  . gabapentin (NEURONTIN) 100 MG capsule Take 1 capsule (100 mg total) by mouth at bedtime.  . Multiple Vitamin (MULTI-VITAMIN) tablet Take by mouth.  . traZODone (DESYREL) 50 MG tablet TAKE 1/2 TO 1 TABLET BY MOUTH AT BEDTIME AS NEEDED FOR SLEEP.  Marland Kitchen VITAMIN D PO Take 5,000 mg by mouth daily.   Allergies  Allergen Reactions  . Azithromycin Hives, Shortness Of Breath and Swelling    Throat swelling  . Sulfa Antibiotics Hives  . Ciprofloxacin Swelling  .  Moxifloxacin Hcl Hives  . Bactrim [Sulfamethoxazole-Trimethoprim] Rash   Recent Results (from the past 2160 hour(s))  Cervicovaginal ancillary only     Status: None   Collection Time: 04/16/20 10:23 AM  Result Value Ref Range   Bacterial Vaginitis (gardnerella) Negative    Candida Vaginitis Negative    Candida Glabrata Negative    Comment Normal Reference Range Candida Species - Negative    Comment Normal Reference Range Candida Galbrata - Negative    Comment      Normal Reference Range Bacterial Vaginosis - Negative   Objective  Body mass index is 30.37 kg/m. Wt Readings from Last 3 Encounters:  06/05/20 191 lb (86.6 kg)  04/29/20 193 lb (87.5 kg)  04/16/20 196 lb (88.9 kg)   Temp Readings from Last 3 Encounters:  06/05/20 98.2 F (36.8 C) (Oral)  01/14/20 97.6 F (36.4 C) (Temporal)  12/31/19 98.4 F (36.9 C) (Temporal)   BP Readings from Last 3 Encounters:  06/05/20 128/80  04/29/20 94/62  04/16/20 125/80   Pulse Readings from Last 3 Encounters:  06/05/20 75  04/16/20 88  01/14/20 60    Physical Exam Vitals and nursing note reviewed.  Constitutional:      Appearance: Normal appearance. She is well-developed and well-groomed.  HENT:     Head: Normocephalic and atraumatic.     Right Ear: There is impacted cerumen.     Left Ear: There is impacted cerumen.  Eyes:     Conjunctiva/sclera: Conjunctivae normal.     Pupils: Pupils are equal, round, and reactive to light.  Cardiovascular:     Rate and Rhythm: Normal rate and regular rhythm.     Heart sounds: Normal heart sounds.  Pulmonary:     Effort: Pulmonary effort is normal.     Breath sounds: Normal breath sounds.  Skin:    General: Skin is warm and dry.  Neurological:     General: No focal deficit present.     Mental Status: She is alert and oriented to person, place, and time. Mental status is at baseline.     Gait: Gait normal.  Psychiatric:        Attention and Perception: Attention and perception  normal.        Mood and Affect: Mood and affect normal.        Speech: Speech normal.        Behavior: Behavior normal. Behavior is cooperative.        Thought Content: Thought content normal.        Cognition and Memory:  Cognition and memory normal.        Judgment: Judgment normal.     Assessment  Plan  Bilateral impacted cerumen with hearing loss  Consented and tolerated procedure removed wax with currette bl ears and tolerated most wax removed some still in right ear. Wax worse in left ear   F/u ENT Dr. Tami Ribas Hearing loss improved some after wax removal but we also discussed possible side effects wellbutrin xl 150 mg qd hearing loss, tinniitis, dizziness as well and she may want to stop this in the future  Vertigo - Plan: meclizine (ANTIVERT) 25 MG tablet, neomycin-polymyxin-hydrocortisone (CORTISPORIN) OTIC solution  Left ear pain - Plan: meclizine (ANTIVERT) 25 MG tablet, neomycin-polymyxin-hydrocortisone (CORTISPORIN) OTIC solution  Provider: Dr. Olivia Mackie McLean-Scocuzza-[facility]
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
## 2020-07-22 ENCOUNTER — Ambulatory Visit: Payer: No Typology Code available for payment source | Admitting: Family

## 2020-08-31 ENCOUNTER — Encounter: Payer: Self-pay | Admitting: Family

## 2020-09-02 NOTE — Telephone Encounter (Signed)
LMTCB

## 2020-09-08 ENCOUNTER — Ambulatory Visit: Payer: No Typology Code available for payment source | Attending: Internal Medicine

## 2020-09-08 ENCOUNTER — Other Ambulatory Visit: Payer: Self-pay | Admitting: Internal Medicine

## 2020-09-08 DIAGNOSIS — Z23 Encounter for immunization: Secondary | ICD-10-CM

## 2020-09-08 NOTE — Progress Notes (Signed)
   Covid-[facility] Vaccination [facility]  Name:  Shoshana Johal    MRN: [id number] DOB: 02/27/1986  09/08/2020  Ms. Bartles was observed post Covid-19 immunization for 15 minutes without incident. She was provided with Vaccine Information Sheet and instruction to access the V-Safe system.   Ms. Coye was instructed to call 911 with any severe reactions post vaccine: Marland Kitchen Difficulty breathing  . Swelling of face and throat  . A fast heartbeat  . A bad rash all over body  . Dizziness and weakness

## 2020-09-25 ENCOUNTER — Ambulatory Visit: Payer: No Typology Code available for payment source | Admitting: Family

## 2020-09-30 ENCOUNTER — Ambulatory Visit: Payer: No Typology Code available for payment source | Admitting: Family

## 2020-09-30 DIAGNOSIS — Z0289 Encounter for other administrative examinations: Secondary | ICD-10-CM

## 2020-10-27 ENCOUNTER — Encounter: Payer: Self-pay | Admitting: Family

## 2020-10-28 ENCOUNTER — Other Ambulatory Visit: Payer: Self-pay | Admitting: Family

## 2020-10-28 DIAGNOSIS — M545 Low back pain, unspecified: Secondary | ICD-10-CM

## 2020-10-28 MED ORDER — GABAPENTIN 100 MG PO CAPS: 200.0000 mg | ORAL_CAPSULE | Freq: Every day | ORAL | 3 refills | Status: DC

## 2020-12-07 ENCOUNTER — Encounter: Payer: Self-pay | Admitting: Family

## 2020-12-09 ENCOUNTER — Other Ambulatory Visit: Payer: Self-pay | Admitting: Family

## 2020-12-09 DIAGNOSIS — G43009 Migraine without aura, not intractable, without status migrainosus: Secondary | ICD-10-CM

## 2020-12-09 MED ORDER — TOPIRAMATE 50 MG PO TABS: 50.0000 mg | ORAL_TABLET | Freq: Two times a day (BID) | ORAL | 1 refills | Status: DC

## 2020-12-09 MED ORDER — TOPIRAMATE 25 MG PO TABS: 25.0000 mg | ORAL_TABLET | Freq: Two times a day (BID) | ORAL | 1 refills | Status: DC

## 2020-12-27 ENCOUNTER — Encounter: Payer: Self-pay | Admitting: Family

## 2020-12-30 ENCOUNTER — Telehealth (INDEPENDENT_AMBULATORY_CARE_PROVIDER_SITE_OTHER): Payer: No Typology Code available for payment source | Admitting: Family

## 2020-12-30 ENCOUNTER — Encounter: Payer: Self-pay | Admitting: Family

## 2020-12-30 VITALS — BP 110/68 | Ht 66.5 in | Wt 193.0 lb

## 2020-12-30 DIAGNOSIS — R197 Diarrhea, unspecified: Secondary | ICD-10-CM | POA: Diagnosis not present

## 2020-12-30 NOTE — Patient Instructions (Addendum)
Stool studies- please [facility] to [facility]  Referral to GI  Let us know if you dont hear back within a week in regards to an appointment being scheduled.   Decrease or wean off completely topamax and see if loose stools improve.   Follow FOD map diet.   Let me know how you are doing.  [email or web address]  Low-FODMAP Eating Plan  FODMAP stands for fermentable oligosaccharides, disaccharides, monosaccharides, and polyols. These are sugars that are hard for some people to digest. A low-FODMAP eating plan may help some people who have irritable bowel syndrome (IBS) and certain other bowel (intestinal) diseases to manage their symptoms. This meal plan can be complicated to follow. Work with a diet and nutrition specialist (dietitian) to make a low-FODMAP eating plan that is right for you. A dietitian can help make sure that you get enough nutrition from this diet. What are tips for following this plan? Reading food labels  Check labels for hidden FODMAPs such as: ? High-fructose syrup. ? Honey. ? Agave. ? Natural fruit flavors. ? Onion or garlic powder.  Choose low-FODMAP foods that contain 3-4 grams of fiber per serving.  Check food labels for serving sizes. Eat only one serving at a time to make sure FODMAP levels stay low. Shopping  Shop with a list of foods that are recommended on this diet and make a meal plan. Meal planning  Follow a low-FODMAP eating plan for up to 6 weeks, or as told by your health care provider or dietitian.  To follow the eating plan: 1. Eliminate high-FODMAP foods from your diet completely. Choose only low-FODMAP foods to eat. You will do this for 2-6 weeks. 2. Gradually reintroduce high-FODMAP foods into your diet one at a time. Most people should wait a few days before introducing the next new high-FODMAP food into their meal plan. Your dietitian can recommend how quickly  you may reintroduce foods. 3. Keep a daily record of what and how much you eat and drink. Make note of any symptoms that you have after eating. 4. Review your daily record with a dietitian regularly to identify which foods you can eat and which foods you should avoid. General tips  Drink enough fluid each day to keep your urine pale yellow.  Avoid processed foods. These often have added sugar and may be high in FODMAPs.  Avoid most dairy products, whole grains, and sweeteners.  Work with a dietitian to make sure you get enough fiber in your diet.  Avoid high FODMAP foods at meals to manage symptoms. Recommended foods Fruits Bananas, oranges, tangerines, lemons, limes, blueberries, raspberries, strawberries, grapes, cantaloupe, honeydew melon, kiwi, papaya, passion fruit, and pineapple. Limited amounts of dried cranberries, banana chips, and shredded coconut. Vegetables Eggplant, zucchini, cucumber, peppers, green beans, bean sprouts, lettuce, arugula, kale, Swiss chard, spinach, collard greens, bok choy, summer squash, potato, and tomato. Limited amounts of corn, carrot, and sweet potato. Green parts of scallions. Grains Gluten-free grains, such as rice, oats, buckwheat, quinoa, corn, polenta, and millet. Gluten-free pasta, bread, or cereal. Rice noodles. Corn tortillas. Meats and other proteins Unseasoned beef, pork, poultry, or fish. Eggs. Berniece Salines. Tofu (firm) and tempeh. Limited amounts of nuts and seeds, such as almonds, walnuts, [place] nuts, pecans, peanuts, nut butters, pumpkin seeds, chia seeds, and sunflower seeds. Dairy Lactose-free milk, yogurt, and kefir. Lactose-free cottage cheese and ice cream. Non-dairy milks, such as almond, coconut, hemp, and rice milk. Non-dairy yogurt. Limited amounts of goat cheese, brie, mozzarella, parmesan, swiss, and other  hard cheeses. Fats and oils Butter-free spreads. Vegetable oils, such as olive, canola, and sunflower oil. Seasoning and other  foods Artificial sweeteners with names that do not end in "ol," such as aspartame, saccharine, and stevia. Maple syrup, white table sugar, raw sugar, brown sugar, and molasses. Mayonnaise, soy sauce, and tamari. Fresh basil, coriander, parsley, rosemary, and thyme. Beverages Water and mineral water. Sugar-sweetened soft drinks. Small amounts of orange juice or cranberry juice. Black and green tea. Most dry wines. Coffee. The items listed above may not be a complete list of foods and beverages you can eat. Contact a dietitian for more information. Foods to avoid Fruits Fresh, dried, and juiced forms of apple, pear, watermelon, peach, plum, cherries, apricots, blackberries, boysenberries, figs, nectarines, and mango. Avocado. Vegetables Chicory root, artichoke, asparagus, cabbage, snow peas, Brussels sprouts, broccoli, sugar snap peas, mushrooms, celery, and cauliflower. Onions, garlic, leeks, and the white part of scallions. Grains Wheat, including kamut, durum, and semolina. Barley and bulgur. Couscous. Wheat-based cereals. Wheat noodles, bread, crackers, and pastries. Meats and other proteins Fried or fatty meat. Sausage. Cashews and pistachios. Soybeans, baked beans, black beans, chickpeas, kidney beans, fava beans, navy beans, lentils, black-eyed peas, and split peas. Dairy Milk, yogurt, ice cream, and soft cheese. Cream and sour cream. Milk-based sauces. Custard. Buttermilk. Soy milk. Seasoning and other foods Any sugar-free gum or candy. Foods that contain artificial sweeteners such as sorbitol, mannitol, isomalt, or xylitol. Foods that contain honey, high-fructose corn syrup, or agave. Bouillon, vegetable stock, beef stock, and chicken stock. Garlic and onion powder. Condiments made with onion, such as hummus, chutney, pickles, relish, salad dressing, and salsa. Tomato paste. Beverages Chicory-based drinks. Coffee substitutes. Chamomile tea. Fennel tea. Sweet or fortified wines such as port  or sherry. Diet soft drinks made with isomalt, mannitol, maltitol, sorbitol, or xylitol. Apple, pear, and mango juice. Juices with high-fructose corn syrup. The items listed above may not be a complete list of foods and beverages you should avoid. Contact a dietitian for more information. Summary  FODMAP stands for fermentable oligosaccharides, disaccharides, monosaccharides, and polyols. These are sugars that are hard for some people to digest.  A low-FODMAP eating plan is a short-term diet that helps to ease symptoms of certain bowel diseases.  The eating plan usually lasts up to 6 weeks. After that, high-FODMAP foods are reintroduced gradually and one at a time. This can help you find out which foods may be causing symptoms.  A low-FODMAP eating plan can be complicated. It is best to work with a dietitian who has experience with this type of plan. This information is not intended to replace advice given to you by your health care provider. Make sure you discuss any questions you have with your health care provider. Document Revised: 03/19/2020 Document Reviewed: 03/19/2020 Elsevier Patient Education  [place].

## 2020-12-30 NOTE — Progress Notes (Signed)
Virtual Visit via Video Note  I connected with@  on 12/30/20 at  8:00 AM EST by a video enabled telemedicine application and verified that I am speaking with the correct person using two identifiers.  Location patient: home Location provider:work  Persons participating in the virtual visit: patient, provider  I discussed the limitations of evaluation and management by telemedicine and the availability of in person appointments. The patient expressed understanding and agreed to proceed.   HPI: Complains of loose brown stools, approx 4-5 per day, x 2 weeks, worsening. Diarrhea is always in the mornings before eating and then one hour after a meal. This can last into the afternoon.  Endorses abdominal cramping prior to bowel movement, then resolves and she will feel '100% better'. Endorses No blood in stool. No fever, nausea, vomiting. Loose stool hasnt awaken her from sleep.  Has lost 12 lbs. Eating in moderation and avoids fried foods. She ate baked chicken, a little macaroni, corn for dinner. Noticed protein doesn't bother her stomach as much. Usually drinks coffee however has stopped this week due to loose stools.   Describes that she has 'always had stomach problems'; this is not new.  Sugar has been typically a trigger for loose stools and she has been eating less sugar, less carbs. She is eating yogurt for breakfast, probiotics daily. Using [place] fruit on occasional. Has been using  No recent antibiotics, recent travel, unusual foods.  She has been doing Keto diet however was doing this diet prior to loose stools.  Onset of symptom does correlate with increase of topamax to 50mg . She hasnt had any migraines for one month.  Tried pepto bismal with temporary relief.   H/o cholescyctectomy   ROS: See pertinent positives and negatives per HPI.    EXAM:  VITALS per patient if applicable: BP 956/38   Ht 5' 6.5" (1.689 m)   Wt 193 lb (87.5 kg)   BMI 30.68 kg/m  BP Readings from Last  3 Encounters:  12/30/20 110/68  06/05/20 128/80  04/29/20 94/62   Wt Readings from Last 3 Encounters:  12/30/20 193 lb (87.5 kg)  06/05/20 191 lb (86.6 kg)  04/29/20 193 lb (87.5 kg)    GENERAL: alert, oriented, appears well and in no acute distress  HEENT: atraumatic, conjunttiva clear, no obvious abnormalities on inspection of external nose and ears  NECK: normal movements of the head and neck  LUNGS: on inspection no signs of respiratory distress, breathing rate appears normal, no obvious gross SOB, gasping or wheezing  CV: no obvious cyanosis  MS: moves all visible extremities without noticeable abnormality  PSYCH/NEURO: pleasant and cooperative, no obvious depression or anxiety, speech and thought processing grossly intact  ASSESSMENT AND PLAN:  Discussed the following assessment and plan:  Problem List Items Addressed This Visit      Other   Diarrhea - Primary    Loose stools.Symptoms of diarrhea for years.  Patient is non toxic in appearance. Afebrile. Differential includes IBS , medication side effect. 2-11% experience diarrhea on topamax. Discussed Monk fruit and advised to hold. Weight loss which I suspect is related to dietary changes ( Keto), side effect of topamax.  Pending stool studies, referral to GI to evaluate for IBD, trial of FOD MAP Diet and reduction of topamax from 50mg  to 25mg . She will let me know how she is doing.       Relevant Orders   Gastrointestinal Pathogen Panel PCR   C Difficile Quick Screen w PCR reflex  Stool, WBC/Lactoferrin   Ambulatory referral to Gastroenterology   Fecal occult blood, imunochemical      -we discussed possible serious and likely etiologies, options for evaluation and workup, limitations of telemedicine visit vs in person visit, treatment, treatment risks and precautions. Pt prefers to treat via telemedicine empirically rather then risking or undertaking an in person visit at this moment.  .   I discussed the  assessment and treatment plan with the patient. The patient was provided an opportunity to ask questions and all were answered. The patient agreed with the plan and demonstrated an understanding of the instructions.   The patient was advised to call back or seek an in-person evaluation if the symptoms worsen or if the condition fails to improve as anticipated.   Mable Paris, FNP

## 2020-12-30 NOTE — Assessment & Plan Note (Addendum)
Loose stools.Symptoms of diarrhea for years.  Patient is non toxic in appearance. Afebrile. Differential includes IBS , medication side effect. 2-11% experience diarrhea on topamax. Discussed Monk fruit and advised to hold. Weight loss which I suspect is related to dietary changes ( Keto), side effect of topamax.  Pending stool studies, referral to GI to evaluate for IBD, trial of FOD MAP Diet and reduction of topamax from 50mg  to 25mg . She will let me know how she is doing.

## 2021-01-01 ENCOUNTER — Encounter: Payer: Self-pay | Admitting: Family

## 2021-01-01 ENCOUNTER — Other Ambulatory Visit
Admission: RE | Admit: 2021-01-01 | Discharge: 2021-01-01 | Disposition: A | Discharge status: Home / Self Care | Payer: No Typology Code available for payment source | Source: Ambulatory Visit | Attending: Gastroenterology | Admitting: Gastroenterology

## 2021-01-01 DIAGNOSIS — R197 Diarrhea, unspecified: Secondary | ICD-10-CM | POA: Diagnosis not present

## 2021-01-01 LAB — GASTROINTESTINAL PANEL BY PCR, STOOL (REPLACES STOOL CULTURE)

## 2021-01-01 LAB — C DIFFICILE QUICK SCREEN W PCR REFLEX
C Diff antigen: NEGATIVE
C Diff interpretation: NOT DETECTED
C Diff toxin: NEGATIVE

## 2021-01-01 LAB — OCCULT BLOOD X 1 CARD TO LAB, STOOL: Fecal Occult Bld: POSITIVE — AB

## 2021-01-01 LAB — LACTOFERRIN, FECAL, QUALITATIVE: Lactoferrin, Fecal, Qual: NEGATIVE

## 2021-01-11 ENCOUNTER — Ambulatory Visit (INDEPENDENT_AMBULATORY_CARE_PROVIDER_SITE_OTHER): Payer: No Typology Code available for payment source | Admitting: Obstetrics and Gynecology

## 2021-01-11 ENCOUNTER — Ambulatory Visit
Admission: RE | Admit: 2021-01-11 | Discharge: 2021-01-11 | Disposition: A | Discharge status: Home / Self Care | Payer: No Typology Code available for payment source | Source: Ambulatory Visit | Attending: Obstetrics and Gynecology | Admitting: Obstetrics and Gynecology

## 2021-01-11 ENCOUNTER — Encounter: Payer: Self-pay | Admitting: Obstetrics and Gynecology

## 2021-01-11 ENCOUNTER — Other Ambulatory Visit: Payer: Self-pay

## 2021-01-11 ENCOUNTER — Other Ambulatory Visit: Payer: Self-pay | Admitting: Family

## 2021-01-11 ENCOUNTER — Ambulatory Visit: Payer: Self-pay | Admitting: Obstetrics and Gynecology

## 2021-01-11 ENCOUNTER — Telehealth: Payer: Self-pay

## 2021-01-11 ENCOUNTER — Telehealth: Payer: Self-pay | Admitting: Family

## 2021-01-11 ENCOUNTER — Encounter: Payer: Self-pay | Admitting: Family

## 2021-01-11 ENCOUNTER — Ambulatory Visit (INDEPENDENT_AMBULATORY_CARE_PROVIDER_SITE_OTHER): Payer: No Typology Code available for payment source | Admitting: Family

## 2021-01-11 VITALS — BP 98/62 | HR 92 | Temp 98.2°F | Ht 67.25 in | Wt 187.6 lb

## 2021-01-11 VITALS — BP 120/72 | Ht 67.0 in | Wt 196.4 lb

## 2021-01-11 DIAGNOSIS — Z0000 Encounter for general adult medical examination without abnormal findings: Secondary | ICD-10-CM | POA: Insufficient documentation

## 2021-01-11 DIAGNOSIS — N923 Ovulation bleeding: Secondary | ICD-10-CM | POA: Insufficient documentation

## 2021-01-11 DIAGNOSIS — O26899 Other specified pregnancy related conditions, unspecified trimester: Secondary | ICD-10-CM | POA: Insufficient documentation

## 2021-01-11 DIAGNOSIS — G43009 Migraine without aura, not intractable, without status migrainosus: Secondary | ICD-10-CM | POA: Diagnosis not present

## 2021-01-11 DIAGNOSIS — Z0131 Encounter for examination of blood pressure with abnormal findings: Secondary | ICD-10-CM

## 2021-01-11 DIAGNOSIS — M545 Low back pain, unspecified: Secondary | ICD-10-CM | POA: Diagnosis not present

## 2021-01-11 DIAGNOSIS — O00102 Left tubal pregnancy without intrauterine pregnancy: Secondary | ICD-10-CM

## 2021-01-11 DIAGNOSIS — R102 Pelvic and perineal pain: Secondary | ICD-10-CM | POA: Insufficient documentation

## 2021-01-11 DIAGNOSIS — R197 Diarrhea, unspecified: Secondary | ICD-10-CM

## 2021-01-11 DIAGNOSIS — Z3490 Encounter for supervision of normal pregnancy, unspecified, unspecified trimester: Secondary | ICD-10-CM | POA: Insufficient documentation

## 2021-01-11 DIAGNOSIS — G8929 Other chronic pain: Secondary | ICD-10-CM

## 2021-01-11 LAB — COMPREHENSIVE METABOLIC PANEL WITH GFR
ALT: 16 U/L (ref 0–35)
AST: 16 U/L (ref 0–37)
Albumin: 4.4 g/dL (ref 3.5–5.2)
Alkaline Phosphatase: 68 U/L (ref 39–117)
BUN: 12 mg/dL (ref 6–23)
CO2: 25 meq/L (ref 19–32)
Calcium: 9.7 mg/dL (ref 8.4–10.5)
Chloride: 107 meq/L (ref 96–112)
Creatinine, Ser: 0.62 mg/dL (ref 0.40–1.20)
GFR: 116.92 mL/min
Glucose, Bld: 79 mg/dL (ref 70–99)
Potassium: 4.5 meq/L (ref 3.5–5.1)
Sodium: 138 meq/L (ref 135–145)
Total Bilirubin: 1 mg/dL (ref 0.2–1.2)
Total Protein: 6.5 g/dL (ref 6.0–8.3)

## 2021-01-11 LAB — CBC WITH DIFFERENTIAL/PLATELET
Basophils Absolute: 0 10*3/uL (ref 0.0–0.1)
Basophils Relative: 0.5 % (ref 0.0–3.0)
Eosinophils Absolute: 0.2 10*3/uL (ref 0.0–0.7)
Eosinophils Relative: 2.2 % (ref 0.0–5.0)
HCT: 44.6 % (ref 36.0–46.0)
Hemoglobin: 14.9 g/dL (ref 12.0–15.0)
Lymphocytes Relative: 34.1 % (ref 12.0–46.0)
Lymphs Abs: 2.9 10*3/uL (ref 0.7–4.0)
MCHC: 33.5 g/dL (ref 30.0–36.0)
MCV: 91.7 fl (ref 78.0–100.0)
Monocytes Absolute: 0.7 10*3/uL (ref 0.1–1.0)
Monocytes Relative: 7.8 % (ref 3.0–12.0)
Neutro Abs: 4.6 10*3/uL (ref 1.4–7.7)
Neutrophils Relative %: 55.4 % (ref 43.0–77.0)
Platelets: 307 10*3/uL (ref 150.0–400.0)
RBC: 4.87 Mil/uL (ref 3.87–5.11)
RDW: 13.5 % (ref 11.5–15.5)
WBC: 8.4 10*3/uL (ref 4.0–10.5)

## 2021-01-11 LAB — LIPID PANEL
Cholesterol: 162 mg/dL (ref 0–200)
HDL: 63.1 mg/dL (ref >39.00)
LDL Cholesterol: 83 mg/dL (ref 0–99)
NonHDL: 98.63
Total CHOL/HDL Ratio: 3
Triglycerides: 78 mg/dL (ref 0.0–149.0)
VLDL: 15.6 mg/dL (ref 0.0–40.0)

## 2021-01-11 LAB — TYPE AND SCREEN
ABO/RH(D): A POS
Antibody Screen: NEGATIVE

## 2021-01-11 LAB — VITAMIN D 25 HYDROXY (VIT D DEFICIENCY, FRACTURES): VITD: 40.96 ng/mL (ref 30.00–100.00)

## 2021-01-11 LAB — HCG, QUANTITATIVE, PREGNANCY: hCG, Beta Chain, Quant, S: 3604 m[IU]/mL — ABNORMAL HIGH

## 2021-01-11 LAB — TSH: TSH: 1.38 u[IU]/mL (ref 0.35–4.50)

## 2021-01-11 LAB — HEMOGLOBIN A1C: Hgb A1c MFr Bld: 5.2 % (ref 4.6–6.5)

## 2021-01-11 LAB — POCT URINE PREGNANCY: Preg Test, Ur: POSITIVE — AB

## 2021-01-11 MED ORDER — RHO D IMMUNE GLOBULIN 1500 UNIT/2ML IJ SOSY
300.0000 ug | PREFILLED_SYRINGE | Freq: Once | INTRAMUSCULAR | Status: DC
  Filled 2021-01-11: qty 2

## 2021-01-11 MED ORDER — PROAIR RESPICLICK 108 (90 BASE) MCG/ACT IN AEPB: 2.0000 | INHALATION_SPRAY | RESPIRATORY_TRACT | 1 refills | Status: DC | PRN

## 2021-01-11 MED ORDER — METHOTREXATE FOR ECTOPIC PREGNANCY
50.0000 mg/m2 | Freq: Once | INTRAMUSCULAR | Status: DC
  Administered 2021-01-11: 20:00:00 105 mg via INTRAMUSCULAR
  Filled 2021-01-11: qty 1

## 2021-01-11 MED ORDER — SACCHAROMYCES BOULARDII 250 MG PO CAPS: 250.0000 mg | ORAL_CAPSULE | Freq: Two times a day (BID) | ORAL | 1 refills | Status: DC

## 2021-01-11 MED ORDER — METHOTREXATE SODIUM CHEMO INJECTION 250 MG/10ML
50.0000 mg/m2 | Freq: Once | INTRAMUSCULAR | Status: DC
  Filled 2021-01-11: qty 4.1

## 2021-01-11 NOTE — Assessment & Plan Note (Signed)
CBE performed. No pelvic exam as pap smear is UTD and she is seeing GYN today.

## 2021-01-11 NOTE — Assessment & Plan Note (Signed)
She is off topamax without recurrence of ha. Will follow.

## 2021-01-11 NOTE — Telephone Encounter (Signed)
FYI was able to call GI & move appt up to 3/17 at 2:15p. I have called patient to let her know & she was okay with appointment change.

## 2021-01-11 NOTE — Progress Notes (Signed)
Patient ID: Mary Richards, female   DOB: 1986/03/07, 35 y.o.   MRN: [id number]  Reason for Consult: No chief complaint on file.   Referred by Mary Hawthorne, FNP  Subjective:     HPI:  Mary Richards is a 35 y.o. female. She was diagnosed with an ectopic pregnancy today on TVUS. She is otherwise feeling well. Her pain from yesterday has resolved. She has no new complaints.    Past Medical History:  Diagnosis Date  . Asthma   . Chronic migraine   . Complication of anesthesia   . DDD (degenerative disc disease), lumbosacral   . Family history of adverse reaction to anesthesia    Mother and Father - PONV  . PONV (postoperative nausea and vomiting)   . Wears contact lenses    Family History  Problem Relation Age of Onset  . Brain cancer Father 32  . Valvular heart disease Sister   . Brain cancer Paternal Grandmother 46  . COPD Maternal Grandfather   . Irritable bowel syndrome Mother   . Thyroid cancer Neg Hx   . Colon cancer Neg Hx    Past Surgical History:  Procedure Laterality Date  . CHOLECYSTECTOMY    . PILONIDAL CYST EXCISION    . TONSILECTOMY, ADENOIDECTOMY, BILATERAL MYRINGOTOMY AND TUBES    . TYMPANOPLASTY Right 05/10/2019   Procedure: TYMPANOPLASTY;  Surgeon: Beverly Gust, MD;  Location: [facility];  Service: ENT;  Laterality: Right;    Short Social History:  Social History   Tobacco Use  . Smoking status: Never Smoker  . Smokeless tobacco: Never Used  Substance Use Topics  . Alcohol use: Yes    Alcohol/week: 2.0 standard drinks    Types: 2 Standard drinks or equivalent per week    Comment: Social     Allergies  Allergen Reactions  . Azithromycin Hives, Shortness Of Breath and Swelling    Throat swelling  . Sulfa Antibiotics Hives  . Ciprofloxacin Swelling  . Moxifloxacin Hcl Hives  . Bactrim [Sulfamethoxazole-Trimethoprim] Rash    Current Outpatient Medications  Medication Sig Dispense Refill  . azelastine (ASTELIN) 0.1 % nasal  spray Place 1 spray into both nostrils 2 (two) times daily. Use in each nostril as directed 30 mL 0  . fexofenadine (ALLEGRA) 180 MG tablet Take 180 mg by mouth daily as needed.    . gabapentin (NEURONTIN) 100 MG capsule Take 2 capsules (200 mg total) by mouth at bedtime. 120 capsule 3  . Multiple Vitamin (MULTI-VITAMIN) tablet Take by mouth.    Marland Kitchen PROAIR RESPICLICK 275 (90 Base) MCG/ACT AEPB Inhale 2 puffs into the lungs as needed (every four to six hours for cough or wheeze). 1 each 1  . Probiotic Product (PROBIOTIC ADVANCED PO) Take 1 capsule by mouth daily.    Marland Kitchen saccharomyces boulardii (FLORASTOR) 250 MG capsule Take 1 capsule (250 mg total) by mouth 2 (two) times daily. 120 capsule 1  . traZODone (DESYREL) 50 MG tablet TAKE 1/2 TO 1 TABLET BY MOUTH AT BEDTIME AS NEEDED FOR SLEEP. 90 tablet 0  . VITAMIN D PO Take 5,000 mg by mouth daily.     No current facility-administered medications for this visit.    Review of Systems  Constitutional: Negative for chills, fatigue, fever and unexpected weight change.  HENT: Negative for trouble swallowing.  Eyes: Negative for loss of vision.  Respiratory: Negative for cough, shortness of breath and wheezing.  Cardiovascular: Negative for chest pain, leg swelling, palpitations and syncope.  GI:  Negative for abdominal pain, blood in stool, diarrhea, nausea and vomiting.  GU: Negative for difficulty urinating, dysuria, frequency and hematuria.  Musculoskeletal: Negative for back pain, leg pain and joint pain.  Skin: Negative for rash.  Neurological: Negative for dizziness, headaches, light-headedness, numbness and seizures.  Psychiatric: Negative for behavioral problem, confusion, depressed mood and sleep disturbance.        Objective:  Objective   There were no vitals filed for this visit. There is no height or weight on file to calculate BMI.  Physical Exam Vitals and nursing note reviewed.  Constitutional:      Appearance: She is  well-developed and well-nourished.  HENT:     Head: Normocephalic and atraumatic.  Eyes:     Extraocular Movements: EOM normal.     Pupils: Pupils are equal, round, and reactive to light.  Cardiovascular:     Rate and Rhythm: Normal rate and regular rhythm.  Pulmonary:     Effort: Pulmonary effort is normal. No respiratory distress.  Abdominal:     General: Abdomen is flat. There is no distension.     Palpations: Abdomen is soft. There is no mass.     Tenderness: There is no abdominal tenderness.  Skin:    General: Skin is warm and dry.  Neurological:     Mental Status: She is alert and oriented to person, place, and time.  Psychiatric:        Mood and Affect: Mood and affect normal.        Behavior: Behavior normal.        Thought Content: Thought content normal.        Judgment: Judgment normal.     Assessment/Plan:     35 yo G2P1011 with ectopic pregnancy on TVUS today   We discussed the diagnosis of ectopic pregnancy. We discussed her treatment options including methotrexate versus surgery. She would like to proceed with methotrexate therapy.   Methotrexate should not be used in patients with a intrauterine pregnancy, evidence of immunodeficiency, moderate to severe anemia leukopenia or thrombocytopenia, sensitivity methotrexate, active pulmonary disease, active peptic ulcer disease, clinically important hepatic dysfunction, clinically important renal dysfunction, breast-feeding, ruptured ectopic pregnancy, hemodynamically unstable patients, or patients who do not participate in follow-up.  She does not have these medical contraindications.   Relative contraindications to methotrexate include embryonic cardiac activity, high initial beta hCG concentration, ectopic pregnancy greater than 4 cm in size, and refusal to accept blood transfusion.  Methotrexate failure is 3.7% for pretreatment beta-hCG values less than 5000 mIU/mL.   Methotrexate failure is 14.3% for pretreatment  beta-hCG values greater than 5000 mIU/mL.   Overall treatment success of systemic methotrexate for ectopic pregnancy, which is defined as the resolution of the ectopic pregnancy without the need for surgery, ranges from 70 to 95%.  Resolution of hCG levels after medical management usually completes in 2 -4 weeks but can take up to 8 weeks.  Discussed with the patient to refrain from using aspirin, alcohol, NSAIDs, and folic acid supplements such as prenatal vitamins.    We discussed that ectopic pregnancies can be life threatening and surgical emergencies. The patient was instructed to follow up if she is experiencing worsening abdominal pain, heavy vaginal bleeding, fainting or dizziness. She was instructed to abstain for intercourse. Asked patient to remain close to a hospital and minimize her time alone until her ectopic pregnancy has resolved. Encouraged her to share this diagnosis with family so they can be away if she needs emergency  care.   Discussed that she may require surgical management for resolution of ectopic pregnancy if the methotrexate were to fail or if she was to have a acute rupture of her ectopic pregnancy.   We discussed the single dose regimen for methotrexate treatment protocols for ectopic pregnancies.  We discussed that the day that methotrexate is administered is considered day 1.  She will need to have follow-up measurements of beta-hCG levels on treatments day 4 and 7.  We would expect a decrease greater than 15% during this time.  And if not repeat dosing of methotrexate can be considered.  Patient understands that she will need to follow-up at the hospital in same-day surgery on  01/11/2021, 01/14/2021 and 01/17/2021 and plans were made for her to do so.   More than 30 minutes were spent face to face with the patient in the room, reviewing the medical record, labs and images, and coordinating care for the patient. The plan of management was discussed in detail and counseling  was provided.    Adrian Prows MD [facility], [facility] 01/11/2021 5:28 PM

## 2021-01-11 NOTE — Assessment & Plan Note (Signed)
Afebrile. Nontoxic in appearance. Discussed if left pelvic pain related to GI or GYN. She feels comfortable in seeing GYN this afternoon and I asked her to call back if she or GYN feels CT a/p appropriate as I can order. Will work on moving GI appointment up sooner.

## 2021-01-11 NOTE — Patient Instructions (Signed)
Ectopic Pregnancy  An ectopic pregnancy happens when a fertilized egg attaches (implants) outside the uterus. In a normal pregnancy, a fertilized egg implants in the uterus. An ectopic pregnancy cannot develop into a healthy baby. Most ectopic pregnancies occur in one of the fallopian tubes, which is where an egg travels from an ovary to get to the uterus. This is called a tubal pregnancy. An ectopic pregnancy can also happen on an ovary, on the cervix, or in the abdomen. When a fertilized egg implants on tissue outside the uterus and begins to grow, it may cause the tissue to tear or burst. This is known as a ruptured ectopic pregnancy. The tear or burst causes internal bleeding. This may cause intense pain in the abdomen. An ectopic pregnancy is a medical emergency and can be life-threatening. What are the causes? The most common cause of this condition is damage to one of the fallopian tubes. A fallopian tube may be narrowed or blocked, and that stops the fertilized egg from reaching the uterus. Sometimes, the cause of this condition is not known. What increases the risk? The following factors may make you more likely to develop this condition:  Having gone through infertility treatment before.  Having had an ectopic pregnancy before.  Having had surgery to have the fallopian tubes tied.  Becoming pregnant while using an intrauterine device for birth control.  Taking birth control pills before the age of 48. Other risk factors include:  Smoking.  Alcohol use.  History of DES exposure. DES is a medicine that was used until 1971 and affected babies whose mothers took the medicine. What are the signs or symptoms? Common symptoms of this condition include:  Missing a menstrual period.  Nausea or tiredness.  Tender breasts.  Other normal pregnancy symptoms. Other symptoms may include:  Pain during sex.  Vaginal bleeding or spotting.  Cramping or pain in the lower abdomen.  A  fast heartbeat, low blood pressure, and sweating.  Pain or increased pressure while having a bowel movement. Symptoms of a ruptured ectopic pregnancy and internal bleeding may include:  Sudden, severe pain in the abdomen.  Dizziness, weakness, feeling light-headed, or fainting.  Pain in the shoulder or neck area. How is this diagnosed? This condition is diagnosed by:  A blood test to check for the pregnancy hormone.  A pelvic exam to find painful areas or a mass in the abdomen.  Ultrasound. A probe is inserted into the vagina to see if there is a pregnancy in or outside the uterus.  Taking a sample of tissue from the uterus.  Surgery to look closely at the fallopian tubes through an incision in the abdomen. How is this treated? This condition is usually treated with medicine or surgery. Sometimes, ectopic pregnancies can resolve on their own, under close monitoring by your health care provider. Medicine A medicine called methotrexate may be given to cause the pregnancy tissue to be absorbed. The medicine may be given if:  The diagnosis is made early, with no signs of active bleeding.  The fallopian tube has not torn or burst. You will need blood tests to make sure the medicine is working. It may take 4-6 weeks for the pregnancy tissues to be absorbed. Surgery Surgery may be performed to:  Remove the pregnancy tissue.  Stop internal bleeding.  Remove part or all of the fallopian tube.  Remove the uterus. This is rare. After surgery, you may need to have blood tests to make sure the surgery worked.  Follow these instructions at home: Medicines  Take over-the-counter and prescription medicines only as told by your health care provider.  Ask your health care provider if the medicine prescribed to you: ? Requires you to avoid driving or using machinery. ? Can cause constipation. You may need to take these actions to prevent or treat constipation:  Drink enough fluid to  keep your urine pale yellow.  Take over-the-counter or prescription medicines.  Eat foods that are high in fiber, such as beans, whole grains, and fresh fruits and vegetables.  Limit foods that are high in fat and processed sugars, such as fried or sweet foods. General instructions  Rest or limit your activity, if told by your health care provider.  Do not have sex or put anything in your vagina, such as tampons or douches, for 6 weeks or until your health care provider says it is safe.  Do not lift anything that is heavier than 10 lb (4.5 kg), or the limit that you are told, until your health care provider says that it is safe.  Return to your normal activities as told by your health care provider. Ask your health care provider what activities are safe for you.  Keep all follow-up visits. This is important. Contact a health care provider if:  You have a fever or chills.  You have nausea and vomiting. Get help right away if:  Your pain gets worse or is not relieved by medicine.  You feel dizzy or weak.  You feel light-headed or you faint.  You have sudden, severe pain in your abdomen.  You have sudden pain in the shoulder or neck area. Summary  An ectopic pregnancy happens when a fertilized egg implants outside the uterus. Most ectopic pregnancies occur in one of the fallopian tubes.  An ectopic pregnancy is a medical emergency and can be life-threatening.  The most common cause of this condition is damage to one of the fallopian tubes.  This condition is usually treated with medicine or surgery. Some ectopic pregnancies resolve on their own, under close monitoring by your health care provider. This information is not intended to replace advice given to you by your health care provider. Make sure you discuss any questions you have with your health care provider. Document Revised: 02/11/2020 Document Reviewed: 02/11/2020 Elsevier Patient Education  [place].

## 2021-01-11 NOTE — Telephone Encounter (Signed)
Noted! Thank you

## 2021-01-11 NOTE — Telephone Encounter (Signed)
Spoke w/patient. Inquired about LMP. Pt states 12/19/20. She is due to start again next week. Patient reports previous cyst in 2011 or 2012. U/S showed cyst had already ruptured. Yesterday pain was intense, she used a heating pad and took 800mg  ibuprofen. Pain mimics previous cyst rupture. Today it is better. Bleeding/spotting brown/not bright red. Advised on heavy bleeding protocol and when to report to ED.

## 2021-01-11 NOTE — Telephone Encounter (Signed)
Patient r/s to [facility] at 11:30 today.

## 2021-01-11 NOTE — Discharge Instructions (Signed)
Following Methotrexate Administration for Ectopic Pregnancy  Your physician will obtain follow-up blood work to monitor the effect of the medication.   After receiving methotrexate avoid:  Alcoholic beverages  Vitamins containing folic acid Foods that contain folic acid, including fortified cereal, enriched bread and pasta, peanuts, dark green leafy vegetables, orange juice, and beans  Gas-forming foods  Nonsteroidal antiinflammatory painkillers  Sexual intercourse or any strenuous activity because it may cause the fallopian tube to rupture Do not become pregnant for 3 months to decrease the risk of birth defects.  You may experience side effects, like nausea, vomiting, dizziness, and mouth and lip ulcers.  Most women have abdominal pain a couple of days after the injection.  Notify your obstetric practitioner or return to the emergency department if you develop severe abdominal pain, dizziness or fainting, heavy vaginal bleeding, severe nausea and vomiting, or fever.  Double-flush the toilet with the lid closed for 72 hours after receiving the injection.

## 2021-01-11 NOTE — Telephone Encounter (Addendum)
Spoke w/Kate, ok to add for eval. Will need u/s (not available here today). Pt aware. Apt scheduled for 1:30 today. She is currently at PCP for physical. Will discuss w/PCP. May cancel.

## 2021-01-11 NOTE — Telephone Encounter (Signed)
Patient having pain in left side/ovary. Woke up this morning w/light spotting. Unsure if she had a cyst. Usually doesn't have trouble with this. [phone]

## 2021-01-11 NOTE — Patient Instructions (Signed)
Methotrexate Treatment for an Ectopic Pregnancy Methotrexate is a medicine that treats an ectopic pregnancy. In this type of pregnancy, the fertilized egg attaches (implants) outside the uterus. An ectopic pregnancy cannot develop into a healthy baby. Methotrexate works by stopping the growth of the fertilized egg. It also helps the body absorb tissue from the egg. This takes about 2-6 weeks. An ectopic pregnancy can be life-threatening. However, most ectopic pregnancies can be successfully treated with methotrexate if they are diagnosed early. Tell a health care provider about:  Any allergies you have.  All medicines you are taking, including vitamins, herbs, eye drops, creams, and over-the-counter medicines.  Any medical conditions you have. What are the risks? Generally, this is a safe treatment. However, problems may occur, including:  Digestive problems. You may have: ? Nausea. ? Vomiting. ? Diarrhea. ? Cramping in your abdomen.  Bleeding or spotting from your vagina.  Feeling dizzy or light-headed.  Mouth sores.  Inflammation of the lining of your lungs (pneumonitis).  Damage to nearby structures or organs, such as damage to the liver.  Hair loss. There is a risk that methotrexate treatment will fail and the pregnancy will continue. There is also a risk that the ectopic pregnancy might tear or burst (rupture) during use of this medicine. What happens before the procedure?  Blood tests will be done to check how your disease-fighting system (immune system), liver, and kidneys are working.  You will also have blood tests to measure your pregnancy hormone levels and to find out your blood type.  You will be given a shot of a medicine called Rho(D) immune globulin if: ? You are Rh-negative and the father is Rh-positive. ? You are Rh-negative and the father's Rh type is unknown. What happens during the procedure?  Methotrexate will be injected into your  muscle. ? Methotrexate may be given as a single dose of medicine or a series of doses over time, depending on your response to the treatment. ? Methotrexate injections are given by a health care provider. Injection is the most common way that this medicine is used to treat an ectopic pregnancy.  You may also receive other medicines to manage your ectopic pregnancy. The procedure may vary among health care providers and hospitals. What can I expect after treatment? After your treatment, it is common to have:  Cramping in your abdomen.  Bleeding in your vagina.  Tiredness (fatigue).  Nausea.  Vomiting.  Diarrhea. Blood tests will be done at timed intervals for several days or weeks to check your pregnancy hormone levels. The blood tests will be done until the pregnancy hormone can no longer be found in the blood. If the methotrexate treatment does not work, a surgical procedure may be done to remove the ectopic pregnancy. Follow these instructions at home: Medicines  Take over-the-counter and prescription medicines only as told by your health care provider.  Do not take prescription pain medicines, aspirin, ibuprofen, naproxen, or any other NSAIDs.  Do not take folic acid, prenatal vitamins, or other vitamins that contain folic acid. Activity  Do not have sex, douche, or put anything, such as tampons, in your vagina until your health care provider says it is okay.  Limit activities that take a lot of effort as told by your health care provider. General instructions  Do not drink alcohol.  Follow instructions from your health care provider about eating restrictions, such as avoiding foods that produce a lot of gas. These foods can hide the signs of a   ruptured ectopic pregnancy.  Limit exposure to sunlight or artificial UV light such as from tanning beds. Methotrexate can make you more sensitive to the sun.  Follow instructions from your health care provider on how and when to  report any symptoms that may indicate a ruptured ectopic pregnancy.  Keep all follow-up visits. This is important.   Contact a health care provider if:  You have persistent nausea and vomiting.  You have persistent diarrhea.  You are having a reaction to the medicine. This may include: ? Unusual fatigue. ? Skin rash. Get help right away if:  Pain in your abdomen or in the area between your hip bones (pelvic area) gets worse.  You have more bleeding from your vagina.  You feel light-headed or you faint.  You are short of breath.  Your heart rate increases.  You develop a cough.  You have chills or a fever. Summary  Methotrexate is a medicine that treats an ectopic pregnancy. This type of pregnancy forms outside the uterus.  There is a risk that methotrexate treatment will fail and the pregnancy will continue. There is also a risk that the ectopic pregnancy might tear or burst during use of this medicine.  This medicine may be given in a single dose or a series of doses over time.  After your treatment, blood tests will be done at timed intervals for several days or weeks to check your pregnancy hormone levels. The blood tests will be done until no more pregnancy hormone is found in the blood. This information is not intended to replace advice given to you by your health care provider. Make sure you discuss any questions you have with your health care provider. Document Revised: 04/15/2020 Document Reviewed: 04/15/2020 Elsevier Patient Education  [place]. Methotrexate injection What is this medicine? METHOTREXATE (METH oh TREX ate) is a chemotherapy medicine. It treats certain types of cancer. Some of the cancers treated are breast cancer, head and neck cancer, leukemia, lymphoma, and osteosarcoma. This medicine can also be used to treat psoriasis and certain kinds of arthritis. This medicine may be used for other purposes; ask your health care provider or  pharmacist if you have questions. What should I tell my health care provider before I take this medicine? They need to know if you have any of these conditions:  fluid in the stomach area or lungs  if you often drink alcohol  infection or immune system problems  kidney disease  liver disease  low blood counts (white cells, platelets, or red blood cells)  lung disease  recent or ongoing radiation  recent or upcoming vaccine  stomach ulcers  ulcerative colitis  an unusual or allergic reaction to methotrexate, other medicines, foods, dyes, or preservatives  pregnant or trying to get pregnant  breast-feeding How should I use this medicine? This medicine is for infusion into a vein or for injection into muscle or into the spinal fluid (whichever applies). It is usually given by a health care professional in a hospital or clinic setting. In rare cases, you might get this medicine at home. You will be taught how to give this medicine. Use exactly as directed. Take your medicine at regular intervals. Do not take your medicine more often than directed. If this medicine is used for arthritis or psoriasis, it should be taken weekly, NOT daily. It is important that you put your used needles and syringes in a special sharps container. Do not put them in a trash can. If  you do not have a sharps container, call your pharmacist or healthcare provider to get one. Talk to your pediatrician regarding the use of this medicine in children. While this drug may be prescribed for children as young as 2 years for selected conditions, precautions do apply. Overdosage: If you think you have taken too much of this medicine contact a poison control center or emergency room at once. NOTE: This medicine is only for you. Do not share this medicine with others. What if I miss a dose? It is important not to miss your dose. Call your doctor or health care professional if you are unable to keep an appointment.  If you give yourself the medicine and you miss a dose, talk with your doctor or health care professional. Do not take double or extra doses. What may interact with this medicine? Do not take this medicine with any of the following medications:  acitretin This medicine may also interact with the following medications:  aspirin or aspirin-like medicines including salicylates  azathioprine  certain antibiotics like chloramphenicol, penicillin, tetracycline  certain medicines that treat or prevent blood clots like warfarin, apixaban, dabigatran, and rivaroxaban  certain medicines for stomach problems like esomeprazole, omeprazole, pantoprazole  cyclosporine  dapsone  diuretics  folic acid  gold  hydroxychloroquine  live virus vaccines  medicines for infection like acyclovir, adefovir, amphotericin B, bacitracin, cidofovir, foscarnet, ganciclovir, gentamicin, pentamidine, vancomycin  mercaptopurine  NSAIDs, medicines for pain and inflammation, like ibuprofen or naproxen  pamidronate  pemetrexed  penicillamine  phenylbutazone  phenytoin  probenacid  pyrimethamine  retinoids such as isotretinoin and tretinoin  steroid medicines like prednisone or cortisone  sulfonamides like sulfasalazine and trimethoprim/sulfamethoxazole  theophylline  zoledronic acid This list may not describe all possible interactions. Give your health care provider a list of all the medicines, herbs, non-prescription drugs, or dietary supplements you use. Also tell them if you smoke, drink alcohol, or use illegal drugs. Some items may interact with your medicine. What should I watch for while using this medicine? This medicine may make you feel generally unwell. This is not uncommon as chemotherapy can affect healthy cells as well as cancer cells. Report any side effects. Continue your course of treatment even though you feel ill unless your health care provider tells you to stop. Your  condition will be monitored carefully while you are receiving this medicine. Avoid alcoholic drinks. This medicine can cause serious side effects. To reduce the risk, your health care provider may give you other medicines to take before receiving this one. Be sure to follow the directions from your health care provider. This medicine can make you more sensitive to the sun. Keep out of the sun. If you cannot avoid being in the sun, wear protective clothing and use sunscreen. Do not use sun lamps or tanning beds/booths. You may get drowsy or dizzy. Do not drive, use machinery, or do anything that needs mental alertness until you know how this medicine affects you. Do not stand or sit up quickly, especially if you are an older patient. This reduces the risk of dizzy or fainting spells. You may need blood work while you are taking this medicine. Call your doctor or health care professional for advice if you get a fever, chills or sore throat, or other symptoms of a cold or flu. Do not treat yourself. This drug decreases your body's ability to fight infections. Try to avoid being around people who are sick. This medicine may increase your risk to bruise  or bleed. Call your doctor or health care professional if you notice any unusual bleeding. Be careful brushing or flossing your teeth or using a toothpick because you may get an infection or bleed more easily. If you have any dental work done, tell your dentist you are receiving this medicine Check with your doctor or health care professional if you get an attack of severe diarrhea, nausea and vomiting, or if you sweat a lot. The loss of too much body fluid can make it dangerous for you to take this medicine. Talk to your doctor about your risk of cancer. You may be more at risk for certain types of cancers if you take this medicine. Do not become pregnant while taking this medicine or for 6 months after stopping it. Women should inform their health care  provider if they wish to become pregnant or think they might be pregnant. Men should not father a child while taking this medicine and for 3 months after stopping it. There is potential for serious harm to an unborn child. Talk to your health care provider for more information. Do not breast-feed an infant while taking this medicine or for 1 week after stopping it. This medicine may make it more difficult to get pregnant or father a child. Talk to your health care provider if you are concerned about your fertility. What side effects may I notice from receiving this medicine? Side effects that you should report to your doctor or health care professional as soon as possible:  allergic reactions like skin rash, itching or hives, swelling of the face, lips, or tongue  back pain  breathing problems or shortness of breath  confusion  diarrhea  dry, nonproductive cough  low blood counts - this medicine may decrease the number of white blood cells, red blood cells and platelets. You may be at increased risk of infections and bleeding  mouth sores  redness, blistering, peeling or loosening of the skin, including inside the mouth  seizures  severe headaches  signs of infection - fever or chills, cough, sore throat, pain or difficulty passing urine  signs and symptoms of bleeding such as bloody or black, tarry stools; red or dark-brown urine; spitting up blood or brown material that looks like coffee grounds; red spots on the skin; unusual bruising or bleeding from the eye, gums, or nose  signs and symptoms of kidney injury like trouble passing urine or change in the amount of urine  signs and symptoms of liver injury like dark yellow or brown urine; general ill feeling or flu-like symptoms; light-colored stools; loss of appetite; nausea; right upper belly pain; unusually weak or tired; yellowing of the eyes or skin  stiff neck  vomiting Side effects that usually do not require medical  attention (report to your doctor or health care professional if they continue or are bothersome):  dizziness  hair loss  headache  stomach pain  upset stomach This list may not describe all possible side effects. Call your doctor for medical advice about side effects. You may report side effects to FDA at 1-800-FDA-1088. Where should I keep my medicine? This medicine is given in a hospital or clinic. It will not be stored at home. NOTE: This sheet is a summary. It may not cover all possible information. If you have questions about this medicine, talk to your doctor, pharmacist, or health care provider.  2021 Elsevier/[employer] Standard (2020-03-17 10:19:36)

## 2021-01-11 NOTE — Progress Notes (Signed)
Patient ID: Mary Richards, female   DOB: [birth year], 35 y.o.   MRN: [id number]  Reason for Consult: Gynecologic Exam   Referred by Burnard Hawthorne, FNP  Subjective:     HPI:  Mary Richards is a 35 y.o. female.  She presents today with complaints of left-sided pain.  She reports that the pain started yesterday around 3:00.  She felt it in her left side.  She reports that it got worse in the evening and she had trouble sitting up.  The pain was severe enough that she felt nauseous.  She took Motrin and use a heating pad.  This improved the pain with time but she felt like some pain was radiating downward in a shooting pain almost in her vagina.  She notes that today she began having vaginal spotting.  Urine pregnancy test today in the office is positive. Patient reports that she is not currently using anything to prevent pregnancy  Gynecological History Menarche: 14 LMP: December 19, 2020. Describes periods as regular monthly generally 29 days between periods with 6 days of bleeding. Last pap smear: 2020 NIL Sexually Active: yes  Obstetrical History OB History    Gravida  1   Para  1   Term  1   Preterm      AB      Living  1     SAB      IAB      Ectopic      Multiple      Live Births  1            Past Medical History:  Diagnosis Date  . Asthma   . Chronic migraine   . Complication of anesthesia   . DDD (degenerative disc disease), lumbosacral   . Family history of adverse reaction to anesthesia    Mother and Father - PONV  . PONV (postoperative nausea and vomiting)   . Wears contact lenses    Family History  Problem Relation Age of Onset  . Brain cancer Father 41  . Valvular heart disease Sister   . Brain cancer Paternal Grandmother 33  . COPD Maternal Grandfather   . Irritable bowel syndrome Mother   . Thyroid cancer Neg Hx   . Colon cancer Neg Hx    Past Surgical History:  Procedure Laterality Date  . CHOLECYSTECTOMY    . PILONIDAL  CYST EXCISION    . TONSILECTOMY, ADENOIDECTOMY, BILATERAL MYRINGOTOMY AND TUBES    . TYMPANOPLASTY Right 05/10/2019   Procedure: TYMPANOPLASTY;  Surgeon: Beverly Gust, MD;  Location: [facility];  Service: ENT;  Laterality: Right;    Short Social History:  Social History   Tobacco Use  . Smoking status: Never Smoker  . Smokeless tobacco: Never Used  Substance Use Topics  . Alcohol use: Yes    Alcohol/week: 2.0 standard drinks    Types: 2 Standard drinks or equivalent per week    Comment: Social     Allergies  Allergen Reactions  . Azithromycin Hives, Shortness Of Breath and Swelling    Throat swelling  . Sulfa Antibiotics Hives  . Ciprofloxacin Swelling  . Moxifloxacin Hcl Hives  . Bactrim [Sulfamethoxazole-Trimethoprim] Rash    Current Outpatient Medications  Medication Sig Dispense Refill  . azelastine (ASTELIN) 0.1 % nasal spray Place 1 spray into both nostrils 2 (two) times daily. Use in each nostril as directed 30 mL 0  . fexofenadine (ALLEGRA) 180 MG tablet Take 180 mg by mouth daily as  needed.    . gabapentin (NEURONTIN) 100 MG capsule Take 2 capsules (200 mg total) by mouth at bedtime. 120 capsule 3  . Multiple Vitamin (MULTI-VITAMIN) tablet Take by mouth.    Marland Kitchen PROAIR RESPICLICK 793 (90 Base) MCG/ACT AEPB Inhale 2 puffs into the lungs as needed (every four to six hours for cough or wheeze). 1 each 1  . Probiotic Product (PROBIOTIC ADVANCED PO) Take 1 capsule by mouth daily.    Marland Kitchen saccharomyces boulardii (FLORASTOR) 250 MG capsule Take 1 capsule (250 mg total) by mouth 2 (two) times daily. 120 capsule 1  . traZODone (DESYREL) 50 MG tablet TAKE 1/2 TO 1 TABLET BY MOUTH AT BEDTIME AS NEEDED FOR SLEEP. 90 tablet 0  . VITAMIN D PO Take 5,000 mg by mouth daily.     No current facility-administered medications for this visit.    Review of Systems  Constitutional: Negative for chills, fatigue, fever and unexpected weight change.  HENT: Negative for trouble  swallowing.  Eyes: Negative for loss of vision.  Respiratory: Negative for cough, shortness of breath and wheezing.  Cardiovascular: Negative for chest pain, leg swelling, palpitations and syncope.  GI: Positive for abdominal pain. Negative for blood in stool, diarrhea, nausea and vomiting.  GU: Negative for difficulty urinating, dysuria, frequency and hematuria.  Musculoskeletal: Negative for back pain, leg pain and joint pain.  Skin: Negative for rash.  Neurological: Negative for dizziness, headaches, light-headedness, numbness and seizures.  Psychiatric: Negative for behavioral problem, confusion, depressed mood and sleep disturbance.        Objective:  Objective   Vitals:   01/11/21 1133  BP: 120/72  Weight: 196 lb 6.4 oz (89.1 kg)  Height: 5\' 7"  (1.702 m)   Body mass index is 30.76 kg/m.  Physical Exam Vitals and nursing note reviewed.  Constitutional:      Appearance: She is well-developed and well-nourished.  HENT:     Head: Normocephalic and atraumatic.  Eyes:     Extraocular Movements: EOM normal.     Pupils: Pupils are equal, round, and reactive to light.  Cardiovascular:     Rate and Rhythm: Normal rate and regular rhythm.  Pulmonary:     Effort: Pulmonary effort is normal. No respiratory distress.  Abdominal:     General: Abdomen is flat. There is no distension.     Palpations: Abdomen is soft. There is no mass.     Tenderness: There is no abdominal tenderness. There is no guarding or rebound.  Genitourinary:    Comments: External: Normal appearing vulva. No lesions noted.  Bimanual examination: Uterus midline, non-tender, normal in size, shape and contour.  No CMT. No adnexal masses. No adnexal tenderness. Pelvis not fixed. Scant brown blood on glove.   Skin:    General: Skin is warm and dry.  Neurological:     Mental Status: She is alert and oriented to person, place, and time.  Psychiatric:        Mood and Affect: Mood and affect normal.         Behavior: Behavior normal.        Thought Content: Thought content normal.        Judgment: Judgment normal.      Bedside ultrasound today in clinic did not show an intrauterine pregnancy.  Unable to use transvaginal probe so uterus with and ovaries were not clearly visualized.  Assessment/Plan:    35 year old with early stage pregnancy of unknown location and left sided pelvic pain Will obtain beta-hCG  quant today and have patient follow-up for stat ultrasound at the hospital. Discussed ectopic pregnancy precautions given that this is a early stage pregnancy ectopic cannot be excluded.  Patient will plan for follow-up in 2 days in office and will likely need repeat lab testing at that time for beta-hCG. Discussed ectopic precautions and that patient should proceed to the emergency room for any severe abdominal pain,  dizziness,  lightheadedness, or heavy vaginal bleeding.  More than 40 minutes were spent face to face with the patient in the room, reviewing the medical record, labs and images, and coordinating care for the patient. The plan of management was discussed in detail and counseling was provided.      Adrian Prows MD [facility], [facility] 01/11/2021 12:21 PM

## 2021-01-11 NOTE — Progress Notes (Signed)
Subjective:    Patient ID: Mary Richards, female    DOB: [birth year], 35 y.o.   MRN: [id number]  CC: Mary Richards is a 35 y.o. female who presents today for physical exam and follow up  HPI: She noticed difference of blood pressure in arms.  At home reports Right arm 120/74, and then left arm 148/98. This has occurred several occassions.  No numbness, neck pain, HA.  Loose stools have improved. Loose stools occur after meals.  Stool is not watery diarrhea.  Will have formed BM if she has had loose stools prior.  No fever. Some days no diarrhea at all. . Certain food triggers including greasy food, greens. Continues probiotic. She has eliminated dairy without relief. FOD MAP diet without improvement. No increased stress. Denies anxiety.  She hasnt seen any blood in stool. No straining with BM.  H/o hemorrhoids in pregnancy.   She is no longer on topamax as concerned it was causing diarrhea. No HA since off medication.   Chronic low back pain- compliant with gabapentin 200mg  qhs with relief.   She had left low pelvic pain yesterday. Severe in onset. Improved with ibuprofen 800mg  and pain resolved in 4 hours. She has menstrual blood today. Feels 'pain in uterus' and describes as soreness.  No fever, dysuria. She is not on birth control. No h/o diverticulitis.   Appointment with Dr Vicente Males 02/18/21.    Colorectal Cancer Screening: no early family history Breast Cancer Screening:  no early family history Cervical Cancer Screening: UTD, follows with GYN and has appt with them today           Tetanus - utd       Hepatitis C screening - Candidate for, consents  Labs: Screening labs today. Alcohol use:  occassional Smoking/tobacco use: Nonsmoker.     HISTORY:  Past Medical History:  Diagnosis Date  . Asthma   . Chronic migraine   . Complication of anesthesia   . DDD (degenerative disc disease), lumbosacral   . Family history of adverse reaction to anesthesia    Mother and Father -  PONV  . PONV (postoperative nausea and vomiting)   . Wears contact lenses     Past Surgical History:  Procedure Laterality Date  . CHOLECYSTECTOMY    . PILONIDAL CYST EXCISION    . TONSILECTOMY, ADENOIDECTOMY, BILATERAL MYRINGOTOMY AND TUBES    . TYMPANOPLASTY Right 05/10/2019   Procedure: TYMPANOPLASTY;  Surgeon: Beverly Gust, MD;  Location: [facility];  Service: ENT;  Laterality: Right;   Family History  Problem Relation Age of Onset  . Brain cancer Father 22  . Valvular heart disease Sister   . Brain cancer Paternal Grandmother 20  . COPD Maternal Grandfather   . Irritable bowel syndrome Mother   . Thyroid cancer Neg Hx   . Colon cancer Neg Hx       ALLERGIES: Azithromycin, Sulfa antibiotics, Ciprofloxacin, Moxifloxacin hcl, and Bactrim [sulfamethoxazole-trimethoprim]  Current Outpatient Medications on File Prior to Visit  Medication Sig Dispense Refill  . azelastine (ASTELIN) 0.1 % nasal spray Place 1 spray into both nostrils 2 (two) times daily. Use in each nostril as directed 30 mL 0  . fexofenadine (ALLEGRA) 180 MG tablet Take 180 mg by mouth daily as needed.    . gabapentin (NEURONTIN) 100 MG capsule Take 2 capsules (200 mg total) by mouth at bedtime. 120 capsule 3  . Multiple Vitamin (MULTI-VITAMIN) tablet Take by mouth.    . Probiotic Product (PROBIOTIC ADVANCED PO)  Take 1 capsule by mouth daily.    . traZODone (DESYREL) 50 MG tablet TAKE 1/2 TO 1 TABLET BY MOUTH AT BEDTIME AS NEEDED FOR SLEEP. 90 tablet 0  . VITAMIN D PO Take 5,000 mg by mouth daily.     No current facility-administered medications on file prior to visit.    Social History   Tobacco Use  . Smoking status: Never Smoker  . Smokeless tobacco: Never Used  Vaping Use  . Vaping Use: Never used  Substance Use Topics  . Alcohol use: Yes    Alcohol/week: 2.0 standard drinks    Types: 2 Standard drinks or equivalent per week    Comment: Social   . Drug use: Never    Review of Systems   Constitutional: Negative for chills, fever and unexpected weight change.  HENT: Negative for congestion.   Respiratory: Negative for cough and shortness of breath.   Cardiovascular: Negative for chest pain, palpitations and leg swelling.  Gastrointestinal: Positive for abdominal pain and diarrhea. Negative for abdominal distention, anal bleeding, blood in stool, constipation, nausea and vomiting.  Genitourinary: Positive for menstrual problem, pelvic pain and vaginal pain. Negative for difficulty urinating and dyspareunia.  Musculoskeletal: Negative for arthralgias and myalgias.  Skin: Negative for rash.  Neurological: Negative for headaches.  Hematological: Negative for adenopathy.  Psychiatric/Behavioral: Negative for confusion.      Objective:    BP 98/62   Pulse 92   Temp 98.2 F (36.8 C)   Ht 5' 7.25" (1.708 m)   Wt 187 lb 9.6 oz (85.1 kg)   SpO2 98%   BMI 29.16 kg/m   BP Readings from Last 3 Encounters:  01/11/21 98/62  12/30/20 110/68  06/05/20 128/80   Wt Readings from Last 3 Encounters:  01/11/21 187 lb 9.6 oz (85.1 kg)  12/30/20 193 lb (87.5 kg)  06/05/20 191 lb (86.6 kg)    Physical Exam Vitals reviewed.  Constitutional:      Appearance: Normal appearance. She is well-developed and well-nourished.  Eyes:     Conjunctiva/sclera: Conjunctivae normal.  Neck:     Thyroid: No thyroid mass or thyromegaly.  Cardiovascular:     Rate and Rhythm: Normal rate and regular rhythm.     Pulses: Normal pulses.     Heart sounds: Normal heart sounds.  Pulmonary:     Effort: Pulmonary effort is normal.     Breath sounds: Normal breath sounds. No wheezing, rhonchi or rales.     Comments: CBE performed.  Chest:  Breasts: Breasts are symmetrical.     Right: No inverted nipple, mass, nipple discharge, skin change or tenderness.     Left: No inverted nipple, mass, nipple discharge, skin change or tenderness.    Abdominal:     General: Bowel sounds are normal. There is  no distension or ascites.     Palpations: Abdomen is soft. Abdomen is not rigid. There is no fluid wave or mass.     Tenderness: There is abdominal tenderness in the left lower quadrant. There is no CVA tenderness, guarding or rebound.     Comments: LLQ tenderness on with deep palpation on exam. No rebound or guarding.   Lymphadenopathy:     Head:     Right side of head: No submental, submandibular, tonsillar, preauricular, posterior auricular or occipital adenopathy.     Left side of head: No submental, submandibular, tonsillar, preauricular, posterior auricular or occipital adenopathy.     Cervical: No cervical adenopathy.     Right  cervical: No superficial, deep or posterior cervical adenopathy.    Left cervical: No superficial, deep or posterior cervical adenopathy.     Upper Body:  No axillary adenopathy present. Skin:    General: Skin is warm and dry.  Neurological:     Mental Status: She is alert.  Psychiatric:        Mood and Affect: Mood and affect normal.        Speech: Speech normal.        Behavior: Behavior normal.        Thought Content: Thought content normal.        Assessment & Plan:   Problem List Items Addressed This Visit      Cardiovascular and Mediastinum   Migraine    She is off topamax without recurrence of ha. Will follow.        Other   Chronic low back pain    Chronic. Controlled with gabapentin 200mg  qhs.       Diarrhea    Afebrile. Nontoxic in appearance. Discussed if left pelvic pain related to GI or GYN. She feels comfortable in seeing GYN this afternoon and I asked her to call back if she or GYN feels CT a/p appropriate as I can order. Will work on moving GI appointment up sooner.       Encounter for examination of blood pressure with abnormal findings   Relevant Orders   Ambulatory referral to Vascular Surgery   Routine physical examination - Primary    CBE performed. No pelvic exam as pap smear is UTD and she is seeing GYN today.       Relevant Orders   TSH   CBC with Differential/Platelet   Comprehensive metabolic panel   Hemoglobin A1c   Hepatitis C antibody   Lipid panel   VITAMIN D 25 Hydroxy (Vit-D Deficiency, Fractures)   Ambulatory referral to Vascular Surgery       I have discontinued Yarnell Westrup's topiramate. I am also having her start on saccharomyces boulardii. Additionally, I am having her maintain her azelastine, Multi-Vitamin, VITAMIN D PO, fexofenadine, traZODone, gabapentin, and Probiotic Product (PROBIOTIC ADVANCED PO).   Meds ordered this encounter  Medications  . saccharomyces boulardii (FLORASTOR) 250 MG capsule    Sig: Take 1 capsule (250 mg total) by mouth 2 (two) times daily.    Dispense:  120 capsule    Refill:  1    Order Specific Question:   Supervising Provider    Answer:   Crecencio Mc [[id number]]    Return precautions given.   Risks, benefits, and alternatives of the medications and treatment plan prescribed today were discussed, and patient expressed understanding.   Education regarding symptom management and diagnosis given to patient on AVS.   Continue to follow with Burnard Hawthorne, FNP for routine health maintenance.   [name] and I agreed with plan.   Mable Paris, FNP

## 2021-01-11 NOTE — Assessment & Plan Note (Signed)
Chronic. Controlled with gabapentin 200mg  qhs.

## 2021-01-11 NOTE — Telephone Encounter (Signed)
[facility] - [phone]  Pt has upcoming appt with GI in April Dr Vicente Males.   However she is still having very frequent loose stools. Occult blood is positive  Do they have any earlier appts?

## 2021-01-11 NOTE — Telephone Encounter (Signed)
noted 

## 2021-01-11 NOTE — Patient Instructions (Signed)
Start Florastor probiotic.  Please discuss with GYN today whether pelvic pain is related to GI. I can order CT abdomen and pelvis if you feel necessary. Please let me know right away and I can order stat.    Referral to vascular Let us know if you dont hear back within a week in regards to an appointment being scheduled.    Health Maintenance, Female Adopting a healthy lifestyle and getting preventive care are important in promoting health and wellness. Ask your health care provider about:  The right schedule for you to have regular tests and exams.  Things you can do on your own to prevent diseases and keep yourself healthy. What should I know about diet, weight, and exercise? Eat a healthy diet  Eat a diet that includes plenty of vegetables, fruits, low-fat dairy products, and lean protein.  Do not eat a lot of foods that are high in solid fats, added sugars, or sodium.   Maintain a healthy weight Body mass index (BMI) is used to identify weight problems. It estimates body fat based on height and weight. Your health care provider can help determine your BMI and help you achieve or maintain a healthy weight. Get regular exercise Get regular exercise. This is one of the most important things you can do for your health. Most adults should:  Exercise for at least 150 minutes each week. The exercise should increase your heart rate and make you sweat (moderate-intensity exercise).  Do strengthening exercises at least twice a week. This is in addition to the moderate-intensity exercise.  Spend less time sitting. Even light physical activity can be beneficial. Watch cholesterol and blood lipids Have your blood tested for lipids and cholesterol at 34 years of age, then have this test every 5 years. Have your cholesterol levels checked more often if:  Your lipid or cholesterol levels are high.  You are older than 34 years of age.  You are at high risk for heart disease. What should I  know about cancer screening? Depending on your health history and family history, you may need to have cancer screening at various ages. This may include screening for:  Breast cancer.  Cervical cancer.  Colorectal cancer.  Skin cancer.  Lung cancer. What should I know about heart disease, diabetes, and high blood pressure? Blood pressure and heart disease  High blood pressure causes heart disease and increases the risk of stroke. This is more likely to develop in people who have high blood pressure readings, are of African descent, or are overweight.  Have your blood pressure checked: ? Every 3-5 years if you are 31-39 years of age. ? Every year if you are 25 years old or older. Diabetes Have regular diabetes screenings. This checks your fasting blood sugar level. Have the screening done:  Once every three years after age 74 if you are at a normal weight and have a low risk for diabetes.  More often and at a younger age if you are overweight or have a high risk for diabetes. What should I know about preventing infection? Hepatitis B If you have a higher risk for hepatitis B, you should be screened for this virus. Talk with your health care provider to find out if you are at risk for hepatitis B infection. Hepatitis C Testing is recommended for:  Everyone born from [birth year] through 1964.  Anyone with known risk factors for hepatitis C. Sexually transmitted infections (STIs)  Get screened for STIs, including gonorrhea and chlamydia, if: ?  You are sexually active and are younger than 34 years of age. ? You are older than 34 years of age and your health care provider tells you that you are at risk for this type of infection. ? Your sexual activity has changed since you were last screened, and you are at increased risk for chlamydia or gonorrhea. Ask your health care provider if you are at risk.  Ask your health care provider about whether you are at high risk for HIV. Your health  care provider may recommend a prescription medicine to help prevent HIV infection. If you choose to take medicine to prevent HIV, you should first get tested for HIV. You should then be tested every 3 months for as long as you are taking the medicine. Pregnancy  If you are about to stop having your period (premenopausal) and you may become pregnant, seek counseling before you get pregnant.  Take 400 to 800 micrograms (mcg) of folic acid every day if you become pregnant.  Ask for birth control (contraception) if you want to prevent pregnancy. Osteoporosis and menopause Osteoporosis is a disease in which the bones lose minerals and strength with aging. This can result in bone fractures. If you are 46 years old or older, or if you are at risk for osteoporosis and fractures, ask your health care provider if you should:  Be screened for bone loss.  Take a calcium or vitamin D supplement to lower your risk of fractures.  Be given hormone replacement therapy (HRT) to treat symptoms of menopause. Follow these instructions at home: Lifestyle  Do not use any products that contain nicotine or tobacco, such as cigarettes, e-cigarettes, and chewing tobacco. If you need help quitting, ask your health care provider.  Do not use street drugs.  Do not share needles.  Ask your health care provider for help if you need support or information about quitting drugs. Alcohol use  Do not drink alcohol if: ? Your health care provider tells you not to drink. ? You are pregnant, may be pregnant, or are planning to become pregnant.  If you drink alcohol: ? Limit how much you use to 0-1 drink a day. ? Limit intake if you are breastfeeding.  Be aware of how much alcohol is in your drink. In the [place] one drink equals one 12 oz bottle of beer (355 mL), one 5 oz glass of wine (148 mL), or one 1 oz glass of hard liquor (44 mL). General instructions  Schedule regular health, dental, and eye exams.  Stay  current with your vaccines.  Tell your health care provider if: ? You often feel depressed. ? You have ever been abused or do not feel safe at home. Summary  Adopting a healthy lifestyle and getting preventive care are important in promoting health and wellness.  Follow your health care provider's instructions about healthy diet, exercising, and getting tested or screened for diseases.  Follow your health care provider's instructions on monitoring your cholesterol and blood pressure. This information is not intended to replace advice given to you by your health care provider. Make sure you discuss any questions you have with your health care provider. Document Revised: 10/24/2018 Document Reviewed: 10/24/2018 Elsevier Patient Education  2021 [employer].

## 2021-01-12 ENCOUNTER — Other Ambulatory Visit: Payer: Self-pay | Admitting: Family

## 2021-01-12 ENCOUNTER — Telehealth: Payer: Self-pay | Admitting: Obstetrics and Gynecology

## 2021-01-12 ENCOUNTER — Encounter: Payer: No Typology Code available for payment source | Admitting: Family

## 2021-01-12 DIAGNOSIS — R062 Wheezing: Secondary | ICD-10-CM

## 2021-01-12 LAB — HEPATITIS C ANTIBODY
Hepatitis C Ab: NONREACTIVE
SIGNAL TO CUT-OFF: 0 (ref <1.00)

## 2021-01-12 LAB — BETA HCG QUANT (REF LAB): hCG Quant: 2547 m[IU]/mL

## 2021-01-12 MED ORDER — ALBUTEROL SULFATE HFA 108 (90 BASE) MCG/ACT IN AERS: 2.0000 | INHALATION_SPRAY | Freq: Four times a day (QID) | RESPIRATORY_TRACT | 1 refills | Status: DC | PRN

## 2021-01-12 NOTE — Telephone Encounter (Signed)
Called to check on Mary Richards. She received MTX last night. She reports she is having some cramping today, but not as much as Sunday. Encouraged to come to the hospital if pain worsens.   Adrian Prows MD, [facility], [facility] 01/12/2021 1:56 PM

## 2021-01-14 ENCOUNTER — Encounter: Payer: Self-pay | Admitting: Obstetrics and Gynecology

## 2021-01-14 ENCOUNTER — Ambulatory Visit
Admission: RE | Admit: 2021-01-14 | Discharge: 2021-01-14 | Disposition: A | Discharge status: Home / Self Care | Payer: No Typology Code available for payment source | Source: Ambulatory Visit | Attending: Obstetrics and Gynecology | Admitting: Obstetrics and Gynecology

## 2021-01-14 ENCOUNTER — Other Ambulatory Visit: Payer: Self-pay

## 2021-01-14 ENCOUNTER — Other Ambulatory Visit: Payer: Self-pay | Admitting: Obstetrics and Gynecology

## 2021-01-14 DIAGNOSIS — O00102 Left tubal pregnancy without intrauterine pregnancy: Secondary | ICD-10-CM

## 2021-01-14 DIAGNOSIS — Z3490 Encounter for supervision of normal pregnancy, unspecified, unspecified trimester: Secondary | ICD-10-CM | POA: Diagnosis not present

## 2021-01-14 DIAGNOSIS — R110 Nausea: Secondary | ICD-10-CM

## 2021-01-14 LAB — CBC
HCT: 44.6 % (ref 36.0–46.0)
Hemoglobin: 14.6 g/dL (ref 12.0–15.0)
MCH: 30.1 pg (ref 26.0–34.0)
MCHC: 32.7 g/dL (ref 30.0–36.0)
MCV: 92 fL (ref 80.0–100.0)
Platelets: 320 10*3/uL (ref 150–400)
RBC: 4.85 MIL/uL (ref 3.87–5.11)
RDW: 12.9 % (ref 11.5–15.5)
WBC: 10.3 10*3/uL (ref 4.0–10.5)
nRBC: 0 % (ref 0.0–0.2)

## 2021-01-14 LAB — HCG, QUANTITATIVE, PREGNANCY: hCG, Beta Chain, Quant, S: 6983 m[IU]/mL — ABNORMAL HIGH (ref <5)

## 2021-01-14 MED ORDER — PROMETHAZINE HCL 25 MG PO TABS: 25.0000 mg | ORAL_TABLET | Freq: Four times a day (QID) | ORAL | 0 refills | Status: DC | PRN

## 2021-01-14 NOTE — Progress Notes (Signed)
Here for lab draw.  States that she started having more severe cramping last evening "not as bad as the first day", "not much bleeding".

## 2021-01-14 NOTE — Progress Notes (Signed)
I saw the patient in the PACU where she was located to get her labs. She notes cramping and discomfort in her lower abdomen, generally across her entire lower abdomen. She states that this pain is significantly less than the pain she had earlier in the week when she first got her MTX injection.   She has some nausea.  She denies significant vaginal bleeding apart from some spotting.   She denies feeling lightheaded or dizzy, apart from a small amount of this that is expected with the medication.  On exam her abdomen is soft, mildly ttp in the general lower abdomen. No rebound tenderness.  No guarding.  Her hCG has increase today to 6,983, up from 3,604.  There is some expected increase between days 1 and 4 after MTX.  Her CBC shows an essentially stable h/h.  In terms of her symptoms, we discussed that this can be a normal response to the injection.   I did offer her surgery for definitive management. However, we discussed that her abdomen was non-surgical at this point.  I did give her precautions for worsening abdominal pain and increased vaginal bleeding.  We discussed that there is still a risk that the MTX might not work and that she might get worse quickly or that she might not and she might need either another round of MTX or surgery, depending on symptoms. She voiced understanding of all this discussion and voiced agreement not to proceed with surgery today. She voiced understanding of the precautions I provided in case symptoms deteriorate over the next few days.  Dr. Gilman Schmidt will send in phenergan for nausea for her.     Prentice Docker, MD, [facility], [facility] 01/14/2021 3:53 PM

## 2021-01-17 ENCOUNTER — Other Ambulatory Visit: Payer: Self-pay | Admitting: Obstetrics and Gynecology

## 2021-01-17 ENCOUNTER — Ambulatory Visit
Admission: RE | Admit: 2021-01-17 | Discharge: 2021-01-17 | Disposition: A | Discharge status: Home / Self Care | Payer: No Typology Code available for payment source | Source: Ambulatory Visit | Attending: Obstetrics and Gynecology | Admitting: Obstetrics and Gynecology

## 2021-01-17 ENCOUNTER — Other Ambulatory Visit: Payer: Self-pay

## 2021-01-17 DIAGNOSIS — O00102 Left tubal pregnancy without intrauterine pregnancy: Secondary | ICD-10-CM | POA: Insufficient documentation

## 2021-01-17 LAB — HCG, QUANTITATIVE, PREGNANCY: hCG, Beta Chain, Quant, S: 7958 m[IU]/mL — ABNORMAL HIGH (ref <5)

## 2021-01-17 MED ORDER — METHOTREXATE SODIUM CHEMO INJECTION 250 MG/10ML
50.0000 mg/m2 | Freq: Once | INTRAMUSCULAR | Status: AC
  Administered 2021-01-17: 102.5 mg via INTRAMUSCULAR
  Filled 2021-01-17: qty 4.1

## 2021-01-17 MED ORDER — METHOTREXATE FOR ECTOPIC PREGNANCY: 50.0000 mg/m2 | Freq: Once | INTRAMUSCULAR | Status: DC

## 2021-01-17 MED ORDER — METHOTREXATE FOR ECTOPIC PREGNANCY
50.0000 mg/m2 | Freq: Once | INTRAMUSCULAR | Status: DC
  Filled 2021-01-17: qty 1

## 2021-01-17 MED ORDER — METHOTREXATE SODIUM CHEMO INJECTION 250 MG/10ML: 50.0000 mg/m2 | Freq: Once | INTRAMUSCULAR | Status: DC

## 2021-01-17 NOTE — Progress Notes (Signed)
Pt arrived via ambulation to [facility] from home @ 0900 blood drawn by lab. Lab results called to Dr Gilman Schmidt and methotrexate ordered. .Methotrexated prepared at the pharmacy at the [facility]. Pt asked to return at 1500, methotrexate Given in 2 doses right and  left ventrogluteal  IM. Pt tolerated injections well denies allergic symptoms. D/C instructions given to pt verbalized understanding. Pt D/C'd home via ambulation accompanied by husband.

## 2021-01-17 NOTE — Progress Notes (Signed)
Patient presented to [facility] for beta hcg monitoring  Day 1: 3,604 Day 4: 6,983 Day 7: 7,958  Discussed option to repeat methotrexate today on Day 7 versus surgery. Mary Richards has been feeling less pain. She is not having dizziness or fatigue. She would like to proceed with a second dose of methotrexate.   Plan to repeat beta hcg on day 14: 01/24/2021.   Patient has follow up with me in office on 01/20/2021, she will keep that appointment.  Continue home ectopic pregnancy precautions and pelvic rest.   Adrian Prows MD, [facility] 01/17/2021 10:58 AM

## 2021-01-17 NOTE — Patient Instructions (Signed)
 Methotrexate injection What is this medicine? METHOTREXATE (METH oh TREX ate) is a chemotherapy medicine. It treats certain types of cancer. Some of the cancers treated are breast cancer, head and neck cancer, leukemia, lymphoma, and osteosarcoma. This medicine can also be used to treat psoriasis and certain kinds of arthritis. This medicine may be used for other purposes; ask your health care provider or pharmacist if you have questions. What should I tell my health care provider before I take this medicine? They need to know if you have any of these conditions:  fluid in the stomach area or lungs  if you often drink alcohol  infection or immune system problems  kidney disease  liver disease  low blood counts (white cells, platelets, or red blood cells)  lung disease  recent or ongoing radiation  recent or upcoming vaccine  stomach ulcers  ulcerative colitis  an unusual or allergic reaction to methotrexate, other medicines, foods, dyes, or preservatives  pregnant or trying to get pregnant  breast-feeding How should I use this medicine? This medicine is for infusion into a vein or for injection into muscle or into the spinal fluid (whichever applies). It is usually given by a health care professional in a hospital or clinic setting. In rare cases, you might get this medicine at home. You will be taught how to give this medicine. Use exactly as directed. Take your medicine at regular intervals. Do not take your medicine more often than directed. If this medicine is used for arthritis or psoriasis, it should be taken weekly, NOT daily. It is important that you put your used needles and syringes in a special sharps container. Do not put them in a trash can. If you do not have a sharps container, call your pharmacist or healthcare provider to get one. Talk to your pediatrician regarding the use of this medicine in children. While this drug may be prescribed for children as  young as 2 years for selected conditions, precautions do apply. Overdosage: If you think you have taken too much of this medicine contact a poison control center or emergency room at once. NOTE: This medicine is only for you. Do not share this medicine with others. What if I miss a dose? It is important not to miss your dose. Call your doctor or health care professional if you are unable to keep an appointment. If you give yourself the medicine and you miss a dose, talk with your doctor or health care professional. Do not take double or extra doses. What may interact with this medicine? Do not take this medicine with any of the following medications:  acitretin This medicine may also interact with the following medications:  aspirin or aspirin-like medicines including salicylates  azathioprine  certain antibiotics like chloramphenicol, penicillin, tetracycline  certain medicines that treat or prevent blood clots like warfarin, apixaban, dabigatran, and rivaroxaban  certain medicines for stomach problems like esomeprazole, omeprazole, pantoprazole  cyclosporine  dapsone  diuretics  folic acid  gold  hydroxychloroquine  live virus vaccines  medicines for infection like acyclovir, adefovir, amphotericin B, bacitracin, cidofovir, foscarnet, ganciclovir, gentamicin, pentamidine, vancomycin  mercaptopurine  NSAIDs, medicines for pain and inflammation, like ibuprofen or naproxen  pamidronate  pemetrexed  penicillamine  phenylbutazone  phenytoin  probenacid  pyrimethamine  retinoids such as isotretinoin and tretinoin  steroid medicines like prednisone or cortisone  sulfonamides like sulfasalazine and trimethoprim/sulfamethoxazole  theophylline  zoledronic acid This list may not describe all possible interactions. Give your health care provider   a list of all the medicines, herbs, non-prescription drugs, or dietary supplements you use. Also tell them if you  smoke, drink alcohol, or use illegal drugs. Some items may interact with your medicine. What should I watch for while using this medicine? This medicine may make you feel generally unwell. This is not uncommon as chemotherapy can affect healthy cells as well as cancer cells. Report any side effects. Continue your course of treatment even though you feel ill unless your health care provider tells you to stop. Your condition will be monitored carefully while you are receiving this medicine. Avoid alcoholic drinks. This medicine can cause serious side effects. To reduce the risk, your health care provider may give you other medicines to take before receiving this one. Be sure to follow the directions from your health care provider. This medicine can make you more sensitive to the sun. Keep out of the sun. If you cannot avoid being in the sun, wear protective clothing and use sunscreen. Do not use sun lamps or tanning beds/booths. You may get drowsy or dizzy. Do not drive, use machinery, or do anything that needs mental alertness until you know how this medicine affects you. Do not stand or sit up quickly, especially if you are an older patient. This reduces the risk of dizzy or fainting spells. You may need blood work while you are taking this medicine. Call your doctor or health care professional for advice if you get a fever, chills or sore throat, or other symptoms of a cold or flu. Do not treat yourself. This drug decreases your body's ability to fight infections. Try to avoid being around people who are sick. This medicine may increase your risk to bruise or bleed. Call your doctor or health care professional if you notice any unusual bleeding. Be careful brushing or flossing your teeth or using a toothpick because you may get an infection or bleed more easily. If you have any dental work done, tell your dentist you are receiving this medicine Check with your doctor or health care professional if you  get an attack of severe diarrhea, nausea and vomiting, or if you sweat a lot. The loss of too much body fluid can make it dangerous for you to take this medicine. Talk to your doctor about your risk of cancer. You may be more at risk for certain types of cancers if you take this medicine. Do not become pregnant while taking this medicine or for 6 months after stopping it. Women should inform their health care provider if they wish to become pregnant or think they might be pregnant. Men should not father a child while taking this medicine and for 3 months after stopping it. There is potential for serious harm to an unborn child. Talk to your health care provider for more information. Do not breast-feed an infant while taking this medicine or for 1 week after stopping it. This medicine may make it more difficult to get pregnant or father a child. Talk to your health care provider if you are concerned about your fertility. What side effects may I notice from receiving this medicine? Side effects that you should report to your doctor or health care professional as soon as possible:  allergic reactions like skin rash, itching or hives, swelling of the face, lips, or tongue  back pain  breathing problems or shortness of breath  confusion  diarrhea  dry, nonproductive cough  low blood counts - this medicine may decrease the number of white   blood cells, red blood cells and platelets. You may be at increased risk of infections and bleeding  mouth sores  redness, blistering, peeling or loosening of the skin, including inside the mouth  seizures  severe headaches  signs of infection - fever or chills, cough, sore throat, pain or difficulty passing urine  signs and symptoms of bleeding such as bloody or black, tarry stools; red or dark-brown urine; spitting up blood or brown material that looks like coffee grounds; red spots on the skin; unusual bruising or bleeding from the eye, gums, or  nose  signs and symptoms of kidney injury like trouble passing urine or change in the amount of urine  signs and symptoms of liver injury like dark yellow or brown urine; general ill feeling or flu-like symptoms; light-colored stools; loss of appetite; nausea; right upper belly pain; unusually weak or tired; yellowing of the eyes or skin  stiff neck  vomiting Side effects that usually do not require medical attention (report to your doctor or health care professional if they continue or are bothersome):  dizziness  hair loss  headache  stomach pain  upset stomach This list may not describe all possible side effects. Call your doctor for medical advice about side effects. You may report side effects to FDA at 1-800-FDA-1088. Where should I keep my medicine? This medicine is given in a hospital or clinic. It will not be stored at home. NOTE: This sheet is a summary. It may not cover all possible information. If you have questions about this medicine, talk to your doctor, pharmacist, or health care provider.  2021 Elsevier/Gold Standard (2020-03-17 10:19:36)  

## 2021-01-20 ENCOUNTER — Other Ambulatory Visit: Payer: Self-pay

## 2021-01-20 ENCOUNTER — Encounter: Payer: Self-pay | Admitting: Obstetrics and Gynecology

## 2021-01-20 ENCOUNTER — Ambulatory Visit (INDEPENDENT_AMBULATORY_CARE_PROVIDER_SITE_OTHER): Payer: No Typology Code available for payment source | Admitting: Obstetrics and Gynecology

## 2021-01-20 VITALS — BP 122/70 | Ht 67.0 in | Wt 200.8 lb

## 2021-01-20 DIAGNOSIS — O00102 Left tubal pregnancy without intrauterine pregnancy: Secondary | ICD-10-CM | POA: Diagnosis not present

## 2021-01-21 LAB — CBC
Hematocrit: 42.3 % (ref 34.0–46.6)
Hemoglobin: 14.2 g/dL (ref 11.1–15.9)
MCH: 30.9 pg (ref 26.6–33.0)
MCHC: 33.6 g/dL (ref 31.5–35.7)
MCV: 92 fL (ref 79–97)
Platelets: 340 10*3/uL (ref 150–450)
RBC: 4.6 x10E6/uL (ref 3.77–5.28)
RDW: 12.2 % (ref 11.7–15.4)
WBC: 11.7 10*3/uL — ABNORMAL HIGH (ref 3.4–10.8)

## 2021-01-21 LAB — BETA HCG QUANT (REF LAB): hCG Quant: 4851 m[IU]/mL

## 2021-01-21 NOTE — Progress Notes (Signed)
Patient ID: Mary Richards, female   DOB: [birth year], 35 y.o.   MRN: [id number]  Reason for Consult: Gynecologic Exam   Referred by Burnard Hawthorne, FNP  Subjective:     HPI:  Mary Richards is a 35 y.o. female. She is following up regarding ectopic pregnancy. She reports her pain has been controlled and remains less then what it had been before I saw her. She is not having symptoms of anemia. She received a second dose of methotrexate on Sunday. She would like to repeat her beta hcg early.  Past Medical History:  Diagnosis Date  . Asthma   . Chronic migraine   . Complication of anesthesia   . DDD (degenerative disc disease), lumbosacral   . Family history of adverse reaction to anesthesia    Mother and Father - PONV  . PONV (postoperative nausea and vomiting)   . Wears contact lenses    Family History  Problem Relation Age of Onset  . Brain cancer Father 26  . Valvular heart disease Sister   . Brain cancer Paternal Grandmother 35  . COPD Maternal Grandfather   . Irritable bowel syndrome Mother   . Thyroid cancer Neg Hx   . Colon cancer Neg Hx    Past Surgical History:  Procedure Laterality Date  . CHOLECYSTECTOMY    . PILONIDAL CYST EXCISION    . TONSILECTOMY, ADENOIDECTOMY, BILATERAL MYRINGOTOMY AND TUBES    . TYMPANOPLASTY Right 05/10/2019   Procedure: TYMPANOPLASTY;  Surgeon: Beverly Gust, MD;  Location: [place];  Service: ENT;  Laterality: Right;    Short Social History:  Social History   Tobacco Use  . Smoking status: Never Smoker  . Smokeless tobacco: Never Used  Substance Use Topics  . Alcohol use: Yes    Alcohol/week: 2.0 standard drinks    Types: 2 Standard drinks or equivalent per week    Comment: Social     Allergies  Allergen Reactions  . Azithromycin Hives, Shortness Of Breath and Swelling    Throat swelling  . Sulfa Antibiotics Hives  . Ciprofloxacin Swelling  . Moxifloxacin Hcl Hives  . Bactrim  [Sulfamethoxazole-Trimethoprim] Rash    Current Outpatient Medications  Medication Sig Dispense Refill  . albuterol (VENTOLIN HFA) 108 (90 Base) MCG/ACT inhaler Inhale 2 puffs into the lungs every 6 (six) hours as needed for wheezing or shortness of breath. 8 g 1  . azelastine (ASTELIN) 0.1 % nasal spray Place 1 spray into both nostrils 2 (two) times daily. Use in each nostril as directed 30 mL 0  . gabapentin (NEURONTIN) 100 MG capsule Take 2 capsules (200 mg total) by mouth at bedtime. 120 capsule 3  . Multiple Vitamin (MULTI-VITAMIN) tablet Take by mouth.    . Probiotic Product (PROBIOTIC ADVANCED PO) Take 1 capsule by mouth daily.    . promethazine (PHENERGAN) 25 MG tablet Take 1 tablet (25 mg total) by mouth every 6 (six) hours as needed for nausea or vomiting. 30 tablet 0  . saccharomyces boulardii (FLORASTOR) 250 MG capsule Take 1 capsule (250 mg total) by mouth 2 (two) times daily. 120 capsule 1  . traZODone (DESYREL) 50 MG tablet TAKE 1/2 TO 1 TABLET BY MOUTH AT BEDTIME AS NEEDED FOR SLEEP. 90 tablet 0  . VITAMIN D PO Take 5,000 mg by mouth daily.    . fexofenadine (ALLEGRA) 180 MG tablet Take 180 mg by mouth daily as needed. (Patient not taking: Reported on 01/20/2021)     No current facility-administered medications  for this visit.    Review of Systems  Constitutional: Negative for chills, fatigue, fever and unexpected weight change.  HENT: Negative for trouble swallowing.  Eyes: Negative for loss of vision.  Respiratory: Negative for cough, shortness of breath and wheezing.  Cardiovascular: Negative for chest pain, leg swelling, palpitations and syncope.  GI: Negative for abdominal pain, blood in stool, diarrhea, nausea and vomiting.  GU: Negative for difficulty urinating, dysuria, frequency and hematuria.  Musculoskeletal: Negative for back pain, leg pain and joint pain.  Skin: Negative for rash.  Neurological: Negative for dizziness, headaches, light-headedness, numbness and  seizures.  Psychiatric: Negative for behavioral problem, confusion, depressed mood and sleep disturbance.        Objective:  Objective   Vitals:   01/20/21 1602  BP: 122/70  Weight: 200 lb 12.8 oz (91.1 kg)  Height: 5\' 7"  (1.702 m)   Body mass index is 31.45 kg/m.  Physical Exam Vitals and nursing note reviewed. Exam conducted with a chaperone present.  Constitutional:      Appearance: Normal appearance.  HENT:     Head: Normocephalic and atraumatic.  Eyes:     Extraocular Movements: Extraocular movements intact.     Pupils: Pupils are equal, round, and reactive to light.  Cardiovascular:     Rate and Rhythm: Normal rate and regular rhythm.  Pulmonary:     Effort: Pulmonary effort is normal.     Breath sounds: Normal breath sounds.  Abdominal:     General: Abdomen is flat. There is no distension.     Palpations: Abdomen is soft.     Tenderness: There is no abdominal tenderness. There is no guarding or rebound.  Musculoskeletal:     Cervical back: Normal range of motion.  Skin:    General: Skin is warm and dry.  Neurological:     General: No focal deficit present.     Mental Status: She is alert and oriented to person, place, and time.  Psychiatric:        Behavior: Behavior normal.        Thought Content: Thought content normal.        Judgment: Judgment normal.     Assessment/Plan:     35 yo with ectopic pregnancy Continue with weekly monitoring.  Will go to outpatient lab today.  Will discuss plan on phone once beta hcg has resulted Continue ectopic precautions.   More than 20 minutes were spent face to face with the patient in the room, reviewing the medical record, labs and images, and coordinating care for the patient. The plan of management was discussed in detail and counseling was provided.    Adrian Prows MD [facility], [facility] 01/21/2021 2:16 PM

## 2021-01-23 ENCOUNTER — Emergency Department: Payer: No Typology Code available for payment source

## 2021-01-23 ENCOUNTER — Other Ambulatory Visit: Payer: Self-pay

## 2021-01-23 ENCOUNTER — Emergency Department: Payer: No Typology Code available for payment source | Admitting: Certified Registered"

## 2021-01-23 ENCOUNTER — Encounter
Admission: EM | Disposition: A | Discharge status: Home / Self Care | Payer: Self-pay | Source: Home / Self Care | Attending: Emergency Medicine

## 2021-01-23 ENCOUNTER — Encounter: Payer: Self-pay | Admitting: Intensive Care

## 2021-01-23 ENCOUNTER — Observation Stay
Admission: EM | Admit: 2021-01-23 | Discharge: 2021-01-24 | Disposition: A | Discharge status: Home / Self Care | Payer: No Typology Code available for payment source | Attending: Obstetrics and Gynecology | Admitting: Obstetrics and Gynecology

## 2021-01-23 DIAGNOSIS — K661 Hemoperitoneum: Secondary | ICD-10-CM

## 2021-01-23 DIAGNOSIS — Z79899 Other long term (current) drug therapy: Secondary | ICD-10-CM | POA: Diagnosis not present

## 2021-01-23 DIAGNOSIS — R102 Pelvic and perineal pain: Secondary | ICD-10-CM

## 2021-01-23 DIAGNOSIS — O00102 Left tubal pregnancy without intrauterine pregnancy: Secondary | ICD-10-CM | POA: Diagnosis not present

## 2021-01-23 DIAGNOSIS — Z20822 Contact with and (suspected) exposure to covid-19: Secondary | ICD-10-CM | POA: Insufficient documentation

## 2021-01-23 DIAGNOSIS — O99511 Diseases of the respiratory system complicating pregnancy, first trimester: Secondary | ICD-10-CM | POA: Diagnosis not present

## 2021-01-23 DIAGNOSIS — J45909 Unspecified asthma, uncomplicated: Secondary | ICD-10-CM | POA: Insufficient documentation

## 2021-01-23 DIAGNOSIS — O009 Unspecified ectopic pregnancy without intrauterine pregnancy: Secondary | ICD-10-CM

## 2021-01-23 HISTORY — DX: Hemoperitoneum: K66.1

## 2021-01-23 HISTORY — PX: DIAGNOSTIC LAPAROSCOPY WITH REMOVAL OF ECTOPIC PREGNANCY: SHX6449

## 2021-01-23 HISTORY — DX: Hemoperitoneum: O00.102

## 2021-01-23 LAB — CBC
HCT: 43.6 % (ref 36.0–46.0)
HCT: 37 % (ref 36.0–46.0)
Hemoglobin: 14.2 g/dL (ref 12.0–15.0)
Hemoglobin: 12.2 g/dL (ref 12.0–15.0)
MCH: 30 pg (ref 26.0–34.0)
MCH: 30.7 pg (ref 26.0–34.0)
MCHC: 32.6 g/dL (ref 30.0–36.0)
MCHC: 33 g/dL (ref 30.0–36.0)
MCV: 92.2 fL (ref 80.0–100.0)
MCV: 93.2 fL (ref 80.0–100.0)
Platelets: 331 10*3/uL (ref 150–400)
Platelets: 275 10*3/uL (ref 150–400)
RBC: 4.73 MIL/uL (ref 3.87–5.11)
RBC: 3.97 MIL/uL (ref 3.87–5.11)
RDW: 13.2 % (ref 11.5–15.5)
RDW: 13.2 % (ref 11.5–15.5)
WBC: 11.7 10*3/uL — ABNORMAL HIGH (ref 4.0–10.5)
WBC: 16.2 10*3/uL — ABNORMAL HIGH (ref 4.0–10.5)
nRBC: 0 % (ref 0.0–0.2)
nRBC: 0 % (ref 0.0–0.2)

## 2021-01-23 LAB — RESP PANEL BY RT-PCR (FLU A&B, COVID) ARPGX2
Influenza A by PCR: NEGATIVE
Influenza B by PCR: NEGATIVE
SARS Coronavirus 2 by RT PCR: NEGATIVE

## 2021-01-23 LAB — URINALYSIS, COMPLETE (UACMP) WITH MICROSCOPIC
Bacteria, UA: NONE SEEN
Bilirubin Urine: NEGATIVE
Glucose, UA: NEGATIVE mg/dL
Ketones, ur: 20 mg/dL — AB
Nitrite: NEGATIVE
Protein, ur: NEGATIVE mg/dL
Specific Gravity, Urine: 1.015 (ref 1.005–1.030)
pH: 5 (ref 5.0–8.0)

## 2021-01-23 LAB — LIPASE, BLOOD: Lipase: 34 U/L (ref 11–51)

## 2021-01-23 LAB — TYPE AND SCREEN
ABO/RH(D): A POS
Antibody Screen: NEGATIVE

## 2021-01-23 LAB — COMPREHENSIVE METABOLIC PANEL
ALT: 15 U/L (ref 0–44)
AST: 19 U/L (ref 15–41)
Albumin: 4.3 g/dL (ref 3.5–5.0)
Alkaline Phosphatase: 74 U/L (ref 38–126)
Anion gap: 9 (ref 5–15)
BUN: 13 mg/dL (ref 6–20)
CO2: 23 mmol/L (ref 22–32)
Calcium: 9.3 mg/dL (ref 8.9–10.3)
Chloride: 106 mmol/L (ref 98–111)
Creatinine, Ser: 0.64 mg/dL (ref 0.44–1.00)
GFR, Estimated: >60 mL/min (ref >60)
Glucose, Bld: 95 mg/dL (ref 70–99)
Potassium: 3.8 mmol/L (ref 3.5–5.1)
Sodium: 138 mmol/L (ref 135–145)
Total Bilirubin: 1.2 mg/dL (ref 0.3–1.2)
Total Protein: 7 g/dL (ref 6.5–8.1)

## 2021-01-23 LAB — HCG, QUANTITATIVE, PREGNANCY: hCG, Beta Chain, Quant, S: 5393 m[IU]/mL — ABNORMAL HIGH (ref <5)

## 2021-01-23 LAB — ABO/RH: ABO/RH(D): A POS

## 2021-01-23 SURGERY — LAPAROSCOPY, WITH ECTOPIC PREGNANCY SURGICAL TREATMENT
Anesthesia: General | Laterality: Left

## 2021-01-23 MED ORDER — KETOROLAC TROMETHAMINE 30 MG/ML IJ SOLN
INTRAMUSCULAR | Status: AC
  Filled 2021-01-23: qty 1

## 2021-01-23 MED ORDER — MIDAZOLAM HCL 2 MG/2ML IJ SOLN
INTRAMUSCULAR | Status: AC
  Filled 2021-01-23: qty 2

## 2021-01-23 MED ORDER — ONDANSETRON HCL 4 MG/2ML IJ SOLN
INTRAMUSCULAR | Status: AC
  Administered 2021-01-23: 4 mg via INTRAVENOUS
  Filled 2021-01-23: qty 2

## 2021-01-23 MED ORDER — PROPOFOL 10 MG/ML IV BOLUS
INTRAVENOUS | Status: DC | PRN
  Administered 2021-01-23: 180 mg via INTRAVENOUS

## 2021-01-23 MED ORDER — FENTANYL CITRATE (PF) 100 MCG/2ML IJ SOLN
INTRAMUSCULAR | Status: AC
  Filled 2021-01-23: qty 2

## 2021-01-23 MED ORDER — ACETAMINOPHEN 10 MG/ML IV SOLN
INTRAVENOUS | Status: DC | PRN
  Administered 2021-01-23: 1000 mg via INTRAVENOUS

## 2021-01-23 MED ORDER — ONDANSETRON HCL 4 MG/2ML IJ SOLN: 4.0000 mg | Freq: Once | INTRAMUSCULAR | Status: AC | PRN

## 2021-01-23 MED ORDER — LIDOCAINE HCL (PF) 2 % IJ SOLN
INTRAMUSCULAR | Status: AC
  Filled 2021-01-23: qty 5

## 2021-01-23 MED ORDER — BUPIVACAINE HCL (PF) 0.5 % IJ SOLN
INTRAMUSCULAR | Status: AC
  Filled 2021-01-23: qty 30

## 2021-01-23 MED ORDER — LACTATED RINGERS IV BOLUS: 1000.0000 mL | Freq: Once | INTRAVENOUS | Status: DC

## 2021-01-23 MED ORDER — ONDANSETRON HCL 4 MG/2ML IJ SOLN
INTRAMUSCULAR | Status: DC | PRN
  Administered 2021-01-23: 4 mg via INTRAVENOUS

## 2021-01-23 MED ORDER — SODIUM CHLORIDE 0.9 % IV BOLUS
1000.0000 mL | Freq: Once | INTRAVENOUS | Status: AC
  Administered 2021-01-23: 1000 mL via INTRAVENOUS

## 2021-01-23 MED ORDER — ONDANSETRON HCL 4 MG/2ML IJ SOLN
4.0000 mg | Freq: Once | INTRAMUSCULAR | Status: AC
  Administered 2021-01-23: 4 mg via INTRAVENOUS
  Filled 2021-01-23: qty 2

## 2021-01-23 MED ORDER — HYDROMORPHONE HCL 1 MG/ML IJ SOLN
0.5000 mg | Freq: Once | INTRAMUSCULAR | Status: AC
Start: 2021-01-23 — End: 2021-01-23
  Administered 2021-01-23: 0.5 mg via INTRAVENOUS
  Filled 2021-01-23: qty 1

## 2021-01-23 MED ORDER — ACETAMINOPHEN 10 MG/ML IV SOLN
INTRAVENOUS | Status: AC
  Filled 2021-01-23: qty 100

## 2021-01-23 MED ORDER — ROCURONIUM BROMIDE 100 MG/10ML IV SOLN
INTRAVENOUS | Status: DC | PRN
  Administered 2021-01-23: 20 mg via INTRAVENOUS
  Administered 2021-01-23: 50 mg via INTRAVENOUS

## 2021-01-23 MED ORDER — DEXMEDETOMIDINE (PRECEDEX) IN NS 20 MCG/5ML (4 MCG/ML) IV SYRINGE
PREFILLED_SYRINGE | INTRAVENOUS | Status: DC | PRN
  Administered 2021-01-23: 8 ug via INTRAVENOUS
  Administered 2021-01-23: 4 ug via INTRAVENOUS
  Administered 2021-01-23: 8 ug via INTRAVENOUS

## 2021-01-23 MED ORDER — PROPOFOL 10 MG/ML IV BOLUS
INTRAVENOUS | Status: AC
  Filled 2021-01-23: qty 20

## 2021-01-23 MED ORDER — HYDROMORPHONE HCL 1 MG/ML IJ SOLN
0.5000 mg | Freq: Once | INTRAMUSCULAR | Status: AC
  Administered 2021-01-23: 0.5 mg via INTRAVENOUS
  Filled 2021-01-23: qty 1

## 2021-01-23 MED ORDER — DEXAMETHASONE SODIUM PHOSPHATE 10 MG/ML IJ SOLN
INTRAMUSCULAR | Status: AC
  Filled 2021-01-23: qty 1

## 2021-01-23 MED ORDER — DEXMEDETOMIDINE (PRECEDEX) IN NS 20 MCG/5ML (4 MCG/ML) IV SYRINGE
PREFILLED_SYRINGE | INTRAVENOUS | Status: AC
  Filled 2021-01-23: qty 5

## 2021-01-23 MED ORDER — SEVOFLURANE IN SOLN
RESPIRATORY_TRACT | Status: AC
  Filled 2021-01-23: qty 250

## 2021-01-23 MED ORDER — PROPOFOL 500 MG/50ML IV EMUL
INTRAVENOUS | Status: AC
  Filled 2021-01-23: qty 50

## 2021-01-23 MED ORDER — DEXAMETHASONE SODIUM PHOSPHATE 10 MG/ML IJ SOLN
INTRAMUSCULAR | Status: DC | PRN
  Administered 2021-01-23: 10 mg via INTRAVENOUS

## 2021-01-23 MED ORDER — PHENYLEPHRINE HCL (PRESSORS) 10 MG/ML IV SOLN
INTRAVENOUS | Status: DC | PRN
  Administered 2021-01-23: 100 ug via INTRAVENOUS

## 2021-01-23 MED ORDER — FENTANYL CITRATE (PF) 100 MCG/2ML IJ SOLN
INTRAMUSCULAR | Status: AC
  Administered 2021-01-23: 25 ug via INTRAVENOUS
  Filled 2021-01-23: qty 2

## 2021-01-23 MED ORDER — HYDROCODONE-ACETAMINOPHEN 5-325 MG PO TABS: 1.0000 | ORAL_TABLET | Freq: Four times a day (QID) | ORAL | 0 refills | Status: DC | PRN

## 2021-01-23 MED ORDER — LIDOCAINE HCL (CARDIAC) PF 100 MG/5ML IV SOSY
PREFILLED_SYRINGE | INTRAVENOUS | Status: DC | PRN
  Administered 2021-01-23: 100 mg via INTRAVENOUS

## 2021-01-23 MED ORDER — MIDAZOLAM HCL 2 MG/2ML IJ SOLN
INTRAMUSCULAR | Status: DC | PRN
  Administered 2021-01-23: 2 mg via INTRAVENOUS

## 2021-01-23 MED ORDER — IOHEXOL 300 MG/ML  SOLN: 100.0000 mL | Freq: Once | INTRAMUSCULAR | Status: DC | PRN

## 2021-01-23 MED ORDER — LACTATED RINGERS IV SOLN: INTRAVENOUS | Status: DC | PRN

## 2021-01-23 MED ORDER — SUCCINYLCHOLINE CHLORIDE 200 MG/10ML IV SOSY
PREFILLED_SYRINGE | INTRAVENOUS | Status: AC
  Filled 2021-01-23: qty 10

## 2021-01-23 MED ORDER — SCOPOLAMINE 1 MG/3DAYS TD PT72
MEDICATED_PATCH | TRANSDERMAL | Status: AC
  Filled 2021-01-23: qty 1

## 2021-01-23 MED ORDER — FENTANYL CITRATE (PF) 100 MCG/2ML IJ SOLN
25.0000 ug | INTRAMUSCULAR | Status: AC | PRN
  Administered 2021-01-23 – 2021-01-24 (×6): 25 ug via INTRAVENOUS

## 2021-01-23 MED ORDER — IBUPROFEN 600 MG PO TABS: 600.0000 mg | ORAL_TABLET | Freq: Four times a day (QID) | ORAL | 0 refills | Status: DC

## 2021-01-23 MED ORDER — FENTANYL CITRATE (PF) 100 MCG/2ML IJ SOLN
INTRAMUSCULAR | Status: DC | PRN
  Administered 2021-01-23 (×2): 50 ug via INTRAVENOUS

## 2021-01-23 MED ORDER — ONDANSETRON HCL 4 MG/2ML IJ SOLN
INTRAMUSCULAR | Status: AC
  Filled 2021-01-23: qty 2

## 2021-01-23 MED ORDER — SUGAMMADEX SODIUM 200 MG/2ML IV SOLN
INTRAVENOUS | Status: DC | PRN
  Administered 2021-01-23: 180 mg via INTRAVENOUS

## 2021-01-23 MED ORDER — ONDANSETRON HCL 4 MG/2ML IJ SOLN: 4.0000 mg | Freq: Once | INTRAMUSCULAR | Status: DC | PRN

## 2021-01-23 SURGICAL SUPPLY — 43 items
ANCHOR TIS RET SYS 235ML (MISCELLANEOUS) ×2 IMPLANT
BAG URINE DRAIN 2000ML AR STRL (UROLOGICAL SUPPLIES) ×2 IMPLANT
BLADE SURG SZ11 CARB STEEL (BLADE) ×2 IMPLANT
CATH FOLEY 2WAY  5CC 16FR (CATHETERS) ×1
CATH URTH 16FR FL 2W BLN LF (CATHETERS) ×1 IMPLANT
CHLORAPREP W/TINT 26 (MISCELLANEOUS) ×2 IMPLANT
COVER WAND RF STERILE (DRAPES) ×2 IMPLANT
DERMABOND ADVANCED (GAUZE/BANDAGES/DRESSINGS) ×1
DERMABOND ADVANCED .7 DNX12 (GAUZE/BANDAGES/DRESSINGS) ×1 IMPLANT
DRAPE 3/4 80X56 (DRAPES) ×2 IMPLANT
DRAPE LEGGINS SURG 28X43 STRL (DRAPES) ×2 IMPLANT
DRAPE UNDER BUTTOCK W/FLU (DRAPES) ×2 IMPLANT
ELECT REM PT RETURN 9FT ADLT (ELECTROSURGICAL) ×2
ELECTRODE REM PT RTRN 9FT ADLT (ELECTROSURGICAL) ×1 IMPLANT
GLOVE SURG ENC MOIS LTX SZ7 (GLOVE) ×2 IMPLANT
GLOVE SURG UNDER POLY LF SZ7.5 (GLOVE) ×2 IMPLANT
GOWN STRL REUS W/ TWL LRG LVL3 (GOWN DISPOSABLE) ×1 IMPLANT
GOWN STRL REUS W/TWL LRG LVL3 (GOWN DISPOSABLE) ×1
IRRIGATION STRYKERFLOW (MISCELLANEOUS) ×1 IMPLANT
IRRIGATOR STRYKERFLOW (MISCELLANEOUS) ×2
IV LACTATED RINGERS 1000ML (IV SOLUTION) ×2 IMPLANT
KIT PINK PAD W/HEAD ARE REST (MISCELLANEOUS) ×2
KIT PINK PAD W/HEAD ARM REST (MISCELLANEOUS) ×1 IMPLANT
LABEL OR SOLS (LABEL) ×2 IMPLANT
LIGASURE VESSEL 5MM BLUNT TIP (ELECTROSURGICAL) ×2 IMPLANT
MANIFOLD NEPTUNE II (INSTRUMENTS) ×2 IMPLANT
NEEDLE HYPO 22GX1.5 SAFETY (NEEDLE) ×2 IMPLANT
NS IRRIG 500ML POUR BTL (IV SOLUTION) ×2 IMPLANT
PACK LAP CHOLECYSTECTOMY (MISCELLANEOUS) ×2 IMPLANT
PAD OB MATERNITY 4.3X12.25 (PERSONAL CARE ITEMS) ×2 IMPLANT
PAD PREP 24X41 OB/GYN DISP (PERSONAL CARE ITEMS) ×2 IMPLANT
SET TUBE SMOKE EVAC HIGH FLOW (TUBING) ×2 IMPLANT
SLEEVE ENDOPATH XCEL 5M (ENDOMECHANICALS) ×2 IMPLANT
SOL PREP PVP 2OZ (MISCELLANEOUS) ×2
SOLUTION PREP PVP 2OZ (MISCELLANEOUS) ×1 IMPLANT
SURGILUBE 2OZ TUBE FLIPTOP (MISCELLANEOUS) ×2 IMPLANT
SUT MNCRL 4-0 (SUTURE) ×1
SUT MNCRL 4-0 27XMFL (SUTURE) ×1
SUT VIC AB 2-0 UR6 27 (SUTURE) ×2 IMPLANT
SUT VICRYL 0 AB UR-6 (SUTURE) ×2 IMPLANT
SUTURE MNCRL 4-0 27XMF (SUTURE) ×1 IMPLANT
TROCAR XCEL NON-BLD 5MMX100MML (ENDOMECHANICALS) ×2 IMPLANT
TROCAR XCEL UNIV SLVE 11M 100M (ENDOMECHANICALS) ×2 IMPLANT

## 2021-01-23 NOTE — H&P (Signed)
GYNECOLOGY HISTORY AND PHYSICAL NOTE  GYN Consultation  Attending Provider: Duffy Bruce, MD   Mary Richards [id number] 01/23/2021 8:21 PM    Reason for Consultation:   Mary Richards is a 35 y.o. G2P1001 premenopausal female seen at the request of Dr. Ellender Hose for evaluation of left ectopic pregnancy.    History of Present Ilness:   The patient had acute onset left lower quadrant pain that started at about 230PM today.  The pain is severe, doesn't radiate. Alleviating factors: sitting in a certain position makes it feel a little better.  Aggravating factors: movement.  Associated symptoms: none.  Denies fevers, chills, nausea, vomiting, significant vaginal bleeding.    Notably she has been diagnosed with a left ectopic pregnancy.  She has been treated with two doses of MTX. Her last dose was on 01/17/2021.    Her hCG levels have been as below: 01/11/21 (at 1233p): 2,547 01/11/21 (at 1744): 3,604 (day 1, first shot) 01/14/21: 6,983 (day 4, first shot) 01/17/21: 7,958 (day 7, first shot: day 1, second shot) 01/20/21: 4,851 (day 4, second shot) 01/23/21: 5,393 (day 7, second shot)  First dose of MTX: 01/11/2021 Second dose: 01/17/2021  Past Medical History:  Diagnosis Date  . Asthma   . Chronic migraine   . Complication of anesthesia   . DDD (degenerative disc disease), lumbosacral   . Family history of adverse reaction to anesthesia    Mother and Father - PONV  . PONV (postoperative nausea and vomiting)   . Wears contact lenses    Past Surgical History:  Procedure Laterality Date  . CHOLECYSTECTOMY    . PILONIDAL CYST EXCISION    . TONSILECTOMY, ADENOIDECTOMY, BILATERAL MYRINGOTOMY AND TUBES    . TYMPANOPLASTY Right 05/10/2019   Procedure: TYMPANOPLASTY;  Surgeon: Beverly Gust, MD;  Location: [facility];  Service: ENT;  Laterality: Right;   Allergies  Allergen Reactions  . Azithromycin Hives, Shortness Of Breath and Swelling    Throat swelling  . Sulfa Antibiotics Hives   . Ciprofloxacin Swelling  . Moxifloxacin Hcl Hives  . Bactrim [Sulfamethoxazole-Trimethoprim] Rash   Prior to Admission medications   Medication Sig Start Date End Date Taking? Authorizing Provider  albuterol (VENTOLIN HFA) 108 (90 Base) MCG/ACT inhaler Inhale 2 puffs into the lungs every 6 (six) hours as needed for wheezing or shortness of breath. 01/12/21   Burnard Hawthorne, FNP  azelastine (ASTELIN) 0.1 % nasal spray Place 1 spray into both nostrils 2 (two) times daily. Use in each nostril as directed 11/01/18   Darlin Priestly, PA-C  gabapentin (NEURONTIN) 100 MG capsule Take 2 capsules (200 mg total) by mouth at bedtime. 10/28/20   Burnard Hawthorne, FNP  Multiple Vitamin (MULTI-VITAMIN) tablet Take by mouth.    [provider]  Probiotic Product (PROBIOTIC ADVANCED PO) Take 1 capsule by mouth daily.    [provider]  promethazine (PHENERGAN) 25 MG tablet Take 1 tablet (25 mg total) by mouth every 6 (six) hours as needed for nausea or vomiting. 01/14/21   Schuman, Stefanie Libel, MD  saccharomyces boulardii (FLORASTOR) 250 MG capsule Take 1 capsule (250 mg total) by mouth 2 (two) times daily. 01/11/21   Burnard Hawthorne, FNP  traZODone (DESYREL) 50 MG tablet TAKE 1/2 TO 1 TABLET BY MOUTH AT BEDTIME AS NEEDED FOR SLEEP. 06/03/20   Burnard Hawthorne, FNP  VITAMIN D PO Take 5,000 mg by mouth daily.    [provider]    Social History:  She  reports that she has never smoked. She has never used smokeless tobacco. She reports current alcohol use of about 2.0 standard drinks of alcohol per week. She reports that she does not use drugs.  Family History:  family history includes Brain cancer (age of onset: 89) in her father; Brain cancer (age of onset: 68) in her paternal grandmother; COPD in her maternal grandfather; Irritable bowel syndrome in her mother; Valvular heart disease in her sister.   Review of Systems  Constitutional: Negative.   HENT: Negative.    Eyes: Negative.   Respiratory: Negative.   Cardiovascular: Negative.   Gastrointestinal: Positive for abdominal pain. Negative for blood in stool, constipation, diarrhea, melena, nausea and vomiting.  Genitourinary: Negative.   Musculoskeletal: Negative.   Skin: Negative.   Neurological: Negative.   Psychiatric/Behavioral: Negative.      Objective    BP (!) 158/91 (BP Location: Right Arm)   Pulse (!) 103   Temp (!) 97.5 F (36.4 C) (Oral)   Resp 18   Ht 5\' 6"  (1.676 m)   Wt 90.7 kg   LMP 12/19/2020   SpO2 96%   Breastfeeding Unknown Comment: recent ectopic pregnancy   BMI 32.28 kg/m  Physical Exam Constitutional:      General: She is not in acute distress.    Appearance: Normal appearance. She is well-developed.  HENT:     Head: Normocephalic and atraumatic.  Eyes:     General: No scleral icterus.    Conjunctiva/sclera: Conjunctivae normal.  Cardiovascular:     Rate and Rhythm: Normal rate and regular rhythm.     Heart sounds: No murmur heard. No friction rub. No gallop.   Pulmonary:     Effort: Pulmonary effort is normal. No respiratory distress.     Breath sounds: Normal breath sounds. No wheezing or rales.  Abdominal:     General: Bowel sounds are normal. There is no distension.     Palpations: Abdomen is soft. There is no mass.     Tenderness: There is abdominal tenderness. There is guarding and rebound.  Musculoskeletal:        General: Normal range of motion.     Cervical back: Normal range of motion and neck supple.  Neurological:     General: No focal deficit present.     Mental Status: She is alert and oriented to person, place, and time.     Cranial Nerves: No cranial nerve deficit.  Skin:    General: Skin is warm and dry.     Findings: No erythema.  Psychiatric:        Mood and Affect: Mood normal.        Behavior: Behavior normal.        Judgment: Judgment normal.     Laboratory Results:   Lab Results  Component Value Date   WBC 11.7 (H)  01/23/2021   RBC 4.73 01/23/2021   HGB 14.2 01/23/2021   HCT 43.6 01/23/2021   PLT 331 01/23/2021   NA 138 01/23/2021   K 3.8 01/23/2021   CREATININE 0.64 01/23/2021   Imaging Results US OB LESS THAN 14 WEEKS WITH OB TRANSVAGINAL  Result Date: 01/23/2021 CLINICAL DATA:  Known left adnexal ectopic pregnancy, status post methotrexate last Dose taken 5 days ago, sudden onset of pain 2 hours ago EXAM: OBSTETRIC <14 WK [place] AND TRANSVAGINAL OB US TECHNIQUE: Both transabdominal and transvaginal ultrasound examinations were performed for complete evaluation of the gestation as well as the maternal uterus, adnexal regions, and  pelvic cul-de-sac. Transvaginal technique was performed to assess early pregnancy. COMPARISON:  01/11/2021 FINDINGS: Intrauterine gestational sac: None Yolk sac:  Not Visualized. Embryo:  Not Visualized. Cardiac Activity: Not Visualized. Maternal uterus/adnexae: The uterus is anteverted measuring 9.5 x 4.8 x 5.2 cm. No uterine masses. Endometrium is slightly thickened measuring 11 mm. Within the left adnexa there is a complex cystic structure measuring 2.5 x 1.8 x 2.8 cm, likely the residual of the ectopic pregnancy seen previously. The well-formed yolk sac seen on prior exam is not identified on today's study. A fetal pole cannot be clearly visualized, there is no documented cardiac activity within this mass. Echogenic material is seen surrounding this left adnexal cystic structure, concerning for hematoma and ruptured ectopic pregnancy. Increased free fluid throughout the pelvis, which is mildly complex with internal echoes identified, consistent with hemoperitoneum. The right ovary is not well visualized. IMPRESSION: 1. Interval rupture of the ectopic pregnancy within the left adnexa, with interval development of complex fluid throughout the pelvis compatible with hemoperitoneum. Critical Value/emergent results were called by telephone at the time of interpretation on 01/23/2021 at 6:37 pm  to provider Duffy Bruce , who verbally acknowledged these results. Electronically Signed   By: Randa Ngo M.D.   On: 01/23/2021 18:41     Assessment & Plan   Pegi Milazzo is a 35 y.o. G2P1001 premenopausal female being treated conservatively with MTX and has had two injections. She has failed medical therapy and there is sonographic evidence of a ruptured left ectopic pregnancy.    Plan:  1.  To OR for laparoscopic left salpingectomy; salpingostomy, if possible. Evacuation of hemoperitoneum. 2.  Patient agrees to proceed.  Discussed risks and benefits of surgery. Risks of surgery include; bleeding, infection, damage to nearby organs like bowel, bladder, nerves, and blood vessels.  Also, there are anesthetic risks, which will be explained by the anesthesiology team.   3. All questions answered. To the OR. She has been NPO since 1130 this morning.  OR aware.    Prentice Docker, MD 01/23/2021 8:21 PM

## 2021-01-23 NOTE — ED Provider Notes (Signed)
[facility] Emergency Department Provider Note  ____________________________________________   Event Date/Time   First MD Initiated Contact with Patient 01/23/21 1659     (approximate)  I have reviewed the triage vital signs and the nursing notes.   HISTORY  Chief Complaint Abdominal Pain    HPI Mary Richards is a 35 y.o. female here with acute onset abdominal pain.  The patient recently had a left ectopic tubal pregnancy.  She has received 2 doses of methotrexate.  She presents today with acute, severe onset of left adnexal pain.  She has been having minimal pain prior to this.  She states that she has had no associated vaginal bleeding.  Pain is sharp, stabbing, and constant and worsening.  She feels mildly lightheaded.  No alleviating factors.        Past Medical History:  Diagnosis Date  . Asthma   . Chronic migraine   . Complication of anesthesia   . DDD (degenerative disc disease), lumbosacral   . Family history of adverse reaction to anesthesia    Mother and Father - PONV  . PONV (postoperative nausea and vomiting)   . Wears contact lenses     Patient Active Problem List   Diagnosis Date Noted  . Encounter for examination of blood pressure with abnormal findings 01/11/2021  . Diarrhea 12/30/2020  . Bilateral impacted cerumen 06/05/2020  . Obesity 04/29/2020  . Drug allergy 01/01/2020  . Routine physical examination 12/17/2019  . Cold intolerance 12/17/2019  . GAD (generalized anxiety disorder) 07/08/2019  . Chronic low back pain 07/08/2019  . Migraine 07/08/2019    Past Surgical History:  Procedure Laterality Date  . CHOLECYSTECTOMY    . PILONIDAL CYST EXCISION    . TONSILECTOMY, ADENOIDECTOMY, BILATERAL MYRINGOTOMY AND TUBES    . TYMPANOPLASTY Right 05/10/2019   Procedure: TYMPANOPLASTY;  Surgeon: Beverly Gust, MD;  Location: [facility];  Service: ENT;  Laterality: Right;    Prior to Admission medications    Medication Sig Start Date End Date Taking? Authorizing Provider  albuterol (VENTOLIN HFA) 108 (90 Base) MCG/ACT inhaler Inhale 2 puffs into the lungs every 6 (six) hours as needed for wheezing or shortness of breath. 01/12/21   Burnard Hawthorne, FNP  azelastine (ASTELIN) 0.1 % nasal spray Place 1 spray into both nostrils 2 (two) times daily. Use in each nostril as directed 11/01/18   Darlin Priestly, PA-C  fexofenadine (ALLEGRA) 180 MG tablet Take 180 mg by mouth daily as needed. Patient not taking: Reported on 01/20/2021    [provider]  gabapentin (NEURONTIN) 100 MG capsule Take 2 capsules (200 mg total) by mouth at bedtime. 10/28/20   Burnard Hawthorne, FNP  Multiple Vitamin (MULTI-VITAMIN) tablet Take by mouth.    [provider]  Probiotic Product (PROBIOTIC ADVANCED PO) Take 1 capsule by mouth daily.    [provider]  promethazine (PHENERGAN) 25 MG tablet Take 1 tablet (25 mg total) by mouth every 6 (six) hours as needed for nausea or vomiting. 01/14/21   Schuman, Stefanie Libel, MD  saccharomyces boulardii (FLORASTOR) 250 MG capsule Take 1 capsule (250 mg total) by mouth 2 (two) times daily. 01/11/21   Burnard Hawthorne, FNP  traZODone (DESYREL) 50 MG tablet TAKE 1/2 TO 1 TABLET BY MOUTH AT BEDTIME AS NEEDED FOR SLEEP. 06/03/20   Burnard Hawthorne, FNP  VITAMIN D PO Take 5,000 mg by mouth daily.    [provider]    Allergies Azithromycin, Sulfa  antibiotics, Ciprofloxacin, Moxifloxacin hcl, and Bactrim [sulfamethoxazole-trimethoprim]  Family History  Problem Relation Age of Onset  . Brain cancer Father 54  . Valvular heart disease Sister   . Brain cancer Paternal Grandmother 30  . COPD Maternal Grandfather   . Irritable bowel syndrome Mother   . Thyroid cancer Neg Hx   . Colon cancer Neg Hx     Social History Social History   Tobacco Use  . Smoking status: Never Smoker  . Smokeless tobacco: Never Used  Vaping Use  . Vaping Use: Never  used  Substance Use Topics  . Alcohol use: Yes    Alcohol/week: 2.0 standard drinks    Types: 2 Standard drinks or equivalent per week    Comment: Social   . Drug use: Never    Review of Systems  Review of Systems  Constitutional: Negative for fatigue and fever.  HENT: Negative for congestion and sore throat.   Eyes: Negative for visual disturbance.  Respiratory: Negative for cough and shortness of breath.   Cardiovascular: Negative for chest pain.  Gastrointestinal: Positive for abdominal pain, nausea and vomiting. Negative for diarrhea.  Genitourinary: Negative for flank pain.  Musculoskeletal: Negative for back pain and neck pain.  Skin: Negative for rash and wound.  Neurological: Negative for weakness.  All other systems reviewed and are negative.    ____________________________________________  PHYSICAL EXAM:      VITAL SIGNS: ED Triage Vitals  Enc Vitals Group     BP 01/23/21 1545 (!) 136/91     Pulse Rate 01/23/21 1545 93     Resp 01/23/21 1545 16     Temp 01/23/21 1540 (!) 97.5 F (36.4 C)     Temp Source 01/23/21 1540 Oral     SpO2 01/23/21 1545 100 %     Weight 01/23/21 1542 200 lb (90.7 kg)     Height 01/23/21 1542 5\' 6"  (1.676 m)     Head Circumference --      Peak Flow --      Pain Score 01/23/21 1542 9     Pain Loc --      Pain Edu? --      Excl. in [place]? --      Physical Exam Vitals and nursing note reviewed.  Constitutional:      General: She is not in acute distress.    Appearance: She is well-developed.  HENT:     Head: Normocephalic and atraumatic.  Eyes:     Conjunctiva/sclera: Conjunctivae normal.  Cardiovascular:     Rate and Rhythm: Regular rhythm. Tachycardia present.     Heart sounds: Normal heart sounds.  Pulmonary:     Effort: Pulmonary effort is normal. No respiratory distress.     Breath sounds: No wheezing.  Abdominal:     General: Abdomen is flat.     Tenderness: There is abdominal tenderness in the suprapubic area and  left lower quadrant.  Musculoskeletal:     Cervical back: Neck supple.  Skin:    General: Skin is warm.     Capillary Refill: Capillary refill takes less than 2 seconds.     Findings: No rash.  Neurological:     Mental Status: She is alert and oriented to person, place, and time.     Motor: No abnormal muscle tone.       ____________________________________________   LABS (all labs ordered are listed, but only abnormal results are displayed)  Labs Reviewed  CBC - Abnormal; Notable for the following components:  Result Value   WBC 11.7 (*)    All other components within normal limits  URINALYSIS, COMPLETE (UACMP) WITH MICROSCOPIC - Abnormal; Notable for the following components:   Color, Urine YELLOW (*)    APPearance HAZY (*)    Hgb urine dipstick SMALL (*)    Ketones, ur 20 (*)    Leukocytes,Ua SMALL (*)    All other components within normal limits  HCG, QUANTITATIVE, PREGNANCY - Abnormal; Notable for the following components:   hCG, Beta Chain, Quant, S 5,393 (*)    All other components within normal limits  RESP PANEL BY RT-PCR (FLU A&B, COVID) ARPGX2  LIPASE, BLOOD  COMPREHENSIVE METABOLIC PANEL  CBC  ABO/RH  TYPE AND SCREEN    ____________________________________________  EKG:  ________________________________________  RADIOLOGY All imaging, including plain films, CT scans, and ultrasounds, independently reviewed by me, and interpretations confirmed via formal radiology reads.  ED MD interpretation:   [place]: Ruptured ectopic with free fluid  Official radiology report(s): US OB LESS THAN 14 WEEKS WITH OB TRANSVAGINAL  Result Date: 01/23/2021 CLINICAL DATA:  Known left adnexal ectopic pregnancy, status post methotrexate last Dose taken 5 days ago, sudden onset of pain 2 hours ago EXAM: OBSTETRIC <14 WK [place] AND TRANSVAGINAL OB US TECHNIQUE: Both transabdominal and transvaginal ultrasound examinations were performed for complete evaluation of the gestation as  well as the maternal uterus, adnexal regions, and pelvic cul-de-sac. Transvaginal technique was performed to assess early pregnancy. COMPARISON:  01/11/2021 FINDINGS: Intrauterine gestational sac: None Yolk sac:  Not Visualized. Embryo:  Not Visualized. Cardiac Activity: Not Visualized. Maternal uterus/adnexae: The uterus is anteverted measuring 9.5 x 4.8 x 5.2 cm. No uterine masses. Endometrium is slightly thickened measuring 11 mm. Within the left adnexa there is a complex cystic structure measuring 2.5 x 1.8 x 2.8 cm, likely the residual of the ectopic pregnancy seen previously. The well-formed yolk sac seen on prior exam is not identified on today's study. A fetal pole cannot be clearly visualized, there is no documented cardiac activity within this mass. Echogenic material is seen surrounding this left adnexal cystic structure, concerning for hematoma and ruptured ectopic pregnancy. Increased free fluid throughout the pelvis, which is mildly complex with internal echoes identified, consistent with hemoperitoneum. The right ovary is not well visualized. IMPRESSION: 1. Interval rupture of the ectopic pregnancy within the left adnexa, with interval development of complex fluid throughout the pelvis compatible with hemoperitoneum. Critical Value/emergent results were called by telephone at the time of interpretation on 01/23/2021 at 6:37 pm to provider Duffy Bruce , who verbally acknowledged these results. Electronically Signed   By: Randa Ngo M.D.   On: 01/23/2021 18:41    ____________________________________________  PROCEDURES   Procedure(s) performed (including Critical Care):  .Critical Care Performed by: Duffy Bruce, MD Authorized by: Duffy Bruce, MD   Critical care provider statement:    Critical care time (minutes):  35   Critical care time was exclusive of:  Separately billable procedures and treating other patients and teaching time   Critical care was necessary to treat or  prevent imminent or life-threatening deterioration of the following conditions:  Cardiac failure, circulatory failure and respiratory failure   Critical care was time spent personally by me on the following activities:  Development of treatment plan with patient or surrogate, discussions with consultants, evaluation of patient's response to treatment, examination of patient, obtaining history from patient or surrogate, ordering and performing treatments and interventions, ordering and review of laboratory studies, ordering and review  of radiographic studies, pulse oximetry, re-evaluation of patient's condition and review of old charts   I assumed direction of critical care for this patient from another provider in my specialty: no      ____________________________________________  INITIAL IMPRESSION / MDM / [place] / ED COURSE  As part of my medical decision making, I reviewed the following data within the [place] notes reviewed and incorporated, Old chart reviewed, Notes from prior ED visits, and [place] Controlled Substance Database       *Mary Richards was evaluated in Emergency Department on 01/23/2021 for the symptoms described in the history of present illness. She was evaluated in the context of the global COVID-19 pandemic, which necessitated consideration that the patient might be at risk for infection with the SARS-CoV-2 virus that causes COVID-19. Institutional protocols and algorithms that pertain to the evaluation of patients at risk for COVID-19 are in a state of rapid change based on information released by regulatory bodies including the CDC and federal and state organizations. These policies and algorithms were followed during the patient's care in the ED.  Some ED evaluations and interventions may be delayed as a result of limited staffing during the pandemic.*     Medical Decision Making: 86 old female here with acute onset left adnexal pain.   Imaging shows ruptured ectopic pregnancy.  Type and screen sent.  Hemoglobin stable.  She is mildly tachycardic which is likely reactive as well secondary to pain.  Discussed case with Dr. Glennon Mac, patient be taken to the OR.  Covid is negative.  She did have a slight increase in her hCG.  Patient, husband updated.  Will go emergently to OR  ____________________________________________  FINAL CLINICAL IMPRESSION(S) / ED DIAGNOSES  Final diagnoses:  Ruptured ectopic pregnancy     MEDICATIONS GIVEN DURING THIS VISIT:  Medications  iohexol (OMNIPAQUE) 300 MG/ML solution 100 mL (has no administration in time range)  ondansetron (ZOFRAN) injection 4 mg (4 mg Intravenous Given 01/23/21 1701)  sodium chloride 0.9 % bolus 1,000 mL (0 mLs Intravenous Stopped 01/23/21 1913)  HYDROmorphone (DILAUDID) injection 0.5 mg (0.5 mg Intravenous Given 01/23/21 1739)  HYDROmorphone (DILAUDID) injection 0.5 mg (0.5 mg Intravenous Given 01/23/21 1902)     ED Discharge Orders    None       Note:  This document was prepared using Dragon voice recognition software and may include unintentional dictation errors.   Duffy Bruce, MD 01/23/21 2025

## 2021-01-23 NOTE — Op Note (Signed)
  Operative Note    Name: Mary Richards  Date of Service: 01/23/2021  DOB: 1986-01-20  MRN: [id number]   Pre-Operative Diagnosis:  Ruptured left tubal ectopic pregnancy causing hemoperitoneum [O00.102, K66.1]  Post-Operative Diagnosis:  Ruptured left tubal ectopic pregnancy causing hemoperitoneum [O00.102, K66.1]  Procedures:  1. Laparoscopic left salpingectomy, removal of ectopic pregnancy 2. Evacuation of hemoperitoneum   Primary Surgeon: Prentice Docker, MD   EBL: 400 mL (approximately 10 mL surgical blood loss, 390 mL hemoperitoneum)  IVF: 800 mL   Urine output: 600 mL  Specimens: left fallopian tube with ectopic pregnancy  Drains: none  Complications: None   Disposition: PACU   Condition: Stable   Findings:  1) hemoperitoneum 2) significantly dilated left fallopian tube with multiple dilated areas with one dominant one in the mid-isthmus region 3) mild adhesions of anterior abdominal wall to cecum. Appendix not visualized  Procedure Summary:  The patient was taken to the operating room where general anesthesia was administered and found to be adequate. She was placed in the dorsal supine lithotomy position in [place] stirrups and prepped and draped in usual sterile fashion. After a timeout was called an indwelling catheter was placed in her bladder.   Attention was turned to the abdomen where after injection of local anesthetic, a 5 mm infraumbilical incision was made with the scalpel. Entry into the abdomen was obtained via Optiview trocar technique (a blunt entry technique with camera visualization through the obturator upon entry). Verification of entry into the abdomen was obtained using opening pressures. The abdomen was insufflated with CO2. The camera was introduced through the trocar with verification of atraumatic entry.  A 5 mm right lower quadrant port site and 11 mm suprapubic port site were created under direct intraabdominal visualization without difficulty.     A quick inspection of the pelvis and abdomen was undertaken with the above-noted findings. Given the multiple dilations of the fallopian tube and significant engorgement of the fallopian tube, the decision was made to perform salpingectomy.  Using the Ligasure device the mesosalpinx was utilized to cauterize and transect this tissue.  Hemostasis was verified. An EndoCatch pouch was placed through the 11 mm port site and the specimen was placed in the collection bag. The bag was removed with the specimen intact.  The hemoperitoneum was evacuated and irrigation was undertaken.  Hemostasis was again verified along the salpingectomy line after dropping the intraperitoneal pressure to 5 mm Hg. .    The 11 mm trocar was removed and the fascia was re-approximated with a single 0-vicryl stitch.The abdomen was emptied of CO2 with the aid of 5 deep breaths from anesthesia.  The trocars were removed without difficulty. The skin at the 11 mm incision site was reapproximated using 4-0 monocryl.  Surgical skin glue was used to close all incision sites.   The Foley catheter was removed from the vagina. A vaginal sweep was performed to verify no instruments or sponges were left behind.   The patient tolerated the procedure well.  Sponge, lap, needle, and instrument counts were correct x 2.  VTE prophylaxis: SCDs. Antibiotic prophylaxis: none indicated and none given. She was awakened in the operating room and was taken to the PACU in stable condition.   Prentice Docker, MD 01/23/2021 10:53 PM

## 2021-01-23 NOTE — Anesthesia Preprocedure Evaluation (Signed)
Anesthesia Evaluation  Patient identified by MRN, date of birth, ID band Patient awake    Reviewed: Allergy & Precautions, NPO status , Patient's Chart, lab work & pertinent test results  History of Anesthesia Complications (+) PONV and history of anesthetic complications  Airway Mallampati: II       Dental   Pulmonary asthma (with URI) , neg sleep apnea, neg COPD, Not current smoker,           Cardiovascular (-) hypertension(-) Past MI and (-) CHF (-) dysrhythmias (-) Valvular Problems/Murmurs  Low blood pressures   Neuro/Psych neg Seizures Anxiety    GI/Hepatic Neg liver ROS, GERD  ,  Endo/Other  neg diabetes  Renal/GU negative Renal ROS     Musculoskeletal   Abdominal   Peds  Hematology   Anesthesia Other Findings   Reproductive/Obstetrics                             Anesthesia Physical Anesthesia Plan  ASA: II and emergent  Anesthesia Plan: General   Post-op Pain Management:    Induction:   PONV Risk Score and Plan: 4 or greater and Ondansetron, Dexamethasone and Scopolamine patch - Pre-op  Airway Management Planned: Oral ETT  Additional Equipment:   Intra-op Plan:   Post-operative Plan:   Informed Consent: I have reviewed the patients History and Physical, chart, labs and discussed the procedure including the risks, benefits and alternatives for the proposed anesthesia with the patient or authorized representative who has indicated his/her understanding and acceptance.       Plan Discussed with:   Anesthesia Plan Comments:         Anesthesia Quick Evaluation

## 2021-01-23 NOTE — Transfer of Care (Signed)
Immediate Anesthesia Transfer of Care Note  Patient: Mary Richards  Procedure(s) Performed: DIAGNOSTIC LAPAROSCOPY WITH REMOVAL OF ECTOPIC PREGNANCY (Left )  Patient Location: PACU  Anesthesia Type:General  Level of Consciousness: drowsy and patient cooperative  Airway & Oxygen Therapy: Patient Spontanous Breathing and Patient connected to face mask oxygen  Post-op Assessment: Report given to RN and Post -op Vital signs reviewed and stable  Post vital signs: Reviewed and stable  Last Vitals:  Vitals Value Taken Time  BP    Temp    Pulse    Resp    SpO2      Last Pain:  Vitals:   01/23/21 1957  TempSrc:   PainSc: 9       Patients Stated Pain Goal: 0 (00/17/49 4496)  Complications: No complications documented.

## 2021-01-23 NOTE — Anesthesia Procedure Notes (Signed)
Procedure Name: Intubation Date/Time: 01/23/2021 9:17 PM Performed by: Jerrye Noble, CRNA Pre-anesthesia Checklist: Patient identified, Emergency Drugs available, Suction available and Patient being monitored Patient Re-evaluated:Patient Re-evaluated prior to induction Oxygen Delivery Method: Circle system utilized Preoxygenation: Pre-oxygenation with 100% oxygen Induction Type: IV induction Ventilation: Mask ventilation without difficulty Laryngoscope Size: McGraph and 3 Grade View: Grade I Tube type: Oral Tube size: 7.0 mm Number of attempts: 1 Airway Equipment and Method: Stylet and Video-laryngoscopy Placement Confirmation: ETT inserted through vocal cords under direct vision,  positive ETCO2 and breath sounds checked- equal and bilateral Secured at: 22 cm Tube secured with: Tape Dental Injury: Teeth and Oropharynx as per pre-operative assessment

## 2021-01-23 NOTE — ED Triage Notes (Addendum)
Diagnosed with ectopic pregnancy X2 weeks ago. Took last dose of methotrexate 01/10/21. Reports extreme abdominal pain that started today. Reports labor pains. Denies bleeding at this time.

## 2021-01-24 ENCOUNTER — Other Ambulatory Visit: Payer: Self-pay | Admitting: Advanced Practice Midwife

## 2021-01-24 ENCOUNTER — Encounter: Payer: Self-pay | Admitting: Obstetrics and Gynecology

## 2021-01-24 LAB — CBC
HCT: 38.6 % (ref 36.0–46.0)
Hemoglobin: 12.8 g/dL (ref 12.0–15.0)
MCH: 30.6 pg (ref 26.0–34.0)
MCHC: 33.2 g/dL (ref 30.0–36.0)
MCV: 92.3 fL (ref 80.0–100.0)
Platelets: 298 10*3/uL (ref 150–400)
RBC: 4.18 MIL/uL (ref 3.87–5.11)
RDW: 13.1 % (ref 11.5–15.5)
WBC: 15.5 10*3/uL — ABNORMAL HIGH (ref 4.0–10.5)
nRBC: 0 % (ref 0.0–0.2)

## 2021-01-24 MED ORDER — GABAPENTIN 100 MG PO CAPS: 200.0000 mg | ORAL_CAPSULE | Freq: Every day | ORAL | Status: DC

## 2021-01-24 MED ORDER — ONDANSETRON 4 MG PO TBDP: 4.0000 mg | ORAL_TABLET | Freq: Four times a day (QID) | ORAL | 0 refills | Status: DC | PRN

## 2021-01-24 MED ORDER — LACTATED RINGERS IV SOLN: INTRAVENOUS | Status: DC

## 2021-01-24 MED ORDER — IBUPROFEN 600 MG PO TABS
600.0000 mg | ORAL_TABLET | Freq: Four times a day (QID) | ORAL | Status: DC
  Administered 2021-01-24: 600 mg via ORAL
  Filled 2021-01-24: qty 1

## 2021-01-24 MED ORDER — ONDANSETRON 4 MG PO TBDP: 4.0000 mg | ORAL_TABLET | Freq: Four times a day (QID) | ORAL | Status: DC | PRN

## 2021-01-24 MED ORDER — ALBUTEROL SULFATE HFA 108 (90 BASE) MCG/ACT IN AERS
2.0000 | INHALATION_SPRAY | Freq: Four times a day (QID) | RESPIRATORY_TRACT | Status: DC | PRN
  Filled 2021-01-24: qty 6.7

## 2021-01-24 MED ORDER — HYDROCODONE-ACETAMINOPHEN 5-325 MG PO TABS: 1.0000 | ORAL_TABLET | ORAL | Status: DC | PRN

## 2021-01-24 MED ORDER — DOCUSATE SODIUM 100 MG PO CAPS: 100.0000 mg | ORAL_CAPSULE | Freq: Two times a day (BID) | ORAL | Status: DC

## 2021-01-24 MED ORDER — HYDROCODONE-ACETAMINOPHEN 5-325 MG PO TABS: 2.0000 | ORAL_TABLET | ORAL | Status: DC | PRN

## 2021-01-24 MED ORDER — OXYCODONE-ACETAMINOPHEN 5-325 MG PO TABS: 1.0000 | ORAL_TABLET | Freq: Four times a day (QID) | ORAL | 0 refills | Status: AC | PRN

## 2021-01-24 MED ORDER — MENTHOL 3 MG MT LOZG
1.0000 | LOZENGE | OROMUCOSAL | Status: DC | PRN
  Filled 2021-01-24: qty 9

## 2021-01-24 NOTE — Discharge Instructions (Signed)
AMBULATORY SURGERY  DISCHARGE INSTRUCTIONS   1) The drugs that you were given will stay in your system until tomorrow so for the next 24 hours you should not:  A) Drive an automobile B) Make any legal decisions C) Drink any alcoholic beverage   2) You may resume regular meals tomorrow.  Today it is better to start with liquids and gradually work up to solid foods.  You may eat anything you prefer, but it is better to start with liquids, then soup and crackers, and gradually work up to solid foods.   3) Please notify your doctor immediately if you have any unusual bleeding, trouble breathing, redness and pain at the surgery site, drainage, fever, or pain not relieved by medication.  4) Your post-operative visit with Dr.                                      Please call to schedule your post-operative visit.

## 2021-01-24 NOTE — Progress Notes (Signed)
Patient discharged home. Husband present at discharge. Discharge instructions and prescriptions given and reviewed with patient. Patient verbalized understanding.   Encouraged pt to call and schedule follow-up appointment with Dr. Glennon Mac in 2 weeks.    Escorted out by volunteers.

## 2021-01-24 NOTE — Progress Notes (Signed)
Norco 5/325 not available at pharmacy as ordered. Norco discontinued. Percocet 5/325 ordered as alternative. Spoke with pharmacist regarding Rx change.

## 2021-01-24 NOTE — Plan of Care (Signed)
Transferred to [place]. Alert and oriented with quiet affect. Color good, Skin w&d. BBS clear. Assessment and VS WNL. Oriented to room, POC and High Fall Prevention and Pt. V/o.

## 2021-01-24 NOTE — Progress Notes (Signed)
Pt. Has voided 1100 cc since arrival to Unit. She has tolerated a Ginger Ale and [place]. IVF Saline Locked as per standing order. Afeb. VSS.

## 2021-01-24 NOTE — Anesthesia Postprocedure Evaluation (Signed)
Anesthesia Post Note  Patient: Therapist, music  Procedure(s) Performed: DIAGNOSTIC LAPAROSCOPY WITH REMOVAL OF ECTOPIC PREGNANCY (Left )  Patient location during evaluation: PACU Anesthesia Type: General Level of consciousness: awake and alert Pain management: pain level controlled Vital Signs Assessment: post-procedure vital signs reviewed and stable Respiratory status: spontaneous breathing and respiratory function stable Cardiovascular status: stable Anesthetic complications: no   No complications documented.   Last Vitals:  Vitals:   01/24/21 0045 01/24/21 0057  BP: 132/74 125/67  Pulse: (!) 103 73  Resp: (!) 29 16  Temp:  37.1 C  SpO2: (!) 86% 94%    Last Pain:  Vitals:   01/24/21 0057  TempSrc:   PainSc: Asleep                 KEPHART,WILLIAM K

## 2021-01-24 NOTE — Discharge Summary (Signed)
DC Summary Discharge Summary   Patient ID: Mary Richards [id number] 35 y.o. 26-May-1986  Admit date: 01/23/2021  Discharge date: 01/24/2021  Principal Diagnoses:  Ruptured left ectopic pregnancy with hemoperitoneum  Secondary Diagnoses:  none  Procedures performed during the hospitalization:  Laparoscopic left salpingectomy and removal of ectopic pregnancy, evacuation of hemoperitoneum  History of Present Illness:  Mary Richards is a 35 y.o. G40P1011 premenopausal female seen at the request of Dr. Ellender Hose for evaluation of left ectopic pregnancy.    The patient had acute onset left lower quadrant pain that started at about 230PM today.  The pain is severe, doesn't radiate. Alleviating factors: sitting in a certain position makes it feel a little better.  Aggravating factors: movement.  Associated symptoms: none.  Denies fevers, chills, nausea, vomiting, significant vaginal bleeding.    Notably she has been diagnosed with a left ectopic pregnancy.  She has been treated with two doses of MTX. Her last dose was on 01/17/2021.    Her hCG levels have been as below: 01/11/21 (at 1233p): 2,547 01/11/21 (at 1744): 3,604 (day 1, first shot) 01/14/21: 6,983 (day 4, first shot) 01/17/21: 7,958 (day 7, first shot: day 1, second shot) 01/20/21: 4,851 (day 4, second shot) 01/23/21: 5,393 (day 7, second shot)  First dose of MTX: 01/11/2021 Second dose: 01/17/2021  Past Medical History:  Diagnosis Date  . Asthma   . Chronic migraine   . Complication of anesthesia   . DDD (degenerative disc disease), lumbosacral   . Family history of adverse reaction to anesthesia    Mother and Father - PONV  . PONV (postoperative nausea and vomiting)   . Wears contact lenses     Past Surgical History:  Procedure Laterality Date  . CHOLECYSTECTOMY    . DIAGNOSTIC LAPAROSCOPY WITH REMOVAL OF ECTOPIC PREGNANCY Left 01/23/2021   Procedure: DIAGNOSTIC LAPAROSCOPY WITH REMOVAL OF ECTOPIC PREGNANCY;  Surgeon: Will Bonnet, MD;  Location: [facility];  Service: Gynecology;  Laterality: Left;  . PILONIDAL CYST EXCISION    . TONSILECTOMY, ADENOIDECTOMY, BILATERAL MYRINGOTOMY AND TUBES    . TYMPANOPLASTY Right 05/10/2019   Procedure: TYMPANOPLASTY;  Surgeon: Beverly Gust, MD;  Location: [facility];  Service: ENT;  Laterality: Right;    Allergies  Allergen Reactions  . Azithromycin Hives, Shortness Of Breath and Swelling    Throat swelling  . Sulfa Antibiotics Hives  . Ciprofloxacin Swelling  . Moxifloxacin Hcl Hives  . Bactrim [Sulfamethoxazole-Trimethoprim] Rash    Social History   Tobacco Use  . Smoking status: Never Smoker  . Smokeless tobacco: Never Used  Vaping Use  . Vaping Use: Never used  Substance Use Topics  . Alcohol use: Yes    Alcohol/week: 2.0 standard drinks    Types: 2 Standard drinks or equivalent per week    Comment: Social   . Drug use: Never    Family History  Problem Relation Age of Onset  . Brain cancer Father 76  . Valvular heart disease Sister   . Brain cancer Paternal Grandmother 109  . COPD Maternal Grandfather   . Irritable bowel syndrome Mother   . Thyroid cancer Neg Hx   . Colon cancer Neg Hx     Hospital Course:  The patient was admitted for surgery, which occurred without complication.  The initial plan was to discharge the patient after surgery. However, she was feeling weak and in pain.  She was monitored overnight and only took ibuprofen with good pain control.  She has tolerated  a small amount of PO. She is ambulating. She is voiding spontaneously.  Her vitals are normal. She is considered stable for discharge.   Discharge Exam: BP 112/63 (BP Location: Right Arm)   Pulse 63   Temp 98.6 F (37 C) (Oral)   Resp 18   Ht 5\' 6"  (1.676 m)   Wt 90.7 kg   LMP 12/19/2020   SpO2 98%   Breastfeeding Unknown Comment: recent ectopic pregnancy   BMI 32.28 kg/m  Physical Exam Constitutional:      General: She is not in acute distress.     Appearance: Normal appearance. She is well-developed.  HENT:     Head: Normocephalic and atraumatic.  Eyes:     General: No scleral icterus.    Conjunctiva/sclera: Conjunctivae normal.  Cardiovascular:     Rate and Rhythm: Normal rate and regular rhythm.     Heart sounds: No murmur heard. No friction rub. No gallop.   Pulmonary:     Effort: Pulmonary effort is normal. No respiratory distress.     Breath sounds: Normal breath sounds. No wheezing or rales.  Abdominal:     General: Bowel sounds are normal. There is no distension.     Palpations: Abdomen is soft. There is no mass.     Tenderness: There is no abdominal tenderness. There is no guarding or rebound.     Comments: Incisions are clean, dry, and intact with surgical glue in place.   Musculoskeletal:        General: Normal range of motion.     Cervical back: Normal range of motion and neck supple.  Neurological:     General: No focal deficit present.     Mental Status: She is alert and oriented to person, place, and time.     Cranial Nerves: No cranial nerve deficit.  Skin:    General: Skin is warm and dry.     Findings: No erythema.  Psychiatric:        Mood and Affect: Mood normal.        Behavior: Behavior normal.        Judgment: Judgment normal.      Condition at Discharge: Stable  Complications affecting treatment: None  Discharge Medications:  Allergies as of 01/24/2021      Reactions   Azithromycin Hives, Shortness Of Breath, Swelling   Throat swelling   Sulfa Antibiotics Hives   Ciprofloxacin Swelling   Moxifloxacin Hcl Hives   Bactrim [sulfamethoxazole-trimethoprim] Rash      Medication List    STOP taking these medications   fexofenadine 180 MG tablet Commonly known as: ALLEGRA   VITAMIN D PO     TAKE these medications   albuterol 108 (90 Base) MCG/ACT inhaler Commonly known as: VENTOLIN HFA Inhale 2 puffs into the lungs every 6 (six) hours as needed for wheezing or shortness of breath.    azelastine 0.1 % nasal spray Commonly known as: ASTELIN Place 1 spray into both nostrils 2 (two) times daily. Use in each nostril as directed   gabapentin 100 MG capsule Commonly known as: NEURONTIN Take 2 capsules (200 mg total) by mouth at bedtime.   HYDROcodone-acetaminophen 5-325 MG tablet Commonly known as: NORCO/VICODIN Take 1 tablet by mouth every 6 (six) hours as needed (breakthrough pain).   ibuprofen 600 MG tablet Commonly known as: ADVIL Take 1 tablet (600 mg total) by mouth every 6 (six) hours.   Multi-Vitamin tablet Take by mouth.   ondansetron 4 MG disintegrating tablet Commonly  known as: ZOFRAN-ODT Take 1 tablet (4 mg total) by mouth every 6 (six) hours as needed for nausea or vomiting.   PROBIOTIC ADVANCED PO Take 1 capsule by mouth daily.   promethazine 25 MG tablet Commonly known as: PHENERGAN Take 1 tablet (25 mg total) by mouth every 6 (six) hours as needed for nausea or vomiting.   saccharomyces boulardii 250 MG capsule Commonly known as: Florastor Take 1 capsule (250 mg total) by mouth 2 (two) times daily.   traZODone 50 MG tablet Commonly known as: DESYREL TAKE 1/2 TO 1 TABLET BY MOUTH AT BEDTIME AS NEEDED FOR SLEEP.        Follow-up arrangements:   Follow-up Information    Will Bonnet, MD. Schedule an appointment as soon as possible for a visit in 2 weeks.   Specialty: Obstetrics and Gynecology Why: Post-op follow up Contact information: [address] [place] [address] [phone]              Discharge disposition: 01-Home or Self Care    Signed: Prentice Docker, MD  01/24/2021 8:35 AM

## 2021-01-26 LAB — SURGICAL PATHOLOGY

## 2021-01-27 ENCOUNTER — Telehealth: Payer: Self-pay

## 2021-01-27 NOTE — Telephone Encounter (Signed)
Pt left msg on triage asking if she can use menstrual cups. Her discharge summary says no tampons/sexual activity until next period. MMF advised nothing vaginal until her next period, pt aware.

## 2021-01-27 NOTE — Telephone Encounter (Signed)
Pt would like an update on her FMLA papers, she was told they were faxed to our office.

## 2021-01-28 ENCOUNTER — Other Ambulatory Visit: Payer: Self-pay

## 2021-01-28 ENCOUNTER — Ambulatory Visit: Payer: No Typology Code available for payment source | Admitting: Obstetrics and Gynecology

## 2021-01-28 ENCOUNTER — Ambulatory Visit (INDEPENDENT_AMBULATORY_CARE_PROVIDER_SITE_OTHER): Payer: No Typology Code available for payment source | Admitting: Gastroenterology

## 2021-01-28 VITALS — BP 116/79 | HR 88 | Ht 67.0 in | Wt 192.0 lb

## 2021-01-28 DIAGNOSIS — R195 Other fecal abnormalities: Secondary | ICD-10-CM | POA: Diagnosis not present

## 2021-01-28 DIAGNOSIS — K9089 Other intestinal malabsorption: Secondary | ICD-10-CM

## 2021-01-28 MED ORDER — NA SULFATE-K SULFATE-MG SULF 17.5-3.13-1.6 GM/177ML PO SOLN: 1.0000 | Freq: Once | ORAL | 0 refills | Status: DC

## 2021-01-28 MED ORDER — CHOLESTYRAMINE 4 G PO PACK: 4.0000 g | PACK | Freq: Two times a day (BID) | ORAL | 2 refills | Status: DC

## 2021-01-28 NOTE — Addendum Note (Signed)
Addended by: Dorethea Clan on: 01/28/2021 03:20 PM   Modules accepted: Orders

## 2021-01-28 NOTE — Progress Notes (Signed)
Jonathon Bellows MD, MRCP(U.K) [address]  [place]  [place],  [address]  Main: [phone]  Fax: [phone]   Gastroenterology Consultation  Referring Provider:     Burnard Hawthorne, FNP Primary Care Physician:  Burnard Hawthorne, FNP Primary Gastroenterologist:  Dr. Jonathon Bellows  Reason for Consultation:     Diarrhea        HPI:   Mary Richards is a 35 y.o. y/o female referred for diarrhea.   01/24/2021: CBC: Hemoglobin 12.8 g.  She states that she has had diarrhea right after meals since 2016 after she had a cholecystectomy.  No blood in the stool.  No NSAID use.  No family history of IBD.  Mother had colon polyps in the 77s.  She has never had a colonoscopy.  She has not tried a bile acid sequestrant in the past.  Some weight loss after starting on Topamax which she has stopped.  In February 2022 she had a stool test positive for blood in the stool and fecal lactoferrin that was negative.  GI PCR and testing for C. difficile were negative.  Past Medical History:  Diagnosis Date  . Asthma   . Chronic migraine   . Complication of anesthesia   . DDD (degenerative disc disease), lumbosacral   . Family history of adverse reaction to anesthesia    Mother and Father - PONV  . PONV (postoperative nausea and vomiting)   . Wears contact lenses     Past Surgical History:  Procedure Laterality Date  . CHOLECYSTECTOMY    . DIAGNOSTIC LAPAROSCOPY WITH REMOVAL OF ECTOPIC PREGNANCY Left 01/23/2021   Procedure: DIAGNOSTIC LAPAROSCOPY WITH REMOVAL OF ECTOPIC PREGNANCY;  Surgeon: Will Bonnet, MD;  Location: [facility];  Service: Gynecology;  Laterality: Left;  . PILONIDAL CYST EXCISION    . TONSILECTOMY, ADENOIDECTOMY, BILATERAL MYRINGOTOMY AND TUBES    . TYMPANOPLASTY Right 05/10/2019   Procedure: TYMPANOPLASTY;  Surgeon: Beverly Gust, MD;  Location: [place];  Service: ENT;  Laterality: Right;    Prior to Admission medications   Medication  Sig Start Date End Date Taking? Authorizing Provider  albuterol (VENTOLIN HFA) 108 (90 Base) MCG/ACT inhaler Inhale 2 puffs into the lungs every 6 (six) hours as needed for wheezing or shortness of breath. 01/12/21   Burnard Hawthorne, FNP  azelastine (ASTELIN) 0.1 % nasal spray Place 1 spray into both nostrils 2 (two) times daily. Use in each nostril as directed 11/01/18   Darlin Priestly, PA-C  gabapentin (NEURONTIN) 100 MG capsule Take 2 capsules (200 mg total) by mouth at bedtime. 10/28/20   Burnard Hawthorne, FNP  ibuprofen (ADVIL) 600 MG tablet Take 1 tablet (600 mg total) by mouth every 6 (six) hours. 01/23/21   Will Bonnet, MD  Multiple Vitamin (MULTI-VITAMIN) tablet Take by mouth.    [provider]  ondansetron (ZOFRAN-ODT) 4 MG disintegrating tablet Take 1 tablet (4 mg total) by mouth every 6 (six) hours as needed for nausea or vomiting. 01/24/21   Will Bonnet, MD  oxyCODONE-acetaminophen (PERCOCET/ROXICET) 5-325 MG tablet Take 1 tablet by mouth every 6 (six) hours as needed for up to 5 days. 01/24/21 01/29/21  Rod Can, CNM  Probiotic Product (PROBIOTIC ADVANCED PO) Take 1 capsule by mouth daily.    [provider]  promethazine (PHENERGAN) 25 MG tablet Take 1 tablet (25 mg total) by mouth every 6 (six) hours as needed for nausea or vomiting. 01/14/21   Schuman, Christanna R,  MD  saccharomyces boulardii (FLORASTOR) 250 MG capsule Take 1 capsule (250 mg total) by mouth 2 (two) times daily. 01/11/21   Burnard Hawthorne, FNP  traZODone (DESYREL) 50 MG tablet TAKE 1/2 TO 1 TABLET BY MOUTH AT BEDTIME AS NEEDED FOR SLEEP. 06/03/20   Burnard Hawthorne, FNP    Family History  Problem Relation Age of Onset  . Brain cancer Father 50  . Valvular heart disease Sister   . Brain cancer Paternal Grandmother 23  . COPD Maternal Grandfather   . Irritable bowel syndrome Mother   . Thyroid cancer Neg Hx   . Colon cancer Neg Hx      Social History   Tobacco Use  .  Smoking status: Never Smoker  . Smokeless tobacco: Never Used  Vaping Use  . Vaping Use: Never used  Substance Use Topics  . Alcohol use: Yes    Alcohol/week: 2.0 standard drinks    Types: 2 Standard drinks or equivalent per week    Comment: Social   . Drug use: Never    Allergies as of 01/28/2021 - Review Complete 01/23/2021  Allergen Reaction Noted  . Azithromycin Hives, Shortness Of Breath, and Swelling 11/01/2018  . Sulfa antibiotics Hives 12/31/2019  . Ciprofloxacin Swelling 11/01/2018  . Moxifloxacin hcl Hives 11/01/2018  . Bactrim [sulfamethoxazole-trimethoprim] Rash 11/01/2018    Review of Systems:    All systems reviewed and negative except where noted in HPI.   Physical Exam:  There were no vitals taken for this visit. No LMP recorded. Psych:  Alert and cooperative. Normal mood and affect. General:   Alert,  Well-developed, well-nourished, pleasant and cooperative in NAD Head:  Normocephalic and atraumatic. Eyes:  Sclera clear, no icterus.   Conjunctiva pink. Ears:  Normal auditory acuity.. Lungs:  Respirations even and unlabored.  Clear throughout to auscultation.   No wheezes, crackles, or rhonchi. No acute distress. Heart:  Regular rate and rhythm; no murmurs, clicks, rubs, or gallops. Abdomen:  Normal bowel sounds.  No bruits.  Soft, non-tender and non-distended without masses, hepatosplenomegaly or hernias noted.  No guarding or rebound tenderness.    Neurologic:  Alert and oriented x3;  grossly normal neurologically. Psych:  Alert and cooperative. Normal mood and affect.  Imaging Studies: US OB LESS THAN 14 WEEKS WITH OB TRANSVAGINAL  Result Date: 01/23/2021 CLINICAL DATA:  Known left adnexal ectopic pregnancy, status post methotrexate last Dose taken 5 days ago, sudden onset of pain 2 hours ago EXAM: OBSTETRIC <14 WK [place] AND TRANSVAGINAL OB US TECHNIQUE: Both transabdominal and transvaginal ultrasound examinations were performed for complete evaluation of the  gestation as well as the maternal uterus, adnexal regions, and pelvic cul-de-sac. Transvaginal technique was performed to assess early pregnancy. COMPARISON:  01/11/2021 FINDINGS: Intrauterine gestational sac: None Yolk sac:  Not Visualized. Embryo:  Not Visualized. Cardiac Activity: Not Visualized. Maternal uterus/adnexae: The uterus is anteverted measuring 9.5 x 4.8 x 5.2 cm. No uterine masses. Endometrium is slightly thickened measuring 11 mm. Within the left adnexa there is a complex cystic structure measuring 2.5 x 1.8 x 2.8 cm, likely the residual of the ectopic pregnancy seen previously. The well-formed yolk sac seen on prior exam is not identified on today's study. A fetal pole cannot be clearly visualized, there is no documented cardiac activity within this mass. Echogenic material is seen surrounding this left adnexal cystic structure, concerning for hematoma and ruptured ectopic pregnancy. Increased free fluid throughout the pelvis, which is mildly complex with internal echoes identified,  consistent with hemoperitoneum. The right ovary is not well visualized. IMPRESSION: 1. Interval rupture of the ectopic pregnancy within the left adnexa, with interval development of complex fluid throughout the pelvis compatible with hemoperitoneum. Critical Value/emergent results were called by telephone at the time of interpretation on 01/23/2021 at 6:37 pm to provider Duffy Bruce , who verbally acknowledged these results. Electronically Signed   By: Randa Ngo M.D.   On: 01/23/2021 18:41   US OB LESS THAN 14 WEEKS WITH OB TRANSVAGINAL  Result Date: 01/11/2021 CLINICAL DATA:  Spotting. By LMP patient is 3 weeks 2 days. Quantitative beta HCG is pending. EXAM: OBSTETRIC <14 WK [place] AND TRANSVAGINAL OB US TECHNIQUE: Both transabdominal and transvaginal ultrasound examinations were performed for complete evaluation of the gestation as well as the maternal uterus, adnexal regions, and pelvic cul-de-sac. Transvaginal  technique was performed to assess early pregnancy. COMPARISON:  None. FINDINGS: Intrauterine gestational sac: None Yolk sac:  Visualized. Embryo:  Visualized. Cardiac Activity: Visualized. Subchorionic hemorrhage:  None visualized. Maternal uterus/adnexae: The RIGHT ovary is normal in appearance, 2.8 x 2.0 x 2.2 centimeters. LEFT ovary is enlarged and measures 5.1 x 3.2 x 4.0 centimeters. A complex LEFT ovarian cyst is 3.6 x 3.2 x 3.3 centimeters and consistent with a corpus luteum cyst. Interposed between the LEFT ovarian cyst and the uterus, there is a complex LEFT adnexal mass containing yolk sac. The periphery of the mass is vascular on Doppler evaluation and measures 1.2 x 1.6 x 1.6 centimeters. IMPRESSION: 1. Findings are consistent with LEFT adnexal ectopic pregnancy. 2. Small to moderate free fluid. 3. No intrauterine pregnancy. Critical Value/emergent results were called by telephone at the time of interpretation on 01/11/2021 at 3:23 pm to provider [facility] , who verbally acknowledged these results. Electronically Signed   By: Nolon Nations M.D.   On: 01/11/2021 15:29    Assessment and Plan:   Malayah Demuro is a 35 y.o. y/o female has been referred for diarrhea.  Her history is very suggestive of bile salt diarrhea which is usually not uncommon after undergoing a cholecystectomy.  Unclear reason for checking stool occult for blood which is positive which could be from multiple reasons ranging from eating a steak or hemorrhoids as well as neoplasm.  Since the test is positive would have no option but to evaluate further with a colonoscopy.  I have explained to her not to undergo any future testing of stool for blood as it is a test only meant for colon cancer screening and has no other utility elsewhere. I will commence her on Questran 1-2 sachets a day to see if it improves her bile salt mediated diarrhea.  I have discussed alternative options, risks & benefits,  which include,  but are not limited to, bleeding, infection, perforation,respiratory complication & drug reaction.  The patient agrees with this plan & written consent will be obtained.     Follow up in 3 months video visit  Dr Jonathon Bellows MD,MRCP(U.K)

## 2021-01-29 ENCOUNTER — Telehealth: Payer: Self-pay

## 2021-01-29 NOTE — Telephone Encounter (Signed)
Pt awre via vm that we received papers, however i have not had a chance to complete them. I am FMLA next Wednesday . Will work on then.

## 2021-01-29 NOTE — Telephone Encounter (Signed)
FMLA/DISABILITY form for [employer] filled out, signature obtained and give to [facility] for processing.

## 2021-02-05 ENCOUNTER — Encounter (INDEPENDENT_AMBULATORY_CARE_PROVIDER_SITE_OTHER): Payer: No Typology Code available for payment source | Admitting: Vascular Surgery

## 2021-02-09 ENCOUNTER — Other Ambulatory Visit: Payer: Self-pay

## 2021-02-09 ENCOUNTER — Encounter: Payer: Self-pay | Admitting: Obstetrics and Gynecology

## 2021-02-09 ENCOUNTER — Ambulatory Visit (INDEPENDENT_AMBULATORY_CARE_PROVIDER_SITE_OTHER): Payer: No Typology Code available for payment source | Admitting: Obstetrics and Gynecology

## 2021-02-09 VITALS — BP 126/74 | Wt 196.0 lb

## 2021-02-09 DIAGNOSIS — Z09 Encounter for follow-up examination after completed treatment for conditions other than malignant neoplasm: Secondary | ICD-10-CM

## 2021-02-09 NOTE — Progress Notes (Signed)
   Postoperative Follow-up Patient presents post op from laparoscopic left salpingectomy 2 weeks ago for ruptured left tubal ectopic pregnancy.  Subjective: Patient reports marked improvement in her preop symptoms. Eating a regular diet without difficulty. The patient is not having any pain.  Activity: normal activities of daily living.  She denies fever, chills, nausea and vomiting.   She had a mild reaction to what sounds like the Dermabond.  Doing much better now. She has some bladder spasms at the cessation of voiding that are short lived.   SURGICAL PATHOLOGY  CASE: [id number]  PATIENT: Mary Richards  Surgical Pathology Report   Specimen Submitted:  A. Fallopian tube, left   Clinical History: Ruptured ectopic   DIAGNOSIS:  A. FALLOPIAN TUBE, LEFT; SALPINGECTOMY:  - ECTOPIC PREGNANCY.   Comment:  The fallopian tube contains chorionic villi with hydropic degeneration,  degenerating implantation site trophoblast, and abundant blood clot.   Objective: Vital Signs: BP 126/74   Wt 196 lb (88.9 kg)   BMI 30.70 kg/m  Physical Exam Constitutional:      General: She is not in acute distress.    Appearance: Normal appearance.  HENT:     Head: Normocephalic and atraumatic.  Eyes:     General: No scleral icterus.    Conjunctiva/sclera: Conjunctivae normal.  Abdominal:     General: Bowel sounds are normal. There is no distension.     Palpations: Abdomen is soft. There is no mass.     Tenderness: There is no abdominal tenderness. There is no guarding or rebound.     Comments: Incisions clean, dry, and intact  Neurological:     General: No focal deficit present.     Mental Status: She is alert and oriented to person, place, and time.     Cranial Nerves: No cranial nerve deficit.  Psychiatric:        Mood and Affect: Mood normal.        Behavior: Behavior normal.        Judgment: Judgment normal.      Assessment: 35 y.o. s/p above surgery progressing  well  Plan: Patient has done well after surgery with no apparent complications.  I have discussed the post-operative course to date, and the expected progress moving forward.  The patient understands what complications to be concerned about.  I will see the patient in routine follow up, or sooner if needed.    Activity plan: No restriction.  Prentice Docker, MD  02/09/2021, 11:04 AM

## 2021-02-17 ENCOUNTER — Other Ambulatory Visit
Admission: RE | Admit: 2021-02-17 | Discharge: 2021-02-17 | Disposition: A | Discharge status: Home / Self Care | Payer: No Typology Code available for payment source | Source: Ambulatory Visit | Attending: Gastroenterology | Admitting: Gastroenterology

## 2021-02-17 ENCOUNTER — Other Ambulatory Visit: Payer: Self-pay

## 2021-02-17 DIAGNOSIS — Z20822 Contact with and (suspected) exposure to covid-19: Secondary | ICD-10-CM | POA: Insufficient documentation

## 2021-02-17 DIAGNOSIS — Z01812 Encounter for preprocedural laboratory examination: Secondary | ICD-10-CM | POA: Diagnosis not present

## 2021-02-17 LAB — SARS CORONAVIRUS 2 (TAT 6-24 HRS): SARS Coronavirus 2: NEGATIVE

## 2021-02-18 ENCOUNTER — Ambulatory Visit: Payer: No Typology Code available for payment source | Admitting: Gastroenterology

## 2021-02-19 ENCOUNTER — Encounter
Admission: RE | Disposition: A | Discharge status: Home / Self Care | Payer: Self-pay | Source: Home / Self Care | Attending: Gastroenterology

## 2021-02-19 ENCOUNTER — Ambulatory Visit: Payer: No Typology Code available for payment source | Admitting: Certified Registered Nurse Anesthetist

## 2021-02-19 ENCOUNTER — Ambulatory Visit
Admission: RE | Admit: 2021-02-19 | Discharge: 2021-02-19 | Disposition: A | Discharge status: Home / Self Care | Payer: No Typology Code available for payment source | Attending: Gastroenterology | Admitting: Gastroenterology

## 2021-02-19 ENCOUNTER — Other Ambulatory Visit: Payer: Self-pay

## 2021-02-19 DIAGNOSIS — Z79899 Other long term (current) drug therapy: Secondary | ICD-10-CM | POA: Diagnosis not present

## 2021-02-19 DIAGNOSIS — Z8379 Family history of other diseases of the digestive system: Secondary | ICD-10-CM | POA: Insufficient documentation

## 2021-02-19 DIAGNOSIS — Z8759 Personal history of other complications of pregnancy, childbirth and the puerperium: Secondary | ICD-10-CM | POA: Diagnosis not present

## 2021-02-19 DIAGNOSIS — K635 Polyp of colon: Secondary | ICD-10-CM | POA: Diagnosis not present

## 2021-02-19 DIAGNOSIS — Z882 Allergy status to sulfonamides status: Secondary | ICD-10-CM | POA: Diagnosis not present

## 2021-02-19 DIAGNOSIS — Z9049 Acquired absence of other specified parts of digestive tract: Secondary | ICD-10-CM | POA: Insufficient documentation

## 2021-02-19 DIAGNOSIS — R195 Other fecal abnormalities: Secondary | ICD-10-CM | POA: Insufficient documentation

## 2021-02-19 DIAGNOSIS — D122 Benign neoplasm of ascending colon: Secondary | ICD-10-CM | POA: Diagnosis not present

## 2021-02-19 DIAGNOSIS — Z881 Allergy status to other antibiotic agents status: Secondary | ICD-10-CM | POA: Diagnosis not present

## 2021-02-19 DIAGNOSIS — Z888 Allergy status to other drugs, medicaments and biological substances status: Secondary | ICD-10-CM | POA: Insufficient documentation

## 2021-02-19 HISTORY — PX: COLONOSCOPY WITH PROPOFOL: SHX5780

## 2021-02-19 LAB — POCT PREGNANCY, URINE: Preg Test, Ur: NEGATIVE

## 2021-02-19 SURGERY — COLONOSCOPY WITH PROPOFOL
Anesthesia: General

## 2021-02-19 MED ORDER — ONDANSETRON HCL 4 MG/2ML IJ SOLN
INTRAMUSCULAR | Status: DC | PRN
  Administered 2021-02-19: 4 mg via INTRAVENOUS

## 2021-02-19 MED ORDER — PROPOFOL 10 MG/ML IV BOLUS
INTRAVENOUS | Status: DC | PRN
  Administered 2021-02-19: 30 mg via INTRAVENOUS
  Administered 2021-02-19: 10 mg via INTRAVENOUS
  Administered 2021-02-19: 90 mg via INTRAVENOUS

## 2021-02-19 MED ORDER — ONDANSETRON HCL 4 MG/2ML IJ SOLN
INTRAMUSCULAR | Status: AC
  Filled 2021-02-19: qty 2

## 2021-02-19 MED ORDER — PROPOFOL 500 MG/50ML IV EMUL
INTRAVENOUS | Status: DC | PRN
  Administered 2021-02-19: 200 ug/kg/min via INTRAVENOUS

## 2021-02-19 MED ORDER — SODIUM CHLORIDE 0.9 % IV SOLN
INTRAVENOUS | Status: DC
  Administered 2021-02-19: 20 mL/h via INTRAVENOUS

## 2021-02-19 NOTE — Anesthesia Procedure Notes (Signed)
Performed by: Demetrius Charity, CRNA Patient Re-evaluated:Patient Re-evaluated prior to induction Oxygen Delivery Method: Nasal cannula Placement Confirmation: positive ETCO2 and CO2 detector

## 2021-02-19 NOTE — Anesthesia Preprocedure Evaluation (Signed)
Anesthesia Evaluation  Patient identified by MRN, date of birth, ID band Patient awake    Reviewed: Allergy & Precautions, NPO status , Patient's Chart, lab work & pertinent test results  History of Anesthesia Complications (+) PONV and history of anesthetic complications  Airway Mallampati: II  TM Distance: >3 FB Neck ROM: Full    Dental no notable dental hx.    Pulmonary asthma ,    breath sounds clear to auscultation- rhonchi (-) wheezing      Cardiovascular Exercise Tolerance: Good (-) hypertension(-) CAD, (-) Past MI, (-) Cardiac Stents and (-) CABG  Rhythm:Regular Rate:Normal - Systolic murmurs and - Diastolic murmurs    Neuro/Psych  Headaches, neg Seizures PSYCHIATRIC DISORDERS Anxiety    GI/Hepatic negative GI ROS, Neg liver ROS,   Endo/Other  negative endocrine ROSneg diabetes  Renal/GU negative Renal ROS     Musculoskeletal  (+) Arthritis ,   Abdominal (+) + obese,   Peds  Hematology negative hematology ROS (+)   Anesthesia Other Findings Past Medical History: No date: Asthma No date: Chronic migraine No date: Complication of anesthesia No date: DDD (degenerative disc disease), lumbosacral No date: Family history of adverse reaction to anesthesia     Comment:  Mother and Father - PONV No date: PONV (postoperative nausea and vomiting) No date: Wears contact lenses   Reproductive/Obstetrics                             Anesthesia Physical Anesthesia Plan  ASA: II  Anesthesia Plan: General   Post-op Pain Management:    Induction: Intravenous  PONV Risk Score and Plan: 3 and Propofol infusion  Airway Management Planned: Natural Airway  Additional Equipment:   Intra-op Plan:   Post-operative Plan:   Informed Consent: I have reviewed the patients History and Physical, chart, labs and discussed the procedure including the risks, benefits and alternatives for the  proposed anesthesia with the patient or authorized representative who has indicated his/her understanding and acceptance.     Dental advisory given  Plan Discussed with: CRNA and Anesthesiologist  Anesthesia Plan Comments:         Anesthesia Quick Evaluation

## 2021-02-19 NOTE — Anesthesia Postprocedure Evaluation (Signed)
Anesthesia Post Note  Patient: Mary Richards  Procedure(s) Performed: COLONOSCOPY WITH PROPOFOL (N/A )  Patient location during evaluation: Endoscopy Anesthesia Type: General Level of consciousness: awake and alert and oriented Pain management: pain level controlled Vital Signs Assessment: post-procedure vital signs reviewed and stable Respiratory status: spontaneous breathing, nonlabored ventilation and respiratory function stable Cardiovascular status: blood pressure returned to baseline and stable Postop Assessment: no signs of nausea or vomiting Anesthetic complications: no   No complications documented.   Last Vitals:  Vitals:   02/19/21 0954 02/19/21 1004  BP: 124/68 119/62  Pulse: 79 66  Resp: 18 17  Temp:    SpO2: 100% 100%    Last Pain:  Vitals:   02/19/21 1004  TempSrc:   PainSc: 0-No pain                 Tyleek Smick

## 2021-02-19 NOTE — H&P (Signed)
Jonathon Bellows, MD [address], [place], [place], [place], [address] [address], [place], [place], [place], [address] Phone: [phone]  Fax: [phone]  Primary Care Physician:  Burnard Hawthorne, FNP   Pre-Procedure History & Physical: HPI:  Mary Richards is a 35 y.o. female is here for an colonoscopy.   Past Medical History:  Diagnosis Date  . Asthma   . Chronic migraine   . Complication of anesthesia   . DDD (degenerative disc disease), lumbosacral   . Family history of adverse reaction to anesthesia    Mother and Father - PONV  . PONV (postoperative nausea and vomiting)   . Wears contact lenses     Past Surgical History:  Procedure Laterality Date  . CHOLECYSTECTOMY    . DIAGNOSTIC LAPAROSCOPY WITH REMOVAL OF ECTOPIC PREGNANCY Left 01/23/2021   Procedure: DIAGNOSTIC LAPAROSCOPY WITH REMOVAL OF ECTOPIC PREGNANCY;  Surgeon: Will Bonnet, MD;  Location: [facility];  Service: Gynecology;  Laterality: Left;  . PILONIDAL CYST EXCISION    . TONSILECTOMY, ADENOIDECTOMY, BILATERAL MYRINGOTOMY AND TUBES    . TYMPANOPLASTY Right 05/10/2019   Procedure: TYMPANOPLASTY;  Surgeon: Beverly Gust, MD;  Location: [place];  Service: ENT;  Laterality: Right;    Prior to Admission medications   Medication Sig Start Date End Date Taking? Authorizing Provider  albuterol (VENTOLIN HFA) 108 (90 Base) MCG/ACT inhaler INHALE 2 PUFFS INTO THE LUNGS EVERY 6 HOURS AS NEEDED FOR WHEEZING OR SHORTNESS OF BREATH. 01/12/21 01/12/22 Yes Arnett, Yvetta Coder, FNP  azelastine (ASTELIN) 0.1 % nasal spray Place 1 spray into both nostrils 2 (two) times daily. Use in each nostril as directed 11/01/18  Yes Darlin Priestly, PA-C  cholestyramine (QUESTRAN) 4 g packet MIX 1 PACKET AS DIRECTED AND TAKE 2 TIMES DAILY. 01/28/21 01/28/22 Yes Jonathon Bellows, MD  gabapentin (NEURONTIN) 100 MG capsule TAKE 2 CAPSULES BY MOUTH AT BEDTIME 10/28/20 10/28/21 Yes Arnett, Yvetta Coder, FNP  ibuprofen  (ADVIL) 600 MG tablet Take 1 tablet (600 mg total) by mouth every 6 (six) hours. 01/23/21  Yes Will Bonnet, MD  Multiple Vitamin (MULTI-VITAMIN) tablet Take by mouth.   Yes [provider]  ondansetron (ZOFRAN-ODT) 4 MG disintegrating tablet Take 1 tablet (4 mg total) by mouth every 6 (six) hours as needed for nausea or vomiting. 01/24/21  Yes Will Bonnet, MD  Probiotic Product (PROBIOTIC ADVANCED PO) Take 1 capsule by mouth daily.   Yes [provider]  promethazine (PHENERGAN) 25 MG tablet TAKE 1 TABLET BY MOUTH EVERY 6 HOURS AS NEEDED FOR NAUSEA OR VOMITING. 01/14/21 01/14/22 Yes Schuman, Stefanie Libel, MD  saccharomyces boulardii (FLORASTOR) 250 MG capsule Take 1 capsule (250 mg total) by mouth 2 (two) times daily. 01/11/21  Yes Burnard Hawthorne, FNP  Saccharomyces boulardii (PROBIOTIC) 250 MG CAPS TAKE 1 CAPSULE BY MOUTH 2 (TWO) TIMES DAILY. 01/11/21 01/11/22 Yes Burnard Hawthorne, FNP  traZODone (DESYREL) 50 MG tablet TAKE 1/2 TO 1 TABLET BY MOUTH AT BEDTIME AS NEEDED FOR SLEEP. 06/03/20  Yes Burnard Hawthorne, FNP  COVID-19 Ad26 vaccine, JANSSEN/J&J, 0.5 ML injection USE AS DIRECTED Patient not taking: Reported on 02/19/2021 09/08/20 09/08/21  Carlyle Basques, MD  topiramate (TOPAMAX) 50 MG tablet Take 1 tablet (50 mg total) by mouth 2 (two) times daily. Patient not taking: Reported on 01/11/2021 12/09/20 01/11/21  Burnard Hawthorne, FNP    Allergies as of 01/29/2021 - Review Complete 01/28/2021  Allergen Reaction Noted  . Azithromycin  Hives, Shortness Of Breath, and Swelling 11/01/2018  . Sulfa antibiotics Hives 12/31/2019  . Ciprofloxacin Swelling 11/01/2018  . Moxifloxacin hcl Hives 11/01/2018  . Bactrim [sulfamethoxazole-trimethoprim] Rash 11/01/2018    Family History  Problem Relation Age of Onset  . Brain cancer Father 71  . Valvular heart disease Sister   . Brain cancer Paternal Grandmother 30  . COPD Maternal Grandfather   . Irritable bowel syndrome  Mother   . Thyroid cancer Neg Hx   . Colon cancer Neg Hx     Social History   Socioeconomic History  . Marital status: Married    Spouse name: Not on file  . Number of children: Not on file  . Years of education: Not on file  . Highest education level: Not on file  Occupational History  . Not on file  Tobacco Use  . Smoking status: Never Smoker  . Smokeless tobacco: Never Used  Vaping Use  . Vaping Use: Never used  Substance and Sexual Activity  . Alcohol use: Yes    Alcohol/week: 2.0 standard drinks    Types: 2 Standard drinks or equivalent per week    Comment: Social   . Drug use: Never  . Sexual activity: Yes    Birth control/protection: Condom  Other Topics Concern  . Not on file  Social History Narrative   Works as Designer, multimedia in ICU   2 children   Social Determinants of Radio broadcast assistant Strain: Not on file  Food Insecurity: Not on file  Transportation Needs: Not on file  Physical Activity: Not on file  Stress: Not on file  Social Connections: Not on file  Intimate Partner Violence: Not on file    Review of Systems: See HPI, otherwise negative ROS  Physical Exam: BP 119/81   Pulse 81   Temp (!) 96 F (35.6 C) (Temporal)   Resp 20   Ht 5\' 6"  (1.676 m)   Wt 86.2 kg   SpO2 96%   BMI 30.67 kg/m  General:   Alert,  pleasant and cooperative in NAD Head:  Normocephalic and atraumatic. Neck:  Supple; no masses or thyromegaly. Lungs:  Clear throughout to auscultation, normal respiratory effort.    Heart:  +S1, +S2, Regular rate and rhythm, No edema. Abdomen:  Soft, nontender and nondistended. Normal bowel sounds, without guarding, and without rebound.   Neurologic:  Alert and  oriented x4;  grossly normal neurologically.  Impression/Plan: Mary Richards is here for an colonoscopy to be performed for stool occult test positive .  Risks, benefits, limitations, and alternatives regarding  colonoscopy have been reviewed with the patient.   Questions have been answered.  All parties agreeable.   Jonathon Bellows, MD  02/19/2021, 8:44 AM

## 2021-02-19 NOTE — Transfer of Care (Signed)
Immediate Anesthesia Transfer of Care Note  Patient: Mary Richards  Procedure(s) Performed: COLONOSCOPY WITH PROPOFOL (N/A )  Patient Location: PACU  Anesthesia Type:General  Level of Consciousness: awake and alert   Airway & Oxygen Therapy: Patient Spontanous Breathing  Post-op Assessment: Report given to RN and Post -op Vital signs reviewed and stable  Post vital signs: Reviewed and stable  Last Vitals:  Vitals Value Taken Time  BP 109/63 02/19/21 0944  Temp 35.7 C 02/19/21 0944  Pulse 78 02/19/21 0945  Resp 18 02/19/21 0945  SpO2 98 % 02/19/21 0945  Vitals shown include unvalidated device data.  Last Pain:  Vitals:   02/19/21 0944  TempSrc: Temporal  PainSc: 0-No pain         Complications: No complications documented.

## 2021-02-19 NOTE — Op Note (Signed)
[facility] Patient Name: Mary Richards Procedure Date: 02/19/2021 9:12 AM MRN: [id number] Account #: [id number] Date of Birth: 04-30-1986 Admit Type: Outpatient Age: 35 Room: [facility] ENDO ROOM 4 Gender: Female Note Status: Finalized Procedure:             Colonoscopy Indications:           Evaluation of unexplained GI bleeding presenting with                         fecal occult blood Providers:             Jonathon Bellows MD, MD Medicines:             Monitored Anesthesia Care Complications:         No immediate complications. Procedure:             Pre-Anesthesia Assessment:                        - Prior to the procedure, a History and Physical was                         performed, and patient medications, allergies and                         sensitivities were reviewed. The patient's tolerance                         of previous anesthesia was reviewed.                        - The risks and benefits of the procedure and the                         sedation options and risks were discussed with the                         patient. All questions were answered and informed                         consent was obtained.                        - ASA Grade Assessment: II - A patient with mild                         systemic disease.                        After obtaining informed consent, the colonoscope was                         passed under direct vision. Throughout the procedure,                         the patient's blood pressure, pulse, and oxygen                         saturations were monitored continuously. The  Colonoscope was introduced through the anus and                         advanced to the the cecum, identified by the                         appendiceal orifice. The colonoscopy was performed                         with ease. The patient tolerated the procedure well.                         The quality of the  bowel preparation was adequate to                         identify polyps 6 mm and larger in size. Findings:      The perianal and digital rectal examinations were normal.      Two sessile polyps were found in the ascending colon. The polyps were 4       to 6 mm in size. These polyps were removed with a cold snare. Resection       and retrieval were complete.      The exam was otherwise without abnormality on direct and retroflexion       views. Impression:            - Two 4 to 6 mm polyps in the ascending colon, removed                         with a cold snare. Resected and retrieved.                        - The examination was otherwise normal on direct and                         retroflexion views. Recommendation:        - Discharge patient to home (with escort).                        - Resume previous diet.                        - Continue present medications.                        - Await pathology results.                        - Repeat colonoscopy for surveillance based on                         pathology results. Procedure Code(s):     --- Professional ---                        [phone], Colonoscopy, flexible; with removal of                         tumor(s), polyp(s), or other lesion(s) by snare  technique Diagnosis Code(s):     --- Professional ---                        K63.5, Polyp of colon                        R19.5, Other fecal abnormalities CPT copyright 2019 American Medical Association. All rights reserved. The codes documented in this report are preliminary and upon coder review may  be revised to meet current compliance requirements. Jonathon Bellows, MD Jonathon Bellows MD, MD 02/19/2021 9:43:45 AM This report has been signed electronically. Number of Addenda: 0 Note Initiated On: 02/19/2021 9:12 AM Scope Withdrawal Time: 0 hours 12 minutes 5 seconds  Total Procedure Duration: 0 hours 14 minutes 28 seconds  Estimated Blood Loss:  Estimated  blood loss: none.      [facility]

## 2021-02-20 NOTE — Progress Notes (Signed)
Non-identified Voicemail.  No message left.

## 2021-02-22 ENCOUNTER — Encounter: Payer: Self-pay | Admitting: Gastroenterology

## 2021-02-22 LAB — SURGICAL PATHOLOGY

## 2021-03-02 IMAGING — MR MR KNEE*L* W/O CM
6 series · 40 of 40 positions shown · non-contrast
Comparison: None.

CLINICAL DATA: Persistent left knee pain since feeling a pop during
an injury on Halloween.

EXAM:
MRI OF THE LEFT KNEE WITHOUT CONTRAST
TECHNIQUE: Multiplanar, multisequence MR imaging of the knee was performed. No
intravenous contrast was administered.

[Series 8: T2 fat-sat · axial · left · 4.0mm · 0.50mm/px · z∈[-110,+14]mm · 6 of 26 slices shown (1 of 3)]
[im 1/26]
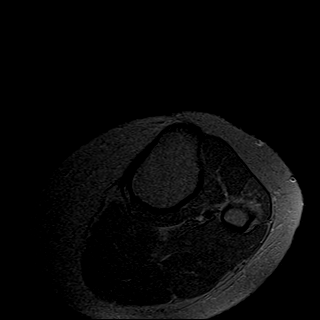
[im 6/26]
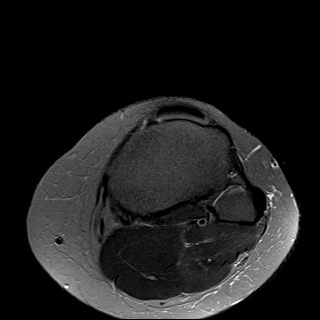
[im 11/26]
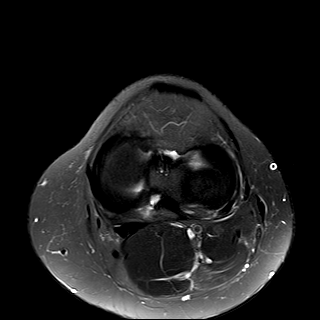
[im 16/26]
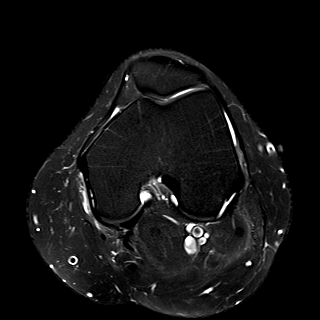
[im 21/26]
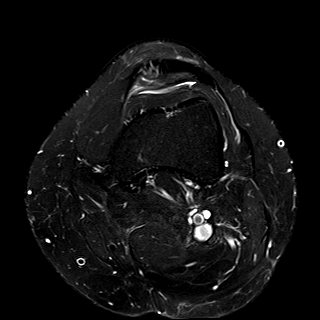
[im 26/26]
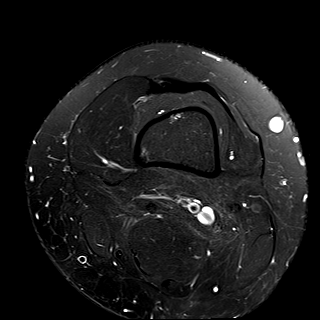

[Series 9: T2 fat-sat · coronal · left · 4.0mm · 0.59mm/px · 6 of 27 slices shown (2 of 3)]
[im 1/27]
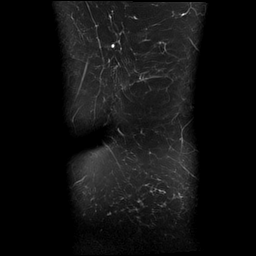
[im 6/27]
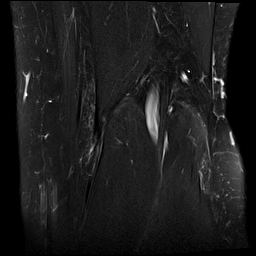
[im 11/27]
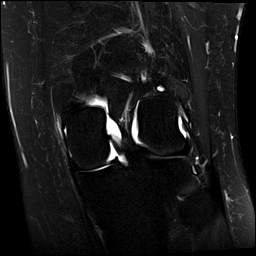
[im 16/27]
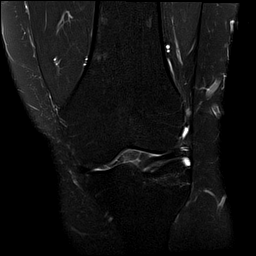
[im 21/27]
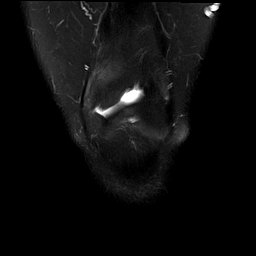
[im 27/27]
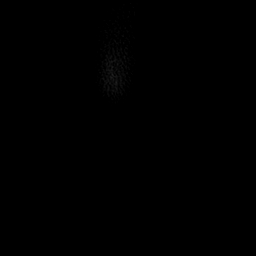

[Series 10: T1 · coronal · left · 4.0mm · 0.59mm/px · 7 of 29 slices shown]
[im 1/29]
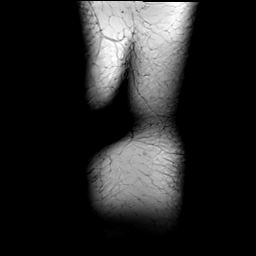
[im 5/29]
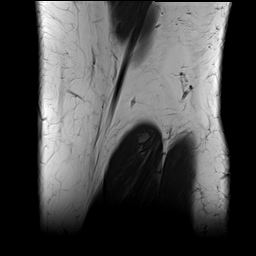
[im 10/29]
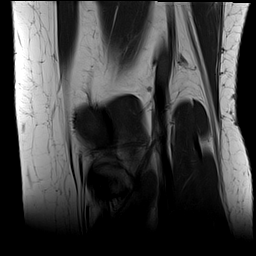
[im 15/29]
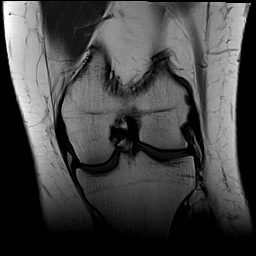
[im 19/29]
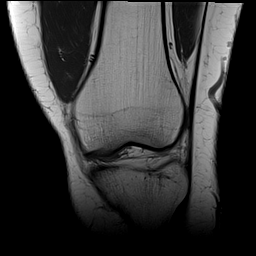
[im 24/29]
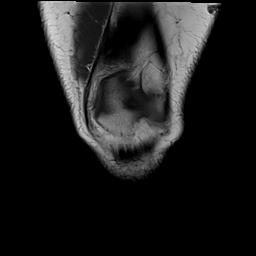
[im 29/29]
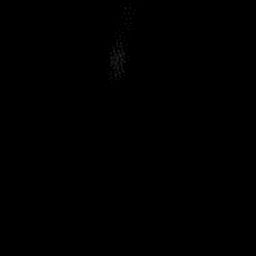

[Series 11: PD fat-sat · coronal · left · 4.0mm · 0.59mm/px · 7 of 30 slices shown (1 of 2)]
[im 1/30]
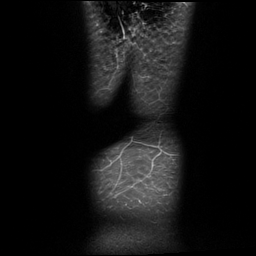
[im 5/30]
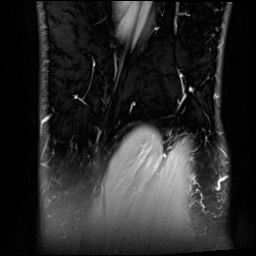
[im 10/30]
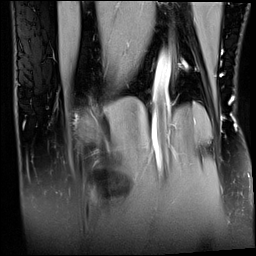
[im 15/30]
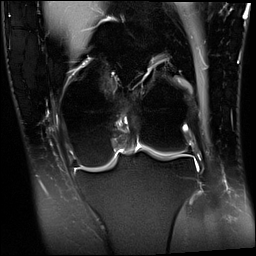
[im 20/30]
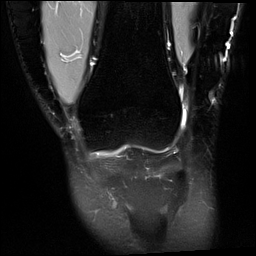
[im 25/30]
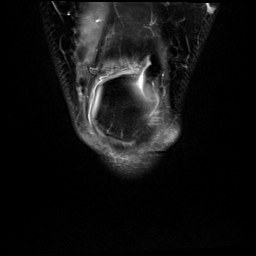
[im 30/30]
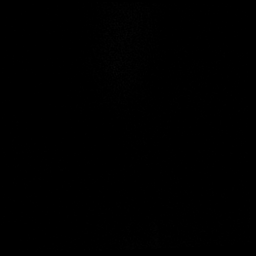

[Series 12: PD fat-sat · sagittal · left · 3.0mm · 0.59mm/px · 7 of 31 slices shown (2 of 2)]
[im 1/31]
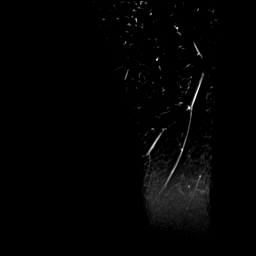
[im 6/31]
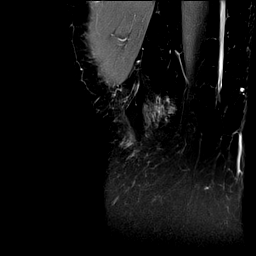
[im 11/31]
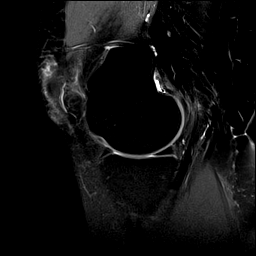
[im 16/31]
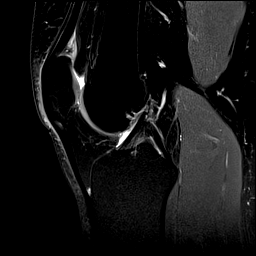
[im 21/31]
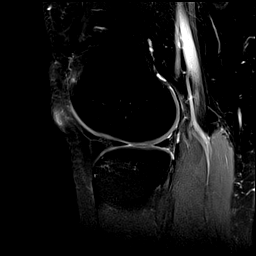
[im 26/31]
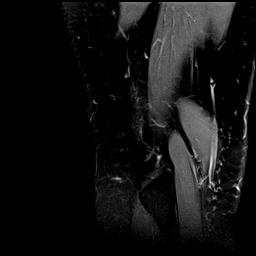
[im 31/31]
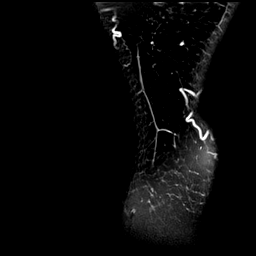

[Series 13: T2 fat-sat · sagittal · left · 3.0mm · 0.59mm/px · 7 of 32 slices shown (3 of 3)]
[im 1/32]
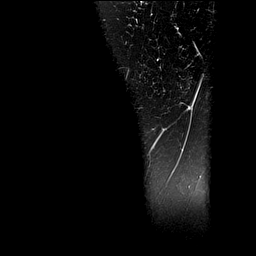
[im 6/32]
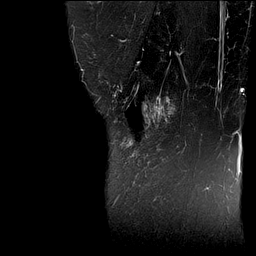
[im 11/32]
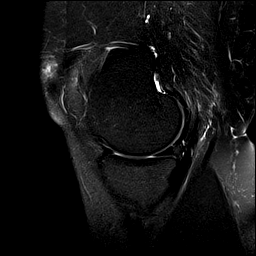
[im 16/32]
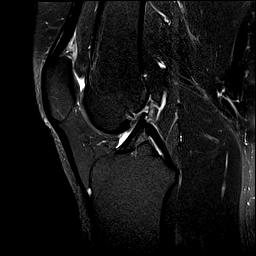
[im 21/32]
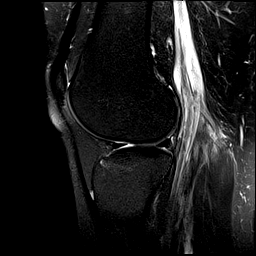
[im 26/32]
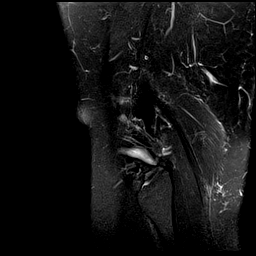
[im 32/32]
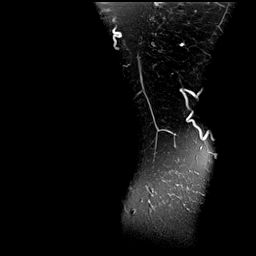

[40 of 40 positions shown; findings below may reference images not displayed]

FINDINGS: MENISCI

Medial meniscus:  Intact.

Lateral meniscus:  Intact.

LIGAMENTS

Cruciates:  Intact.

Collaterals: Medial collateral ligament is intact. Lateral
collateral ligament complex is intact.

CARTILAGE

Patellofemoral:  No chondral defect.

Medial:  No chondral defect.

Lateral:  No chondral defect.

Joint: Trace joint effusion. Normal Hoffa's fat. No plical
thickening.

Popliteal Fossa:  No Baker cyst. Intact popliteus tendon.

Extensor Mechanism: Intact quadriceps tendon and patellar tendon.
Edema within the quadriceps fat pad. Intact medial and lateral
patellar retinaculum. Intact MPFL.

Bones: No focal marrow signal abnormality. No fracture or
dislocation.

Other: None.
IMPRESSION: 1. No meniscal or ligamentous injury.
2. Edema within the quadriceps fat pad. Correlate for quadriceps fat
pad impingement syndrome.

## 2021-03-29 MED FILL — Cholestyramine Powder Packets 4 GM: ORAL | 30 days supply | Qty: 60 | Fill #0 | Status: AC

## 2021-03-30 ENCOUNTER — Other Ambulatory Visit: Payer: Self-pay

## 2021-04-13 ENCOUNTER — Ambulatory Visit: Payer: No Typology Code available for payment source | Admitting: Family

## 2021-04-21 ENCOUNTER — Encounter: Payer: Self-pay | Admitting: Family

## 2021-04-23 ENCOUNTER — Encounter: Payer: Self-pay | Admitting: Family

## 2021-04-26 ENCOUNTER — Telehealth: Payer: No Typology Code available for payment source | Admitting: Family

## 2021-04-27 ENCOUNTER — Telehealth: Payer: No Typology Code available for payment source | Admitting: Family

## 2021-04-29 ENCOUNTER — Telehealth: Payer: No Typology Code available for payment source | Admitting: Family

## 2021-05-05 ENCOUNTER — Telehealth: Payer: Self-pay

## 2021-05-05 NOTE — Telephone Encounter (Signed)
Pt calling; had ectopic preg back in 01/2021; was told to call if thinks she is preg; LMP 03/30/21; last two periods have been normal and regular; she is 9-10 days late by her calculations; home preg test is negative.  Should she be seen or have blood work?  [phone]

## 2021-05-06 ENCOUNTER — Other Ambulatory Visit
Admission: RE | Admit: 2021-05-06 | Discharge: 2021-05-06 | Disposition: A | Discharge status: Home / Self Care | Payer: No Typology Code available for payment source | Attending: Obstetrics and Gynecology | Admitting: Obstetrics and Gynecology

## 2021-05-06 ENCOUNTER — Other Ambulatory Visit: Payer: Self-pay | Admitting: Obstetrics and Gynecology

## 2021-05-06 DIAGNOSIS — N926 Irregular menstruation, unspecified: Secondary | ICD-10-CM

## 2021-05-06 DIAGNOSIS — Z3490 Encounter for supervision of normal pregnancy, unspecified, unspecified trimester: Secondary | ICD-10-CM | POA: Insufficient documentation

## 2021-05-06 DIAGNOSIS — Z3A00 Weeks of gestation of pregnancy not specified: Secondary | ICD-10-CM | POA: Diagnosis not present

## 2021-05-06 LAB — HCG, QUANTITATIVE, PREGNANCY: hCG, Beta Chain, Quant, S: 27 m[IU]/mL — ABNORMAL HIGH (ref <5)

## 2021-05-06 NOTE — Telephone Encounter (Signed)
Called and spoke with patient about scheduling labs. Patient is aware she will need to report to [facility] for labs. Patient was also given number to call to scheduled her ultrasound with Centralized scheduling. And also advise to call to scheduled office visit follow up.

## 2021-05-06 NOTE — Progress Notes (Signed)
Beta hcg ordered today

## 2021-05-07 ENCOUNTER — Other Ambulatory Visit: Payer: Self-pay | Admitting: Obstetrics and Gynecology

## 2021-05-07 DIAGNOSIS — N926 Irregular menstruation, unspecified: Secondary | ICD-10-CM

## 2021-05-08 ENCOUNTER — Other Ambulatory Visit
Admission: RE | Admit: 2021-05-08 | Discharge: 2021-05-08 | Disposition: A | Discharge status: Home / Self Care | Payer: No Typology Code available for payment source | Attending: Obstetrics and Gynecology | Admitting: Obstetrics and Gynecology

## 2021-05-08 DIAGNOSIS — Z3490 Encounter for supervision of normal pregnancy, unspecified, unspecified trimester: Secondary | ICD-10-CM | POA: Insufficient documentation

## 2021-05-08 DIAGNOSIS — Z3A00 Weeks of gestation of pregnancy not specified: Secondary | ICD-10-CM | POA: Insufficient documentation

## 2021-05-10 ENCOUNTER — Encounter: Payer: Self-pay | Admitting: Family

## 2021-05-10 ENCOUNTER — Telehealth: Payer: Self-pay

## 2021-05-10 NOTE — Telephone Encounter (Signed)
Pt calling; had lab drawn Saturday; doesn't see results in [place] like she did last time.  Wants to be sure [facility] is able to see results.  [phone]  Pt aware via [place] lab is not resulted yet.

## 2021-05-11 ENCOUNTER — Other Ambulatory Visit
Admission: RE | Admit: 2021-05-11 | Discharge: 2021-05-11 | Disposition: A | Discharge status: Home / Self Care | Payer: No Typology Code available for payment source | Source: Ambulatory Visit | Attending: Obstetrics and Gynecology | Admitting: Obstetrics and Gynecology

## 2021-05-11 ENCOUNTER — Other Ambulatory Visit: Payer: Self-pay | Admitting: Obstetrics and Gynecology

## 2021-05-11 ENCOUNTER — Other Ambulatory Visit: Payer: Self-pay

## 2021-05-11 DIAGNOSIS — N926 Irregular menstruation, unspecified: Secondary | ICD-10-CM

## 2021-05-11 DIAGNOSIS — Z8759 Personal history of other complications of pregnancy, childbirth and the puerperium: Secondary | ICD-10-CM | POA: Diagnosis present

## 2021-05-11 DIAGNOSIS — Z3490 Encounter for supervision of normal pregnancy, unspecified, unspecified trimester: Secondary | ICD-10-CM | POA: Insufficient documentation

## 2021-05-11 LAB — BETA HCG QUANT (REF LAB): hCG Quant: 84 m[IU]/mL

## 2021-05-11 LAB — HCG, QUANTITATIVE, PREGNANCY: hCG, Beta Chain, Quant, S: 335 m[IU]/mL — ABNORMAL HIGH (ref <5)

## 2021-05-12 ENCOUNTER — Other Ambulatory Visit: Payer: Self-pay | Admitting: Family

## 2021-05-12 ENCOUNTER — Telehealth (INDEPENDENT_AMBULATORY_CARE_PROVIDER_SITE_OTHER): Payer: No Typology Code available for payment source | Admitting: Gastroenterology

## 2021-05-12 DIAGNOSIS — R195 Other fecal abnormalities: Secondary | ICD-10-CM

## 2021-05-12 DIAGNOSIS — K9089 Other intestinal malabsorption: Secondary | ICD-10-CM | POA: Diagnosis not present

## 2021-05-12 DIAGNOSIS — M79672 Pain in left foot: Secondary | ICD-10-CM

## 2021-05-12 NOTE — Progress Notes (Addendum)
Mary Richards , MD [address]  [place]  [place], [place] [address]  Main: [phone]  Fax: [phone]   Primary Care Physician: Burnard Hawthorne, FNP  Virtual Visit via Video Note  I connected with patient on 05/12/21 at  9:00 AM EDT by video and verified that I am speaking with the correct person using two identifiers.   I discussed the limitations, risks, security and privacy concerns of performing an evaluation and management service by video  and the availability of in person appointments. I also discussed with the patient that there may be a patient responsible charge related to this service. The patient expressed understanding and agreed to proceed.  Location of Patient: Home Location of Provider: Home Persons involved: Patient and provider only   History of Present Illness:   Chief complaint follow-up for bile salt induced diarrhea and stool occult positive   HPI: Mary Richards is a 35 y.o. female  Summary of history : Initially referred and seen in March 2022 for diarrhea.  Began after her cholecystectomy.  Mother had colon polyps in her 40s.  She has not tried a bile acid sequestrant so far.   Interval history   01/28/2021-05/12/2021  02/19/2021: Colonoscopy: 2 sessile polyps were resected in the ascending colon 4 to 6 mm in size.  Otherwise examination is normal looking at the images I cannot see any large internal hemorrhoids.  Polyps were sessile serrated polyps.  Repeat colonoscopy in 7 years.  Since her last visit she is doing very well.  Does not have any diarrhea.  She takes Questran 1 packet a day.  No rectal bleeding.  No perianal symptoms. Current Outpatient Medications  Medication Sig Dispense Refill   albuterol (VENTOLIN HFA) 108 (90 Base) MCG/ACT inhaler INHALE 2 PUFFS INTO THE LUNGS EVERY 6 HOURS AS NEEDED FOR WHEEZING OR SHORTNESS OF BREATH. 18 g 1   azelastine (ASTELIN) 0.1 % nasal spray Place 1 spray into both nostrils 2 (two)  times daily. Use in each nostril as directed 30 mL 0   cholestyramine (QUESTRAN) 4 g packet MIX 1 PACKET AS DIRECTED AND TAKE 2 TIMES DAILY. 60 each 2   COVID-19 Ad26 vaccine, JANSSEN/J&J, 0.5 ML injection USE AS DIRECTED (Patient not taking: Reported on 02/19/2021) .5 mL 0   gabapentin (NEURONTIN) 100 MG capsule TAKE 2 CAPSULES BY MOUTH AT BEDTIME 120 capsule 3   ibuprofen (ADVIL) 600 MG tablet Take 1 tablet (600 mg total) by mouth every 6 (six) hours. 30 tablet 0   Multiple Vitamin (MULTI-VITAMIN) tablet Take by mouth.     ondansetron (ZOFRAN-ODT) 4 MG disintegrating tablet Take 1 tablet (4 mg total) by mouth every 6 (six) hours as needed for nausea or vomiting. 20 tablet 0   Probiotic Product (PROBIOTIC ADVANCED PO) Take 1 capsule by mouth daily.     promethazine (PHENERGAN) 25 MG tablet TAKE 1 TABLET BY MOUTH EVERY 6 HOURS AS NEEDED FOR NAUSEA OR VOMITING. 30 tablet 0   Saccharomyces boulardii (PROBIOTIC) 250 MG CAPS TAKE 1 CAPSULE BY MOUTH 2 (TWO) TIMES DAILY. 120 capsule 1   traZODone (DESYREL) 50 MG tablet TAKE 1/2 TO 1 TABLET BY MOUTH AT BEDTIME AS NEEDED FOR SLEEP. 90 tablet 0   No current facility-administered medications for this visit.    Allergies as of 05/12/2021 - Review Complete 02/19/2021  Allergen Reaction Noted   Azithromycin Hives, Shortness Of Breath, and Swelling 11/01/2018   Sulfa antibiotics Hives 12/31/2019   Ciprofloxacin Swelling  11/01/2018   Moxifloxacin hcl Hives 11/01/2018   Bactrim [sulfamethoxazole-trimethoprim] Rash 11/01/2018   Wound dressing adhesive Rash 02/09/2021    Review of Systems:    All systems reviewed and negative except where noted in HPI.  General Appearance:    Alert, cooperative, no distress, appears stated age  Head:    Normocephalic, without obvious abnormality, atraumatic  Eyes:    PERRL, conjunctiva/corneas clear,  Ears:    Grossly normal hearing    Neurologic:  Grossly normal    Observations/Objective:  Labs: CMP     Component  Value Date/Time   NA 138 01/23/2021 1548   NA 139 12/12/2018 1152   K 3.8 01/23/2021 1548   CL 106 01/23/2021 1548   CO2 23 01/23/2021 1548   GLUCOSE 95 01/23/2021 1548   BUN 13 01/23/2021 1548   BUN 14 12/12/2018 1152   CREATININE 0.64 01/23/2021 1548   CALCIUM 9.3 01/23/2021 1548   PROT 7.0 01/23/2021 1548   ALBUMIN 4.3 01/23/2021 1548   AST 19 01/23/2021 1548   ALT 15 01/23/2021 1548   ALKPHOS 74 01/23/2021 1548   BILITOT 1.2 01/23/2021 1548   GFRNONAA >60 01/23/2021 1548   GFRAA 125 12/12/2018 1152   Lab Results  Component Value Date   WBC 15.5 (H) 01/24/2021   HGB 12.8 01/24/2021   HCT 38.6 01/24/2021   MCV 92.3 01/24/2021   PLT 298 01/24/2021    Imaging Studies: No results found.  Assessment and Plan:   Mary Richards is a 35 y.o. y/o female here to follow-up for presumed bile salt mediated diarrhea and blood in the stool which could have been postinfectious versus internal hemorrhoids.  Colonoscopy showed 2 subcentimeter sessile serrated adenomas that were resected otherwise was normal.  Advised to continue Questran at present dose if needed can also decrease the dose or stop it after a few months to see how she does.  No other symptoms of rectal bleeding or hemorrhoidal issues.  We discussed about the conservative management of hemorrhoids.  If they continue to bother her despite that we could always consider banding after anoscopy in the office.  She has been advised to contact my office for a follow-up visit as needed.   Addendum I will have my staff contact her to inform her to use Questran as needed rather than on a scheduled basis as it can theoretically decrease vitamin absorption.  And when used to use half a packet rather than the whole packet to reduce the risks as much as possible.     I discussed the assessment and treatment plan with the patient. The patient was provided an opportunity to ask questions and all were answered. The patient agreed with  the plan and demonstrated an understanding of the instructions.   The patient was advised to call back or seek an in-person evaluation if the symptoms worsen or if the condition fails to improve as anticipated.  I provided 15 minutes of face-to-face time during this encounter.  Dr Mary Bellows MD,MRCP [facility]) Gastroenterology/Hepatology Pager: [phone]   Speech recognition software was used to dictate this note.

## 2021-05-13 ENCOUNTER — Other Ambulatory Visit: Payer: Self-pay | Admitting: Obstetrics and Gynecology

## 2021-05-13 ENCOUNTER — Other Ambulatory Visit
Admission: RE | Admit: 2021-05-13 | Discharge: 2021-05-13 | Disposition: A | Discharge status: Home / Self Care | Payer: No Typology Code available for payment source | Attending: Obstetrics and Gynecology | Admitting: Obstetrics and Gynecology

## 2021-05-13 DIAGNOSIS — Z3490 Encounter for supervision of normal pregnancy, unspecified, unspecified trimester: Secondary | ICD-10-CM

## 2021-05-13 DIAGNOSIS — N926 Irregular menstruation, unspecified: Secondary | ICD-10-CM

## 2021-05-13 DIAGNOSIS — Z8759 Personal history of other complications of pregnancy, childbirth and the puerperium: Secondary | ICD-10-CM

## 2021-05-13 DIAGNOSIS — Z3A00 Weeks of gestation of pregnancy not specified: Secondary | ICD-10-CM | POA: Diagnosis not present

## 2021-05-13 LAB — HCG, QUANTITATIVE, PREGNANCY: hCG, Beta Chain, Quant, S: 971 m[IU]/mL — ABNORMAL HIGH (ref <5)

## 2021-05-13 NOTE — Progress Notes (Signed)
Hcg quant

## 2021-05-15 ENCOUNTER — Other Ambulatory Visit
Admission: RE | Admit: 2021-05-15 | Discharge: 2021-05-15 | Disposition: A | Discharge status: Home / Self Care | Payer: No Typology Code available for payment source | Attending: Obstetrics and Gynecology | Admitting: Obstetrics and Gynecology

## 2021-05-15 DIAGNOSIS — N926 Irregular menstruation, unspecified: Secondary | ICD-10-CM | POA: Diagnosis present

## 2021-05-15 DIAGNOSIS — Z3490 Encounter for supervision of normal pregnancy, unspecified, unspecified trimester: Secondary | ICD-10-CM | POA: Insufficient documentation

## 2021-05-15 DIAGNOSIS — Z8759 Personal history of other complications of pregnancy, childbirth and the puerperium: Secondary | ICD-10-CM | POA: Diagnosis present

## 2021-05-15 LAB — HCG, QUANTITATIVE, PREGNANCY: hCG, Beta Chain, Quant, S: 2847 m[IU]/mL — ABNORMAL HIGH (ref <5)

## 2021-05-17 LAB — BETA HCG QUANT (REF LAB): hCG Quant: 2027 m[IU]/mL

## 2021-05-24 ENCOUNTER — Other Ambulatory Visit: Payer: Self-pay | Admitting: Obstetrics and Gynecology

## 2021-05-24 ENCOUNTER — Other Ambulatory Visit
Admission: RE | Admit: 2021-05-24 | Discharge: 2021-05-24 | Disposition: A | Discharge status: Home / Self Care | Payer: No Typology Code available for payment source | Source: Ambulatory Visit | Attending: Obstetrics and Gynecology | Admitting: Obstetrics and Gynecology

## 2021-05-24 DIAGNOSIS — N926 Irregular menstruation, unspecified: Secondary | ICD-10-CM | POA: Diagnosis present

## 2021-05-24 DIAGNOSIS — Z8759 Personal history of other complications of pregnancy, childbirth and the puerperium: Secondary | ICD-10-CM

## 2021-05-24 DIAGNOSIS — Z3490 Encounter for supervision of normal pregnancy, unspecified, unspecified trimester: Secondary | ICD-10-CM

## 2021-05-24 LAB — HCG, QUANTITATIVE, PREGNANCY: hCG, Beta Chain, Quant, S: 27980 m[IU]/mL — ABNORMAL HIGH (ref <5)

## 2021-05-24 NOTE — Addendum Note (Signed)
Addended by: Lattie Haw on: 05/24/2021 03:03 PM   Modules accepted: Orders

## 2021-05-26 ENCOUNTER — Other Ambulatory Visit: Payer: Self-pay

## 2021-05-26 ENCOUNTER — Ambulatory Visit
Admission: RE | Admit: 2021-05-26 | Discharge: 2021-05-26 | Disposition: A | Discharge status: Home / Self Care | Payer: No Typology Code available for payment source | Source: Ambulatory Visit | Attending: Obstetrics and Gynecology | Admitting: Obstetrics and Gynecology

## 2021-05-26 ENCOUNTER — Encounter: Payer: Self-pay | Admitting: Podiatry

## 2021-05-26 ENCOUNTER — Ambulatory Visit: Payer: Self-pay

## 2021-05-26 ENCOUNTER — Ambulatory Visit (INDEPENDENT_AMBULATORY_CARE_PROVIDER_SITE_OTHER): Payer: No Typology Code available for payment source | Admitting: Podiatry

## 2021-05-26 DIAGNOSIS — M722 Plantar fascial fibromatosis: Secondary | ICD-10-CM | POA: Diagnosis not present

## 2021-05-26 DIAGNOSIS — Z3490 Encounter for supervision of normal pregnancy, unspecified, unspecified trimester: Secondary | ICD-10-CM | POA: Insufficient documentation

## 2021-05-26 NOTE — Progress Notes (Signed)
  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 female presents with the above complaint. History confirmed with patient.'s been going on for some time she has had this before.  She had a during her last pregnancy.  She found out today she is 6 weeks and 1 day pregnant.She wears [place] she is at work she works as a Electrical engineer.  She has been trying some stretches.  The left is worse on the right. Objective:  Physical Exam: warm, good capillary refill, no trophic changes or ulcerative lesions, normal DP and PT pulses, and normal sensory exam. Left Foot: point tenderness over the heel pad Right Foot: point tenderness over the heel pad less intense than the left foot   Assessment:   1. Plantar fasciitis, bilateral      Plan:  Patient was evaluated and treated and all questions answered.  Discussed the etiology and treatment options for plantar fasciitis including stretching, formal physical therapy, supportive shoegears such as a running shoe or sneaker, pre fabricated orthoses, injection therapy, and oral medications. We also discussed the role of surgical treatment of this for patients who do not improve after exhausting non-surgical treatment options.   -She is previously had x-rays, I did not take new ones today to reduce any risk and felt that it would be of low utility -Educated patient on stretching and icing of the affected limb -Plantar fascial brace dispensed -We will avoid NSAIDs during her pregnancy.  Discussed with her this may make this more difficult to treat. -Consider MRI if not improving by next visit  I did recommend a corticosteroid injection.  We discussed the risks and benefits of this.  We also discussed hyperglycemia from  corticosteroid administration, she does not have any history of gestational diabetes.  Following sterile prep with alcohol and utilizing ethyl chloride spray I injected 1/2 cc of 2% lidocaine plain and 2 mg of dexamethasone and 5 mg of Kenalog into the left heel at the point of maximal tenderness.  She tolerated this well.  Today we did not inject the right heel, will do this in the future if it is not improving.  Other than this would like to avoid future injections during her pregnancy   No follow-ups on file.

## 2021-05-28 ENCOUNTER — Ambulatory Visit (INDEPENDENT_AMBULATORY_CARE_PROVIDER_SITE_OTHER): Payer: No Typology Code available for payment source | Admitting: Obstetrics and Gynecology

## 2021-05-28 ENCOUNTER — Encounter: Payer: Self-pay | Admitting: Obstetrics and Gynecology

## 2021-05-28 ENCOUNTER — Other Ambulatory Visit: Payer: Self-pay

## 2021-05-28 ENCOUNTER — Other Ambulatory Visit (HOSPITAL_COMMUNITY)
Admission: RE | Admit: 2021-05-28 | Discharge: 2021-05-28 | Disposition: A | Discharge status: Home / Self Care | Payer: No Typology Code available for payment source | Source: Ambulatory Visit | Attending: Obstetrics and Gynecology | Admitting: Obstetrics and Gynecology

## 2021-05-28 VITALS — BP 120/80 | Wt 200.0 lb

## 2021-05-28 DIAGNOSIS — J45909 Unspecified asthma, uncomplicated: Secondary | ICD-10-CM | POA: Insufficient documentation

## 2021-05-28 DIAGNOSIS — Z3481 Encounter for supervision of other normal pregnancy, first trimester: Secondary | ICD-10-CM | POA: Diagnosis not present

## 2021-05-28 DIAGNOSIS — O99211 Obesity complicating pregnancy, first trimester: Secondary | ICD-10-CM

## 2021-05-28 DIAGNOSIS — Z3A01 Less than 8 weeks gestation of pregnancy: Secondary | ICD-10-CM

## 2021-05-28 DIAGNOSIS — Z3480 Encounter for supervision of other normal pregnancy, unspecified trimester: Secondary | ICD-10-CM | POA: Insufficient documentation

## 2021-05-28 DIAGNOSIS — O99210 Obesity complicating pregnancy, unspecified trimester: Secondary | ICD-10-CM | POA: Insufficient documentation

## 2021-05-28 DIAGNOSIS — O99519 Diseases of the respiratory system complicating pregnancy, unspecified trimester: Secondary | ICD-10-CM

## 2021-05-28 NOTE — Patient Instructions (Signed)
Fish precautions: [email or web address]  [email or web address]

## 2021-05-28 NOTE — Progress Notes (Signed)
New Obstetric Patient H&P   Chief Complaint: "Desires prenatal care"  History of Present Illness: Patient is a 35 y.o. G3P1011 Not Hispanic or Latino female, LMP 03/30/2021 presents with amenorrhea and positive home pregnancy test. Based on a [redacted]w[redacted]d ultrasound on 7/13/202, her EDD is Estimated Date of Delivery: 01/18/22 and her EGA is [redacted]w[redacted]d. Her last pap smear was 2.5 years ago and was no abnormalities.    Since her LMP she claims she has experienced no issues. She denies vaginal bleeding. Her past medical history is notable for asthma. Her prior pregnancies are notable for early pregnancy bleeding, preterm labor without delivery at about 32 weeks. Her  son went on to be born at 10 weeks.  She states that she wasn't in labor for very long.   Since her LMP, she admits to the use of tobacco products  no She claims she has gained zero pounds since the start of her pregnancy.  There are cats in the home in the home. If yes the cat is indoor. She wears and mas, and gloves when she changes the litter.  She admits close contact with children on a regular basis  yes  She has had chicken pox in the past yes She has had Tuberculosis exposures, symptoms, or previously tested positive for TB   no Current or past history of domestic violence. no  Genetic Screening/Teratology Counseling: (Includes patient, baby's father, or anyone in either family with:)   62. Patient's age >/= 21 [facility]  no 2. Thalassemia ([place], [place], [place], or Asian background): MCV<80  no 3. Neural tube defect (meningomyelocele, spina bifida, anencephaly)  no 4. Congenital heart defect  no  5. Down syndrome  no 6. Tay-Sachs (Jewish, [place])  no 7. Canavan's Disease  no 8. Sickle cell disease or trait (African)  no  9. Hemophilia or other blood disorders  no  10. Muscular dystrophy  no  11. Cystic fibrosis  no  12. Huntington's Chorea  no  13. Mental retardation/autism  no 14. Other inherited genetic or  chromosomal disorder  no 15. Maternal metabolic disorder (DM, PKU, etc)  no 16. Patient or FOB with a child with a birth defect not listed above no  16a. Patient or FOB with a birth defect themselves no 17. Recurrent pregnancy loss, or stillbirth  no  18. Any medications since LMP other than prenatal vitamins (include vitamins, supplements, OTC meds, drugs, alcohol)  she was taking trazodone, gabapentin, and some other medicines. She has stopped all these.  She is only taking a PNV and supplements.  19. Any other genetic/environmental exposure to discuss  no  Infection History:   1. Lives with someone with TB or TB exposed  no  2. Patient or partner has history of genital herpes  no 3. Rash or viral illness since LMP  no 4. History of STI (GC, CT, HPV, syphilis, HIV)  no 5. History of recent travel :  no  Other pertinent information:  no   Review of Systems:10 point review of systems negative unless otherwise noted in HPI  Past Medical History:  Diagnosis Date   Asthma    Chronic migraine    Complication of anesthesia    DDD (degenerative disc disease), lumbosacral    Family history of adverse reaction to anesthesia    Mother and Father - PONV   PONV (postoperative nausea and vomiting)    Wears contact lenses     Past Surgical History:  Procedure Laterality Date  CHOLECYSTECTOMY     COLONOSCOPY WITH PROPOFOL N/A 02/19/2021   Procedure: COLONOSCOPY WITH PROPOFOL;  Surgeon: Jonathon Bellows, MD;  Location: [facility] ENDOSCOPY;  Service: Gastroenterology;  Laterality: N/A;   DIAGNOSTIC LAPAROSCOPY WITH REMOVAL OF ECTOPIC PREGNANCY Left 01/23/2021   Procedure: DIAGNOSTIC LAPAROSCOPY WITH REMOVAL OF ECTOPIC PREGNANCY;  Surgeon: Will Bonnet, MD;  Location: [facility] ORS;  Service: Gynecology;  Laterality: Left;   PILONIDAL CYST EXCISION     TONSILECTOMY, ADENOIDECTOMY, BILATERAL MYRINGOTOMY AND TUBES     TYMPANOPLASTY Right 05/10/2019   Procedure: TYMPANOPLASTY;  Surgeon: Beverly Gust,  MD;  Location: [place];  Service: ENT;  Laterality: Right;    Gynecologic History: Patient's last menstrual period was 03/30/2021.  Obstetric History: G3P1011   Family History  Problem Relation Age of Onset   Brain cancer Father 68   Valvular heart disease Sister    Brain cancer Paternal Grandmother 19   COPD Maternal Grandfather    Irritable bowel syndrome Mother    Thyroid cancer Neg Hx    Colon cancer Neg Hx     Social History   Socioeconomic History   Marital status: Married    Spouse name: Not on file   Number of children: Not on file   Years of education: Not on file   Highest education level: Not on file  Occupational History   Not on file  Tobacco Use   Smoking status: Never   Smokeless tobacco: Never  Vaping Use   Vaping Use: Never used  Substance and Sexual Activity   Alcohol use: Yes    Alcohol/week: 2.0 standard drinks    Types: 2 Standard drinks or equivalent per week    Comment: Social    Drug use: Never   Sexual activity: Yes    Birth control/protection: Condom  Other Topics Concern   Not on file  Social History Narrative   Works as Designer, multimedia in ICU   2 children   Social Determinants of Radio broadcast assistant Strain: Not on file  Food Insecurity: Not on file  Transportation Needs: Not on file  Physical Activity: Not on file  Stress: Not on file  Social Connections: Not on file  Intimate Partner Violence: Not on file    Allergies  Allergen Reactions   Azithromycin Hives, Shortness Of Breath and Swelling    Throat swelling   Sulfa Antibiotics Hives   Ciprofloxacin Swelling   Moxifloxacin Hcl Hives   Bactrim [Sulfamethoxazole-Trimethoprim] Rash   Wound Dressing Adhesive Rash    Dermabond    Prior to Admission medications   Medication Sig Start Date End Date Taking? Authorizing Provider  albuterol (VENTOLIN HFA) 108 (90 Base) MCG/ACT inhaler INHALE 2 PUFFS INTO THE LUNGS EVERY 6 HOURS AS NEEDED FOR WHEEZING OR SHORTNESS OF  BREATH. 01/12/21 01/12/22 Yes Arnett, Yvetta Coder, FNP  Multiple Vitamin (MULTI-VITAMIN) tablet Take by mouth.   Yes [provider]  Saccharomyces boulardii (PROBIOTIC) 250 MG CAPS TAKE 1 CAPSULE BY MOUTH 2 (TWO) TIMES DAILY. 01/11/21 01/11/22 Yes Burnard Hawthorne, FNP  vitamin E 400 UNIT/15ML LIQD  02/04/21  Yes [provider]    Physical Exam BP 120/80   Wt 200 lb (90.7 kg)   LMP 03/30/2021   BMI 32.28 kg/m   Physical Exam Constitutional:      General: She is not in acute distress.    Appearance: Normal appearance. She is well-developed.  Genitourinary:     Right Labia: No rash, tenderness, lesions or skin changes.  Left Labia: No tenderness, lesions, skin changes or rash.    Pelvic Tanner Score: 5/5. HENT:     Head: Normocephalic and atraumatic.  Eyes:     General: No scleral icterus.    Conjunctiva/sclera: Conjunctivae normal.  Cardiovascular:     Rate and Rhythm: Normal rate and regular rhythm.     Heart sounds: No murmur heard.   No friction rub. No gallop.  Pulmonary:     Effort: Pulmonary effort is normal. No respiratory distress.     Breath sounds: Normal breath sounds. No wheezing or rales.  Abdominal:     General: Bowel sounds are normal. There is no distension.     Palpations: Abdomen is soft. There is no mass.     Tenderness: There is no abdominal tenderness. There is no guarding or rebound.     Hernia: There is no hernia in the left inguinal area or right inguinal area.  Musculoskeletal:        General: Normal range of motion.     Cervical back: Normal range of motion and neck supple.  Neurological:     General: No focal deficit present.     Mental Status: She is alert and oriented to person, place, and time.     Cranial Nerves: No cranial nerve deficit.  Skin:    General: Skin is warm and dry.     Findings: No erythema.  Psychiatric:        Mood and Affect: Mood normal.        Behavior: Behavior normal.        Judgment: Judgment normal.     Female Chaperone present during breast and/or pelvic exam.  Assessment: 35 y.o.G3P1011 at [redacted]w[redacted]d presenting to initiate prenatal care  Plan: 1) Avoid alcoholic beverages. 2) Patient encouraged not to smoke.  3) Discontinue the use of all non-medicinal drugs and chemicals.  4) Take prenatal vitamins daily.  5) Nutrition, food safety (fish, cheese advisories, and high nitrite foods) and exercise discussed. 6) Hospital and practice style discussed with cross coverage system.  7) Genetic Screening, such as with 1st Trimester Screening, cell free fetal DNA, AFP testing, and Ultrasound, as well as with amniocentesis and CVS as appropriate, is discussed with patient. At the conclusion of today's visit patient declined genetic testing 8) Patient is asked about travel to areas at risk for the [place] virus, and counseled to avoid travel and exposure to mosquitoes or sexual partners who may have themselves been exposed to the virus. Testing is discussed, and will be ordered as appropriate.   Prentice Docker, MD 05/28/2021 9:10 AM

## 2021-05-29 LAB — RPR+RH+ABO+RUB AB+AB SCR+CB...
Antibody Screen: NEGATIVE
HIV Screen 4th Generation wRfx: NONREACTIVE
Hematocrit: 43.4 % (ref 34.0–46.6)
Hemoglobin: 14 g/dL (ref 11.1–15.9)
Hepatitis B Surface Ag: NEGATIVE
MCH: 29.4 pg (ref 26.6–33.0)
MCHC: 32.3 g/dL (ref 31.5–35.7)
MCV: 91 fL (ref 79–97)
Platelets: 374 10*3/uL (ref 150–450)
RBC: 4.77 x10E6/uL (ref 3.77–5.28)
RDW: 12.2 % (ref 11.7–15.4)
RPR Ser Ql: NONREACTIVE
Rh Factor: POSITIVE
Rubella Antibodies, IGG: 3.78 index (ref >0.99)
Varicella zoster IgG: 3547 index (ref >165)
WBC: 12.4 10*3/uL — ABNORMAL HIGH (ref 3.4–10.8)

## 2021-05-29 LAB — HGB A1C W/O EAG: Hgb A1c MFr Bld: 5.1 % (ref 4.8–5.6)

## 2021-05-31 LAB — URINE CULTURE

## 2021-05-31 LAB — CERVICOVAGINAL ANCILLARY ONLY
Chlamydia: NEGATIVE
Comment: NEGATIVE
Comment: NORMAL
Neisseria Gonorrhea: NEGATIVE

## 2021-06-02 ENCOUNTER — Other Ambulatory Visit: Payer: Self-pay | Admitting: Obstetrics and Gynecology

## 2021-06-02 DIAGNOSIS — O2341 Unspecified infection of urinary tract in pregnancy, first trimester: Secondary | ICD-10-CM

## 2021-06-02 MED ORDER — CEPHALEXIN 500 MG PO CAPS
500.0000 mg | ORAL_CAPSULE | Freq: Four times a day (QID) | ORAL | 0 refills | Status: AC
  Filled 2021-06-02: qty 28, 7d supply, fill #0

## 2021-06-03 ENCOUNTER — Other Ambulatory Visit: Payer: Self-pay

## 2021-06-08 ENCOUNTER — Telehealth: Payer: Self-pay

## 2021-06-08 NOTE — Telephone Encounter (Signed)
NO opening in the scheduled this week to offer appointment. Patient states she will follow up at her next scheduling appointment 06/25/21 with AMS

## 2021-06-08 NOTE — Telephone Encounter (Signed)
Pt calling; has a place come up on side like a bulge the size of a grape under the skin next to incision on the right side.  Is very sore.  Should she be seen?  [phone]

## 2021-06-09 ENCOUNTER — Ambulatory Visit: Payer: No Typology Code available for payment source | Admitting: Podiatry

## 2021-06-16 ENCOUNTER — Other Ambulatory Visit: Payer: Self-pay

## 2021-06-16 ENCOUNTER — Ambulatory Visit: Payer: No Typology Code available for payment source | Admitting: Podiatry

## 2021-06-16 DIAGNOSIS — M722 Plantar fascial fibromatosis: Secondary | ICD-10-CM | POA: Diagnosis not present

## 2021-06-16 DIAGNOSIS — M7672 Peroneal tendinitis, left leg: Secondary | ICD-10-CM

## 2021-06-16 DIAGNOSIS — M25372 Other instability, left ankle: Secondary | ICD-10-CM | POA: Diagnosis not present

## 2021-06-16 NOTE — Patient Instructions (Signed)
Plantar Fasciitis (Heel Spur Syndrome) with Rehab The plantar fascia is a fibrous, ligament-like, soft-tissue structure that spans the bottom of the foot. Plantar fasciitis is a condition that causes pain in the foot due to inflammation of the tissue. SYMPTOMS   Pain and tenderness on the underneath side of the foot.  Pain that worsens with standing or walking. CAUSES  Plantar fasciitis is caused by irritation and injury to the plantar fascia on the underneath side of the foot. Common mechanisms of injury include:  Direct trauma to bottom of the foot.  Damage to a small nerve that runs under the foot where the main fascia attaches to the heel bone.  Stress placed on the plantar fascia due to bone spurs. RISK INCREASES WITH:   Activities that place stress on the plantar fascia (running, jumping, pivoting, or cutting).  Poor strength and flexibility.  Improperly fitted shoes.  Tight calf muscles.  Flat feet.  Failure to warm-up properly before activity.  Obesity. PREVENTION  Warm up and stretch properly before activity.  Allow for adequate recovery between workouts.  Maintain physical fitness:  Strength, flexibility, and endurance.  Cardiovascular fitness.  Maintain a health body weight.  Avoid stress on the plantar fascia.  Wear properly fitted shoes, including arch supports for individuals who have flat feet.  PROGNOSIS  If treated properly, then the symptoms of plantar fasciitis usually resolve without surgery. However, occasionally surgery is necessary.  RELATED COMPLICATIONS   Recurrent symptoms that may result in a chronic condition.  Problems of the lower back that are caused by compensating for the injury, such as limping.  Pain or weakness of the foot during push-off following surgery.  Chronic inflammation, scarring, and partial or complete fascia tear, occurring more often from repeated injections.  TREATMENT  Treatment initially involves the  use of ice and medication to help reduce pain and inflammation. The use of strengthening and stretching exercises may help reduce pain with activity, especially stretches of the Achilles tendon. These exercises may be performed at home or with a therapist. Your caregiver may recommend that you use heel cups of arch supports to help reduce stress on the plantar fascia. Occasionally, corticosteroid injections are given to reduce inflammation. If symptoms persist for greater than 6 months despite non-surgical (conservative), then surgery may be recommended.   MEDICATION   If pain medication is necessary, then nonsteroidal anti-inflammatory medications, such as aspirin and ibuprofen, or other minor pain relievers, such as acetaminophen, are often recommended.  Do not take pain medication within 7 days before surgery.  Prescription pain relievers may be given if deemed necessary by your caregiver. Use only as directed and only as much as you need.  Corticosteroid injections may be given by your caregiver. These injections should be reserved for the most serious cases, because they may only be given a certain number of times.  HEAT AND COLD  Cold treatment (icing) relieves pain and reduces inflammation. Cold treatment should be applied for 10 to 15 minutes every 2 to 3 hours for inflammation and pain and immediately after any activity that aggravates your symptoms. Use ice packs or massage the area with a piece of ice (ice massage).  Heat treatment may be used prior to performing the stretching and strengthening activities prescribed by your caregiver, physical therapist, or athletic trainer. Use a heat pack or soak the injury in warm water.  SEEK IMMEDIATE MEDICAL CARE IF:  Treatment seems to offer no benefit, or the condition worsens.  Any medications   produce adverse side effects.  EXERCISES- RANGE OF MOTION (ROM) AND STRETCHING EXERCISES - Plantar Fasciitis (Heel Spur Syndrome) These exercises  may help you when beginning to rehabilitate your injury. Your symptoms may resolve with or without further involvement from your physician, physical therapist or athletic trainer. While completing these exercises, remember:   Restoring tissue flexibility helps normal motion to return to the joints. This allows healthier, less painful movement and activity.  An effective stretch should be held for at least 30 seconds.  A stretch should never be painful. You should only feel a gentle lengthening or release in the stretched tissue.  RANGE OF MOTION - Toe Extension, Flexion  Sit with your right / left leg crossed over your opposite knee.  Grasp your toes and gently pull them back toward the top of your foot. You should feel a stretch on the bottom of your toes and/or foot.  Hold this stretch for 10 seconds.  Now, gently pull your toes toward the bottom of your foot. You should feel a stretch on the top of your toes and or foot.  Hold this stretch for 10 seconds. Repeat  times. Complete this stretch 3 times per day.   RANGE OF MOTION - Ankle Dorsiflexion, Active Assisted  Remove shoes and sit on a chair that is preferably not on a carpeted surface.  Place right / left foot under knee. Extend your opposite leg for support.  Keeping your heel down, slide your right / left foot back toward the chair until you feel a stretch at your ankle or calf. If you do not feel a stretch, slide your bottom forward to the edge of the chair, while still keeping your heel down.  Hold this stretch for 10 seconds. Repeat 3 times. Complete this stretch 2 times per day.   STRETCH  Gastroc, Standing  Place hands on wall.  Extend right / left leg, keeping the front knee somewhat bent.  Slightly point your toes inward on your back foot.  Keeping your right / left heel on the floor and your knee straight, shift your weight toward the wall, not allowing your back to arch.  You should feel a gentle stretch  in the right / left calf. Hold this position for 10 seconds. Repeat 3 times. Complete this stretch 2 times per day.  STRETCH  Soleus, Standing  Place hands on wall.  Extend right / left leg, keeping the other knee somewhat bent.  Slightly point your toes inward on your back foot.  Keep your right / left heel on the floor, bend your back knee, and slightly shift your weight over the back leg so that you feel a gentle stretch deep in your back calf.  Hold this position for 10 seconds. Repeat 3 times. Complete this stretch 2 times per day.  STRETCH  Gastrocsoleus, Standing  Note: This exercise can place a lot of stress on your foot and ankle. Please complete this exercise only if specifically instructed by your caregiver.   Place the ball of your right / left foot on a step, keeping your other foot firmly on the same step.  Hold on to the wall or a rail for balance.  Slowly lift your other foot, allowing your body weight to press your heel down over the edge of the step.  You should feel a stretch in your right / left calf.  Hold this position for 10 seconds.  Repeat this exercise with a slight bend in your right /   gain both the endurance and the strength needed for everyday activities through controlled exercises. Complete these exercises as instructed by your physician, physical therapist or athletic trainer. Progress the resistance and repetitions only as guided.  STRENGTH - Towel Curls Sit in a chair positioned on a non-carpeted surface. Place your foot on a towel, keeping your heel on the floor. Pull the towel toward your heel by only curling your toes. Keep your heel on the floor. Repeat 3 times.  Complete this exercise 2 times per day.  STRENGTH - Ankle Inversion Secure one end of a rubber exercise band/tubing to a fixed object (table, pole). Loop the other end around your foot just before your toes. Place your fists between your knees. This will focus your strengthening at your ankle. Slowly, pull your big toe up and in, making sure the band/tubing is positioned to resist the entire motion. Hold this position for 10 seconds. Have your muscles resist the band/tubing as it slowly pulls your foot back to the starting position. Repeat 3 times. Complete this exercises 2 times per day.  Document Released: 10/31/2005 Document Revised: 01/23/2012 Document Reviewed: 02/12/2009 [facility] Patient Information 2014 [place], [place].   Peroneal Tendinopathy Rehab Ask your health care provider which exercises are safe for you. Do exercises exactly as told by your health care provider and adjust them as directed. It is normal to feel mild stretching, pulling, tightness, or discomfort as you do these exercises. Stop right away if you feel sudden pain or your pain gets worse. Do not begin these exercises until told by your health care provider. Stretching and range-of-motion exercises These exercises warm up your muscles and joints and improve the movement and flexibility of your ankle. These exercises also help to relieve pain and stiffness. Gastroc and soleus stretch, standing  This is an exercise in which you stand on a step and use your body weight to stretch your calf muscles. To do this exercise: Stand on the edge of a step on the ball of your left / right foot. The ball of your foot is on the walking surface, right under your toes. Keep your other foot firmly on the same step. Hold on to the wall, a railing, or a chair for balance. Slowly lift your other foot, allowing your body weight to press your left / right heel down over the edge of the step. You should feel a stretch in your left / right  calf (gastrocnemius and soleus). Hold this position for 15 seconds. Return both feet to the step. Repeat this exercise with a slight bend in your left / right knee. Repeat 5 times with your left / right knee straight and 5 times with your left / right knee bent. Complete this exercise 2 times a day. Strengthening exercises These exercises build strength and endurance in your foot and ankle. Endurance is the ability to use your muscles for a long time, even after they get tired. Ankle dorsiflexion with band   Secure a rubber exercise band or tube to an object, such as a table leg, that will not move when the band is pulled. Secure the other end of the band around your left / right foot. Sit on the floor, facing the object with your left / right leg extended. The band or tube should be slightly tense when your foot is relaxed. Slowly flex your left / right ankle and toes to bring your foot toward you (dorsiflexion). Hold this position for 15 seconds. Let the band  or tube slowly pull your foot back to the starting position. Repeat 5 times. Complete this exercise 2 times a day. Ankle eversion Sit on the floor with your legs straight out in front of you. Loop a rubber exercise band or tube around the ball of your left / right foot. The ball of your foot is on the walking surface, right under your toes. Hold the ends of the band in your hands, or secure the band to a stable object. The band or tube should be slightly tense when your foot is relaxed. Slowly push your foot outward, away from your other leg (eversion). Hold this position for 15 seconds. Slowly return your foot to the starting position. Repeat 5 times. Complete this exercise 2 times a day. Plantar flexion, standing  This exercise is sometimes called standing heel raise. Stand with your feet shoulder-width apart. Place your hands on a wall or table to steady yourself as needed, but try not to use it for support. Keep your weight  spread evenly over the width of your feet while you slowly rise up on your toes (plantar flexion). If told by your health care provider: Shift your weight toward your left / right leg until you feel challenged. Stand on your left / right leg only. Hold this position for 15 seconds. Repeat 2 times. Complete this exercise 2 times a day. Single leg stand Without shoes, stand near a railing or in a doorway. You may hold on to the railing or door frame as needed. Stand on your left / right foot. Keep your big toe down on the floor and try to keep your arch lifted. Do not roll to the outside of your foot. If this exercise is too easy, you can try it with your eyes closed or while standing on a pillow. Hold this position for 15 seconds. Repeat 5 times. Complete this exercise 2 times a day. This information is not intended to replace advice given to you by your health care provider. Make sure you discuss any questions you have with your health care provider. Document Revised: 02/19/2019 Document Reviewed: 02/19/2019 Elsevier Patient Education  [place].

## 2021-06-20 ENCOUNTER — Encounter: Payer: Self-pay | Admitting: Podiatry

## 2021-06-20 NOTE — Progress Notes (Signed)
  Subjective:  Patient ID: Mary Richards, female    DOB: 08/31/1986,  MRN: [id number]  Chief Complaint  Patient presents with   Plantar Fasciitis    4 week follow up right foot    35 y.o. female returns with the above complaint. History confirmed with patient.'s been going on for some time she has had this before.  Plantar fascial braces have been helpful.  She is now having more noticeable left lateral ankle pain Objective:  Physical Exam: warm, good capillary refill, no trophic changes or ulcerative lesions, normal DP and PT pulses, and normal sensory exam. Left Foot: point tenderness over the heel pad, she has pain over the peroneal tendons now that is more intense and with resisted eversion Right Foot: point tenderness over the heel pad less intense than the left foot   Assessment:   1. Plantar fasciitis, bilateral   2. Peroneal tendinitis of left lower extremity   3. Instability of left ankle joint      Plan:  Patient was evaluated and treated and all questions answered.  Discussed the etiology and treatment options for plantar fasciitis including stretching, formal physical therapy, supportive shoegears such as a running shoe or sneaker, pre fabricated orthoses, injection therapy, and oral medications. We also discussed the role of surgical treatment of this for patients who do not improve after exhausting non-surgical treatment options.   -Continue home exercise plan with stretching and icing of the affected limb.  I think if not improving by next visit we should plan for physical therapy to work on this -Continue wearing plantar fascial brace.  Recommended increase support on the left lateral ankle with a Tri-Lock ankle brace and this was dispensed today as well -No further injections for now.  She will discuss with her obstetrician if Voltaren is safe to use   Return in about 1 month (around 07/17/2021) for recheck plantar fasciitis, re-check peroneal tendinitis.

## 2021-06-25 ENCOUNTER — Other Ambulatory Visit: Payer: Self-pay

## 2021-06-25 ENCOUNTER — Ambulatory Visit (INDEPENDENT_AMBULATORY_CARE_PROVIDER_SITE_OTHER): Payer: No Typology Code available for payment source | Admitting: Obstetrics and Gynecology

## 2021-06-25 VITALS — BP 118/76 | Wt 201.0 lb

## 2021-06-25 DIAGNOSIS — N390 Urinary tract infection, site not specified: Secondary | ICD-10-CM

## 2021-06-25 DIAGNOSIS — R300 Dysuria: Secondary | ICD-10-CM

## 2021-06-25 DIAGNOSIS — Z3A10 10 weeks gestation of pregnancy: Secondary | ICD-10-CM

## 2021-06-25 DIAGNOSIS — Z3480 Encounter for supervision of other normal pregnancy, unspecified trimester: Secondary | ICD-10-CM

## 2021-06-25 LAB — POCT URINALYSIS DIPSTICK
Bilirubin, UA: NEGATIVE
Blood, UA: NEGATIVE
Glucose, UA: NEGATIVE
Ketones, UA: NEGATIVE
Nitrite, UA: NEGATIVE
Protein, UA: NEGATIVE
Spec Grav, UA: 1.01 (ref 1.010–1.025)
Urobilinogen, UA: 0.2 E.U./dL
pH, UA: 6.5 (ref 5.0–8.0)

## 2021-06-25 NOTE — Progress Notes (Signed)
    Routine Prenatal Care Visit  Subjective  Mary Richards is a 35 y.o. G3P1011 at 65w3dbeing seen today for ongoing prenatal care.  She is currently monitored for the following issues for this low-risk pregnancy and has GAD (generalized anxiety disorder); Chronic low back pain; Migraine; Routine physical examination; Drug allergy; Bilateral impacted cerumen; Diarrhea; Encounter for examination of blood pressure with abnormal findings; Ruptured left tubal ectopic pregnancy causing hemoperitoneum; Supervision of other normal pregnancy, antepartum; Asthma during pregnancy; and Obesity affecting pregnancy on their problem list.  ----------------------------------------------------------------------------------- Patient reports no complaints.   Contractions: Not present. Vag. Bleeding: None.  Movement: Absent. Denies leaking of fluid.  ----------------------------------------------------------------------------------- The following portions of the patient's history were reviewed and updated as appropriate: allergies, current medications, past family history, past medical history, past social history, past surgical history and problem list. Problem list updated.   Objective  Blood pressure 118/76, weight 201 lb (91.2 kg), last menstrual period 03/30/2021, unknown if currently breastfeeding. Pregravid weight 200 lb (90.7 kg) Total Weight Gain 1 lb (0.454 kg) Urinalysis:      Fetal Status: Fetal Heart Rate (bpm): 160   Movement: Absent     General:  Alert, oriented and cooperative. Patient is in no acute distress.  Skin: Skin is warm and dry. No rash noted.   Cardiovascular: Normal heart rate noted  Respiratory: Normal respiratory effort, no problems with respiration noted  Abdomen: Soft, gravid, appropriate for gestational age. Pain/Pressure: Absent     Pelvic:  Cervical exam deferred        Extremities: Normal range of motion.     ental Status: Normal mood and affect. Normal behavior.  Normal judgment and thought content.     Assessment   35y.o. G3P1011 at 168w3dy  01/18/2022, by Ultrasound presenting for routine prenatal visit  Plan   pregnancy3 Problems (from 05/28/21 to present)     Problem Noted Resolved   Supervision of other normal pregnancy, antepartum 05/28/2021 by JaWill BonnetMD No   Asthma during pregnancy 05/28/2021 by JaWill BonnetMD No   Obesity affecting pregnancy 05/28/2021 by JaWill BonnetMD No        Gestational age appropriate obstetric precautions including but not limited to vaginal bleeding, contractions, leaking of fluid and fetal movement were reviewed in detail with the patient.    -declines genetics  Return in about 4 weeks (around 07/23/2021) for ROB.  AnMalachy MoodMD, FALoura PardonB/GYN, [place]/10/2021, 10:44 AM

## 2021-06-25 NOTE — Progress Notes (Signed)
ROB - burning with urination. RM 5

## 2021-06-27 LAB — URINE CULTURE

## 2021-06-30 ENCOUNTER — Other Ambulatory Visit: Payer: Self-pay

## 2021-06-30 MED FILL — Cholestyramine Powder Packets 4 GM: ORAL | 30 days supply | Qty: 60 | Fill #1 | Status: AC

## 2021-07-01 ENCOUNTER — Other Ambulatory Visit: Payer: Self-pay

## 2021-07-21 ENCOUNTER — Other Ambulatory Visit: Payer: Self-pay

## 2021-07-21 ENCOUNTER — Encounter: Payer: Self-pay | Admitting: Obstetrics and Gynecology

## 2021-07-21 ENCOUNTER — Ambulatory Visit (INDEPENDENT_AMBULATORY_CARE_PROVIDER_SITE_OTHER): Payer: No Typology Code available for payment source | Admitting: Obstetrics and Gynecology

## 2021-07-21 ENCOUNTER — Other Ambulatory Visit: Payer: Self-pay | Admitting: Obstetrics and Gynecology

## 2021-07-21 ENCOUNTER — Ambulatory Visit: Payer: No Typology Code available for payment source | Admitting: Podiatry

## 2021-07-21 VITALS — BP 122/70 | Wt 204.0 lb

## 2021-07-21 DIAGNOSIS — E561 Deficiency of vitamin K: Secondary | ICD-10-CM

## 2021-07-21 DIAGNOSIS — Z3482 Encounter for supervision of other normal pregnancy, second trimester: Secondary | ICD-10-CM

## 2021-07-21 DIAGNOSIS — Z3A14 14 weeks gestation of pregnancy: Secondary | ICD-10-CM

## 2021-07-21 NOTE — Progress Notes (Signed)
Routine Prenatal Care Visit  Subjective  Mary Richards is a 35 y.o. G3P1011 at 4w1dbeing seen today for ongoing prenatal care.  She is currently monitored for the following issues for this low-risk pregnancy and has GAD (generalized anxiety disorder); Chronic low back pain; Migraine; Routine physical examination; Drug allergy; Bilateral impacted cerumen; Diarrhea; Encounter for examination of blood pressure with abnormal findings; Ruptured left tubal ectopic pregnancy causing hemoperitoneum; Supervision of other normal pregnancy, antepartum; Asthma during pregnancy; and Obesity affecting pregnancy on their problem list.  ----------------------------------------------------------------------------------- Patient reports no complaints.   Contractions: Not present. Vag. Bleeding: None.  Movement: Present. Denies leaking of fluid.  ----------------------------------------------------------------------------------- The following portions of the patient's history were reviewed and updated as appropriate: allergies, current medications, past family history, past medical history, past social history, past surgical history and problem list. Problem list updated.   Objective  Blood pressure 122/70, weight 204 lb (92.5 kg), last menstrual period 03/30/2021, unknown if currently breastfeeding. Pregravid weight 200 lb (90.7 kg) Total Weight Gain 4 lb (1.814 kg) Urinalysis:      Fetal Status: Fetal Heart Rate (bpm): 150   Movement: Present     General:  Alert, oriented and cooperative. Patient is in no acute distress.  Skin: Skin is warm and dry. No rash noted.   Cardiovascular: Normal heart rate noted  Respiratory: Normal respiratory effort, no problems with respiration noted  Abdomen: Soft, gravid, appropriate for gestational age. Pain/Pressure: Absent     Pelvic:  Cervical exam deferred        Extremities: Normal range of motion.  Edema: None  Mental Status: Normal mood and affect.  Normal behavior. Normal judgment and thought content.     Assessment   35y.o. G3P1011 at 133w1dy  01/18/2022, by Ultrasound presenting for routine prenatal visit  Plan     pregnancy3 Problems (from 05/28/21 to present)     Problem Noted Resolved   Supervision of other normal pregnancy, antepartum 05/28/2021 by JaWill BonnetMD No   Overview Addendum 07/21/2021  8:30 AM by ScHomero FellersMD     Nursing Staff Provider  Office Location  [facility] Dating   6 wk [place]  English Anatomy [place]  Flu Vaccine   Genetic Screen  NIPS: declined  TDaP vaccine    Hgb A1C or  GTT Early : hgba1c 5.1 Third trimester :   Covid Vaccinated   LAB RESULTS   Rhogam  Not needed Blood Type A/Positive/-- (07/15 0921)   Feeding Plan Breast Antibody Negative (07/15 0921)  Contraception condoms Rubella 3.78 (07/15 0921)  Circumcision  RPR Non Reactive (07/15 0921)   Pediatrician  [facility] HBsAg Negative (07/15 0921)   Support Person Husband- Chayne HIV Non Reactive (07/15 0921)  Prenatal Classes Discussed Varicella Immune    GBS     BTL Consent     VBAC Consent  Pap 2020 NIL    Hgb Electro      CF      SMA              Asthma during pregnancy 05/28/2021 by JaWill BonnetMD No   Obesity affecting pregnancy 05/28/2021 by JaWill BonnetMD No      Will check vitamin K level today for questran use in pregnancy  Gestational age appropriate obstetric precautions including but not limited to vaginal bleeding, contractions, leaking of fluid and fetal movement were reviewed in detail with the patient.  Return in about 4 weeks (around 08/18/2021) for ROB viist.  Homero Fellers MD [facility], [facility] 07/21/2021, 8:35 AM

## 2021-07-21 NOTE — Progress Notes (Signed)
No vb. No lof.  

## 2021-07-21 NOTE — Patient Instructions (Signed)

## 2021-07-27 ENCOUNTER — Telehealth: Payer: Self-pay

## 2021-07-27 LAB — VITAMIN K1, SERUM: VITAMIN K1: <0.1 ng/mL — ABNORMAL LOW (ref 0.10–2.20)

## 2021-07-27 NOTE — Telephone Encounter (Signed)
Pt calling; daughter has covid; what to do to keep from getting it?  [phone]  Adv pt to wear mask; keep hands washed; stay 46f away; number given for HD.

## 2021-07-28 ENCOUNTER — Telehealth: Payer: Self-pay | Admitting: Gastroenterology

## 2021-07-28 ENCOUNTER — Telehealth: Payer: Self-pay | Admitting: Family

## 2021-07-28 DIAGNOSIS — E561 Deficiency of vitamin K: Secondary | ICD-10-CM

## 2021-07-28 NOTE — Telephone Encounter (Signed)
Pt. Calling saying that her medication that was prescribed to her has dropped her vitamin k levels, she is [redacted] weeks pregnant. She is requesting a call back as soon as popssible.

## 2021-07-28 NOTE — Telephone Encounter (Signed)
Patient called in stating that her OBGYN told her to contact her primary doctor.Please call her at [phone].

## 2021-07-28 NOTE — Telephone Encounter (Signed)
I called and advised patient to Metamucil 1 TBS 3x daily & stop the Questran. I asked that if this did not help bowels that she contact Dr. Georgeann Oppenheim office. She stated that she would try & she is scheduled for Vitamin K in two weeks.

## 2021-07-28 NOTE — Telephone Encounter (Signed)
Call pt Consulted with Dr. Vicente Males, gastroenterology.  He advised Stop Questran - if diarrhea returns we can try something else   Start Metamucil 1 tablespoon TID  repeat vitamin K in 2 weeks. I have ordered, please sch

## 2021-07-28 NOTE — Telephone Encounter (Signed)
Dr Mary Richards Patient has reached out to both our offices. Currently [redacted] weeks pregnant following with Dr Gilman Schmidt whom collected vitamin K1 and < 0.10.   Dr Gilman Schmidt advised to start oral supplementation with at least 90 mcg.  She was concerned that the Questran may be blocking absorption.  She may require IV or IM vitamin K.  Would appreciate your advice here and hope you are well.  Mary Richards

## 2021-07-28 NOTE — Telephone Encounter (Signed)
Pt called bc Vitamin K lab was so low & she is [redacted] weeks pregnant. Dr. Vicente Males has her on Questran for dumping syndrome & if she doesn't take this at all then she has constant diarrhea. She has been taking half the 4 g pack & Vitamin K is still low. OB/GYN asked that she reach out to is as well as Dr. Vicente Males to see what can be done or if infusions are needed? I asked her to definitely reach out to Dr. Vicente Males as well & I would send to you to get your take on this?

## 2021-07-28 NOTE — Telephone Encounter (Signed)
1. Stop Questran - if diarrhea returns we can try something else 2. Metamucil 1 tablespoon TID- lets see if this will work   Dr Jonathon Bellows MD,MRCP [facility]) Gastroenterology/Hepatology Pager: [phone]

## 2021-07-29 ENCOUNTER — Ambulatory Visit: Payer: No Typology Code available for payment source

## 2021-07-29 MED FILL — Albuterol Sulfate Inhal Aero 108 MCG/ACT (90MCG Base Equiv): RESPIRATORY_TRACT | 25 days supply | Qty: 18 | Fill #0 | Status: AC

## 2021-07-30 ENCOUNTER — Other Ambulatory Visit: Payer: Self-pay

## 2021-08-11 ENCOUNTER — Other Ambulatory Visit (INDEPENDENT_AMBULATORY_CARE_PROVIDER_SITE_OTHER): Payer: No Typology Code available for payment source

## 2021-08-11 ENCOUNTER — Other Ambulatory Visit: Payer: Self-pay

## 2021-08-11 DIAGNOSIS — E561 Deficiency of vitamin K: Secondary | ICD-10-CM | POA: Diagnosis not present

## 2021-08-15 LAB — VITAMIN K1, SERUM: VITAMIN K1: 0.8 ng/mL (ref 0.10–2.20)

## 2021-08-15 LAB — SPECIMEN STATUS REPORT

## 2021-08-19 ENCOUNTER — Encounter: Payer: Self-pay | Admitting: Obstetrics and Gynecology

## 2021-08-19 ENCOUNTER — Other Ambulatory Visit: Payer: Self-pay

## 2021-08-19 ENCOUNTER — Ambulatory Visit (INDEPENDENT_AMBULATORY_CARE_PROVIDER_SITE_OTHER): Payer: No Typology Code available for payment source | Admitting: Obstetrics and Gynecology

## 2021-08-19 VITALS — BP 118/74 | Wt 211.0 lb

## 2021-08-19 DIAGNOSIS — Z3482 Encounter for supervision of other normal pregnancy, second trimester: Secondary | ICD-10-CM

## 2021-08-19 DIAGNOSIS — Z3A18 18 weeks gestation of pregnancy: Secondary | ICD-10-CM

## 2021-08-19 DIAGNOSIS — O99212 Obesity complicating pregnancy, second trimester: Secondary | ICD-10-CM

## 2021-08-19 DIAGNOSIS — O99519 Diseases of the respiratory system complicating pregnancy, unspecified trimester: Secondary | ICD-10-CM

## 2021-08-19 DIAGNOSIS — J45909 Unspecified asthma, uncomplicated: Secondary | ICD-10-CM

## 2021-08-19 NOTE — Progress Notes (Signed)
Routine Prenatal Care Visit  Subjective  Mary Richards is a 35 y.o. G3P1011 at [redacted]w[redacted]d being seen today for ongoing prenatal care.  She is currently monitored for the following issues for this low-risk pregnancy and has GAD (generalized anxiety disorder); Chronic low back pain; Migraine; Routine physical examination; Drug allergy; Bilateral impacted cerumen; Diarrhea; Encounter for examination of blood pressure with abnormal findings; Ruptured left tubal ectopic pregnancy causing hemoperitoneum; Supervision of other normal pregnancy, antepartum; Asthma during pregnancy; and Obesity affecting pregnancy on their problem list.  ----------------------------------------------------------------------------------- Patient reports no complaints.   Contractions: Not present. Vag. Bleeding: None.  Movement: Present. Leaking Fluid denies.  ----------------------------------------------------------------------------------- The following portions of the patient's history were reviewed and updated as appropriate: allergies, current medications, past family history, past medical history, past social history, past surgical history and problem list. Problem list updated.  Objective  Blood pressure 118/74, weight 211 lb (95.7 kg), last menstrual period 03/30/2021, unknown if currently breastfeeding. Pregravid weight 200 lb (90.7 kg) Total Weight Gain 11 lb (4.99 kg) Urinalysis: Urine Protein    Urine Glucose    Fetal Status: Fetal Heart Rate (bpm): 140   Movement: Present     General:  Alert, oriented and cooperative. Patient is in no acute distress.  Skin: Skin is warm and dry. No rash noted.   Cardiovascular: Normal heart rate noted  Respiratory: Normal respiratory effort, no problems with respiration noted  Abdomen: Soft, gravid, appropriate for gestational age. Pain/Pressure: Absent     Pelvic:  Cervical exam deferred        Extremities: Normal range of motion.     Mental Status: Normal mood and  affect. Normal behavior. Normal judgment and thought content.   Assessment   35 y.o. G3P1011 at [redacted]w[redacted]d by  01/18/2022, by Ultrasound presenting for routine prenatal visit  Plan   pregnancy3 Problems (from 05/28/21 to present)     Problem Noted Resolved   Supervision of other normal pregnancy, antepartum 05/28/2021 by Will Bonnet, MD No   Overview Addendum 07/21/2021  8:30 AM by Homero Fellers, MD     Nursing Staff Provider  Office Location  [facility] Dating   6 wk [place]  Language  English Anatomy US    Flu Vaccine   Genetic Screen  NIPS: declined  TDaP vaccine    Hgb A1C or  GTT Early : hgba1c 5.1 Third trimester :   Covid Vaccinated   LAB RESULTS   Rhogam  Not needed Blood Type A/Positive/-- (07/15 0921)   Feeding Plan Breast Antibody Negative (07/15 0921)  Contraception condoms Rubella 3.78 (07/15 0921)  Circumcision  RPR Non Reactive (07/15 0921)   Pediatrician  [facility] HBsAg Negative (07/15 0921)   Support Person Husband- Chayne HIV Non Reactive (07/15 0921)  Prenatal Classes Discussed Varicella Immune    GBS     BTL Consent     VBAC Consent  Pap 2020 NIL    Hgb Electro      CF      SMA              Asthma during pregnancy 05/28/2021 by Will Bonnet, MD No   Obesity affecting pregnancy 05/28/2021 by Will Bonnet, MD No        Preterm labor symptoms and general obstetric precautions including but not limited to vaginal bleeding, contractions, leaking of fluid and fetal movement were reviewed in detail with the patient. Please refer to After Visit Summary for other counseling recommendations.   -  Anatomy u/s scheduled for 10/18, follow up already scheduled.   Return for [place] (Keep previously scheduled appointments).   Prentice Docker, MD, [facility], [facility] 08/19/2021 8:28 AM

## 2021-08-31 ENCOUNTER — Ambulatory Visit
Admission: RE | Admit: 2021-08-31 | Discharge: 2021-08-31 | Disposition: A | Discharge status: Home / Self Care | Payer: No Typology Code available for payment source | Source: Ambulatory Visit | Attending: Obstetrics and Gynecology | Admitting: Obstetrics and Gynecology

## 2021-08-31 ENCOUNTER — Other Ambulatory Visit: Payer: Self-pay

## 2021-08-31 DIAGNOSIS — Z3482 Encounter for supervision of other normal pregnancy, second trimester: Secondary | ICD-10-CM | POA: Insufficient documentation

## 2021-09-02 ENCOUNTER — Encounter: Payer: Self-pay | Admitting: Advanced Practice Midwife

## 2021-09-02 ENCOUNTER — Other Ambulatory Visit: Payer: Self-pay

## 2021-09-02 ENCOUNTER — Ambulatory Visit (INDEPENDENT_AMBULATORY_CARE_PROVIDER_SITE_OTHER): Payer: No Typology Code available for payment source | Admitting: Advanced Practice Midwife

## 2021-09-02 VITALS — BP 122/74 | Wt 213.0 lb

## 2021-09-02 DIAGNOSIS — O4442 Low lying placenta NOS or without hemorrhage, second trimester: Secondary | ICD-10-CM

## 2021-09-02 DIAGNOSIS — Z3A20 20 weeks gestation of pregnancy: Secondary | ICD-10-CM

## 2021-09-02 DIAGNOSIS — Z369 Encounter for antenatal screening, unspecified: Secondary | ICD-10-CM

## 2021-09-02 DIAGNOSIS — Z3482 Encounter for supervision of other normal pregnancy, second trimester: Secondary | ICD-10-CM

## 2021-09-02 NOTE — Progress Notes (Signed)
No vb. No lof. Anatomy u/s f/u

## 2021-09-02 NOTE — Progress Notes (Signed)
Routine Prenatal Care Visit  Subjective  Mary Richards is a 35 y.o. G3P1011 at [redacted]w[redacted]d being seen today for ongoing prenatal care.  She is currently monitored for the following issues for this low-risk pregnancy and has GAD (generalized anxiety disorder); Chronic low back pain; Migraine; Routine physical examination; Drug allergy; Bilateral impacted cerumen; Diarrhea; Encounter for examination of blood pressure with abnormal findings; Ruptured left tubal ectopic pregnancy causing hemoperitoneum; Supervision of other normal pregnancy, antepartum; Asthma during pregnancy; and Obesity affecting pregnancy on their problem list.  ----------------------------------------------------------------------------------- Patient reports  she is doing well. We discussed results of anatomy scan and follow up for low lying placenta .   Contractions: Not present. Vag. Bleeding: None.  Movement: Present. Leaking Fluid denies.  ----------------------------------------------------------------------------------- The following portions of the patient's history were reviewed and updated as appropriate: allergies, current medications, past family history, past medical history, past social history, past surgical history and problem list. Problem list updated.  Objective  Blood pressure 122/74, weight 213 lb (96.6 kg), last menstrual period 03/30/2021, unknown if currently breastfeeding. Pregravid weight 200 lb (90.7 kg) Total Weight Gain 13 lb (5.897 kg) Urinalysis: Urine Protein    Urine Glucose    Fetal Status: Fetal Heart Rate (bpm): 141 Fundal Height: 20 cm Movement: Present     Anatomy scan done 08/31/2021: complete, normal, female, variable/transverse presentation, placenta is anterior/low lying (1.8 cm) from the cervical os  General:  Alert, oriented and cooperative. Patient is in no acute distress.  Skin: Skin is warm and dry. No rash noted.   Cardiovascular: Normal heart rate noted  Respiratory: Normal  respiratory effort, no problems with respiration noted  Abdomen: Soft, gravid, appropriate for gestational age. Pain/Pressure: Absent     Pelvic:  Cervical exam deferred        Extremities: Normal range of motion.  Edema: None  Mental Status: Normal mood and affect. Normal behavior. Normal judgment and thought content.   Assessment   35 y.o. G3P1011 at [redacted]w[redacted]d by  01/18/2022, by Ultrasound presenting for routine prenatal visit  Plan   pregnancy3 Problems (from 05/28/21 to present)     Problem Noted Resolved   Supervision of other normal pregnancy, antepartum 05/28/2021 by Will Bonnet, MD No   Overview Addendum 07/21/2021  8:30 AM by Homero Fellers, MD     Nursing Staff Provider  Office Location  [facility] Dating   6 wk [place]  Language  English Anatomy US    Flu Vaccine   Genetic Screen  NIPS: declined  TDaP vaccine    Hgb A1C or  GTT Early : hgba1c 5.1 Third trimester :   Covid Vaccinated   LAB RESULTS   Rhogam  Not needed Blood Type A/Positive/-- (07/15 0921)   Feeding Plan Breast Antibody Negative (07/15 0921)  Contraception condoms Rubella 3.78 (07/15 0921)  Circumcision  RPR Non Reactive (07/15 0921)   Pediatrician  [facility] HBsAg Negative (07/15 0921)   Support Person Husband- Chayne HIV Non Reactive (07/15 0921)  Prenatal Classes Discussed Varicella Immune    GBS     BTL Consent     VBAC Consent  Pap 2020 NIL    Hgb Electro      CF      SMA               Asthma during pregnancy 05/28/2021 by Will Bonnet, MD No   Obesity affecting pregnancy 05/28/2021 by Will Bonnet, MD No  Preterm labor symptoms and general obstetric precautions including but not limited to vaginal bleeding, contractions, leaking of fluid and fetal movement were reviewed in detail with the patient. Please refer to After Visit Summary for other counseling recommendations.   Return in about 4 weeks (around 09/30/2021) for rob in 4 weeks and f/u anatomy u/s in 8  weeks.  Rod Can, CNM 09/02/2021 11:53 AM

## 2021-09-24 ENCOUNTER — Telehealth: Payer: Self-pay

## 2021-09-24 NOTE — Telephone Encounter (Signed)
Pt calling; has had diarrhea since Wed; anything she can take otc?  [phone]  Pt states it is starting to get more watery; no fever; does feel more tired.  Adv Immodium, Kaopectate, or Donnagel PG; stay hydrated - clear liquids, gatorade, sprite, ginger ale, jello.  If no better Mon to be seen either by [place] or PCP by phone.

## 2021-10-01 ENCOUNTER — Other Ambulatory Visit: Payer: Self-pay

## 2021-10-01 ENCOUNTER — Encounter: Payer: Self-pay | Admitting: Obstetrics and Gynecology

## 2021-10-01 ENCOUNTER — Ambulatory Visit (INDEPENDENT_AMBULATORY_CARE_PROVIDER_SITE_OTHER): Payer: No Typology Code available for payment source | Admitting: Obstetrics and Gynecology

## 2021-10-01 VITALS — BP 120/70 | Wt 210.0 lb

## 2021-10-01 DIAGNOSIS — O99212 Obesity complicating pregnancy, second trimester: Secondary | ICD-10-CM

## 2021-10-01 DIAGNOSIS — Z113 Encounter for screening for infections with a predominantly sexual mode of transmission: Secondary | ICD-10-CM

## 2021-10-01 DIAGNOSIS — Z3A24 24 weeks gestation of pregnancy: Secondary | ICD-10-CM

## 2021-10-01 DIAGNOSIS — Z3480 Encounter for supervision of other normal pregnancy, unspecified trimester: Secondary | ICD-10-CM

## 2021-10-01 DIAGNOSIS — Z131 Encounter for screening for diabetes mellitus: Secondary | ICD-10-CM

## 2021-10-01 NOTE — Progress Notes (Signed)
Routine Prenatal Care Visit  Subjective  Mary Richards is a 35 y.o. G3P1011 at [redacted]w[redacted]d being seen today for ongoing prenatal care.  She is currently monitored for the following issues for this low-risk pregnancy and has GAD (generalized anxiety disorder); Chronic low back pain; Migraine; Routine physical examination; Drug allergy; Bilateral impacted cerumen; Diarrhea; Encounter for examination of blood pressure with abnormal findings; Ruptured left tubal ectopic pregnancy causing hemoperitoneum; Supervision of other normal pregnancy, antepartum; Asthma during pregnancy; and Obesity affecting pregnancy on their problem list.  ----------------------------------------------------------------------------------- Patient reports no complaints.   Contractions: Not present. Vag. Bleeding: None.  Movement: Present. Leaking Fluid denies.  ----------------------------------------------------------------------------------- The following portions of the patient's history were reviewed and updated as appropriate: allergies, current medications, past family history, past medical history, past social history, past surgical history and problem list. Problem list updated.  Objective  Blood pressure 120/70, weight 210 lb (95.3 kg), last menstrual period 03/30/2021, unknown if currently breastfeeding. Pregravid weight 200 lb (90.7 kg) Total Weight Gain 10 lb (4.536 kg) Urinalysis: Urine Protein    Urine Glucose    Fetal Status: Fetal Heart Rate (bpm): 135 Fundal Height: 34 cm Movement: Present     General:  Alert, oriented and cooperative. Patient is in no acute distress.  Skin: Skin is warm and dry. No rash noted.   Cardiovascular: Normal heart rate noted  Respiratory: Normal respiratory effort, no problems with respiration noted  Abdomen: Soft, gravid, appropriate for gestational age. Pain/Pressure: Absent     Pelvic:  Cervical exam deferred        Extremities: Normal range of motion.  Edema: None   Mental Status: Normal mood and affect. Normal behavior. Normal judgment and thought content.   Assessment   35 y.o. G3P1011 at [redacted]w[redacted]d by  01/18/2022, by Ultrasound presenting for routine prenatal visit  Plan   pregnancy3 Problems (from 05/28/21 to present)     Problem Noted Resolved   Supervision of other normal pregnancy, antepartum 05/28/2021 by Will Bonnet, MD No   Overview Addendum 07/21/2021  8:30 AM by Homero Fellers, MD     Nursing Staff Provider  Office Location  [facility] Dating   6 wk [place]  Language  English Anatomy US    Flu Vaccine   Genetic Screen  NIPS: declined  TDaP vaccine    Hgb A1C or  GTT Early : hgba1c 5.1 Third trimester :   Covid Vaccinated   LAB RESULTS   Rhogam  Not needed Blood Type A/Positive/-- (07/15 0921)   Feeding Plan Breast Antibody Negative (07/15 0921)  Contraception condoms Rubella 3.78 (07/15 0921)  Circumcision  RPR Non Reactive (07/15 0921)   Pediatrician  [facility] HBsAg Negative (07/15 0921)   Support Person Husband- Chayne HIV Non Reactive (07/15 0921)  Prenatal Classes Discussed Varicella Immune    GBS     BTL Consent     VBAC Consent  Pap 2020 NIL    Hgb Electro      CF      SMA              Asthma during pregnancy 05/28/2021 by Will Bonnet, MD No   Obesity affecting pregnancy 05/28/2021 by Will Bonnet, MD No        Preterm labor symptoms and general obstetric precautions including but not limited to vaginal bleeding, contractions, leaking of fluid and fetal movement were reviewed in detail with the patient. Please refer to After Visit Summary for other counseling recommendations.   -  keep 28 wk u/s  Return in about 4 weeks (around 10/29/2021) for 28 wk labs with routine prenatal (add 28 wk labs to next visit).   Prentice Docker, MD, [facility], [facility] 10/01/2021 11:21 AM

## 2021-10-11 ENCOUNTER — Encounter: Payer: Self-pay | Admitting: Obstetrics and Gynecology

## 2021-10-18 ENCOUNTER — Ambulatory Visit
Admission: RE | Admit: 2021-10-18 | Discharge: 2021-10-18 | Disposition: A | Discharge status: Home / Self Care | Payer: No Typology Code available for payment source | Source: Ambulatory Visit | Attending: Advanced Practice Midwife | Admitting: Advanced Practice Midwife

## 2021-10-18 DIAGNOSIS — O4443 Low lying placenta NOS or without hemorrhage, third trimester: Secondary | ICD-10-CM | POA: Insufficient documentation

## 2021-10-18 DIAGNOSIS — Z3A26 26 weeks gestation of pregnancy: Secondary | ICD-10-CM | POA: Insufficient documentation

## 2021-10-18 DIAGNOSIS — Z369 Encounter for antenatal screening, unspecified: Secondary | ICD-10-CM

## 2021-10-18 DIAGNOSIS — O4442 Low lying placenta NOS or without hemorrhage, second trimester: Secondary | ICD-10-CM | POA: Diagnosis present

## 2021-10-18 DIAGNOSIS — Z3482 Encounter for supervision of other normal pregnancy, second trimester: Secondary | ICD-10-CM

## 2021-10-19 ENCOUNTER — Encounter: Payer: Self-pay | Admitting: Obstetrics and Gynecology

## 2021-10-25 ENCOUNTER — Other Ambulatory Visit: Payer: Self-pay

## 2021-10-25 ENCOUNTER — Ambulatory Visit (INDEPENDENT_AMBULATORY_CARE_PROVIDER_SITE_OTHER): Payer: No Typology Code available for payment source | Admitting: Obstetrics and Gynecology

## 2021-10-25 ENCOUNTER — Other Ambulatory Visit: Payer: No Typology Code available for payment source

## 2021-10-25 ENCOUNTER — Encounter: Payer: Self-pay | Admitting: Obstetrics and Gynecology

## 2021-10-25 VITALS — BP 108/70 | Ht 66.0 in | Wt 223.4 lb

## 2021-10-25 DIAGNOSIS — Z3480 Encounter for supervision of other normal pregnancy, unspecified trimester: Secondary | ICD-10-CM

## 2021-10-25 DIAGNOSIS — Z113 Encounter for screening for infections with a predominantly sexual mode of transmission: Secondary | ICD-10-CM

## 2021-10-25 DIAGNOSIS — E561 Deficiency of vitamin K: Secondary | ICD-10-CM

## 2021-10-25 DIAGNOSIS — Z3A27 27 weeks gestation of pregnancy: Secondary | ICD-10-CM

## 2021-10-25 DIAGNOSIS — Z131 Encounter for screening for diabetes mellitus: Secondary | ICD-10-CM

## 2021-10-25 LAB — POCT URINALYSIS DIPSTICK OB
Glucose, UA: NEGATIVE
POC,PROTEIN,UA: NEGATIVE

## 2021-10-25 NOTE — Progress Notes (Signed)
Routine Prenatal Care Visit  Subjective  Mary Richards is a 35 y.o. G3P1011 at [redacted]w[redacted]d being seen today for ongoing prenatal care.  She is currently monitored for the following issues for this high-risk pregnancy and has GAD (generalized anxiety disorder); Chronic low back pain; Migraine; Routine physical examination; Drug allergy; Bilateral impacted cerumen; Diarrhea; Encounter for examination of blood pressure with abnormal findings; Ruptured left tubal ectopic pregnancy causing hemoperitoneum; Supervision of other normal pregnancy, antepartum; Asthma during pregnancy; and Obesity affecting pregnancy on their problem list.  ----------------------------------------------------------------------------------- Patient reports  she has started having occasional Braxton hicks contractions, less than 5 a day. She notes a history of PTL at 32 weeks with subsequent 37 week delivery after PROM in her prior pregnancy.     Contractions: Irregular. Vag. Bleeding: None.  Movement: Present. Denies leaking of fluid.  ----------------------------------------------------------------------------------- The following portions of the patient's history were reviewed and updated as appropriate: allergies, current medications, past family history, past medical history, past social history, past surgical history and problem list. Problem list updated.   Objective  Blood pressure 108/70, height 5\' 6"  (1.676 m), weight 223 lb 6.4 oz (101.3 kg), last menstrual period 03/30/2021, unknown if currently breastfeeding. Pregravid weight 200 lb (90.7 kg) Total Weight Gain 23 lb 6.4 oz (10.6 kg) Urinalysis:      Fetal Status:     Movement: Present     General:  Alert, oriented and cooperative. Patient is in no acute distress.  Skin: Skin is warm and dry. No rash noted.   Cardiovascular: Normal heart rate noted  Respiratory: Normal respiratory effort, no problems with respiration noted  Abdomen: Soft, gravid,  appropriate for gestational age. Pain/Pressure: Present     Pelvic:  Cervical exam deferred        Extremities: Normal range of motion.     Mental Status: Normal mood and affect. Normal behavior. Normal judgment and thought content.     Assessment   35 y.o. G3P1011 at [redacted]w[redacted]d by  01/18/2022, by Ultrasound presenting for routine prenatal visit  Plan   pregnancy3 Problems (from 05/28/21 to present)     Problem Noted Resolved   Supervision of other normal pregnancy, antepartum 05/28/2021 by Will Bonnet, MD No   Overview Addendum 10/25/2021 10:10 AM by Homero Fellers, MD     Nursing Staff Provider  Office Location  [facility] Dating   6 wk [place]  Language  English Anatomy US  complete  Flu Vaccine   Genetic Screen  NIPS: declined  TDaP vaccine    Hgb A1C or  GTT Early : hgba1c 5.1 Third trimester :   Covid Vaccinated   LAB RESULTS   Rhogam  Not needed Blood Type A/Positive/-- (07/15 0921)   Feeding Plan Breast Antibody Negative (07/15 0921)  Contraception condoms Rubella 3.78 (07/15 0921)  Circumcision  RPR Non Reactive (07/15 0921)   Pediatrician  [facility] HBsAg Negative (07/15 0921)   Support Person Husband- Chayne HIV Non Reactive (07/15 0921)  Prenatal Classes Discussed Varicella Immune    GBS     BTL Consent     VBAC Consent  Pap 2020 NIL    Hgb Electro      CF      SMA              Asthma during pregnancy 05/28/2021 by Will Bonnet, MD No   Obesity affecting pregnancy 05/28/2021 by Will Bonnet, MD No       Discussed low  threshold for PTL evaluation and FFN testing and risks of prematurity 28 week labs today VITK testing today.   Gestational age appropriate obstetric precautions including but not limited to vaginal bleeding, contractions, leaking of fluid and fetal movement were reviewed in detail with the patient.    Return in about 2 weeks (around 11/08/2021) for ROB in person.  Homero Fellers MD [facility] 10/25/2021, 10:13 AM

## 2021-10-25 NOTE — Patient Instructions (Signed)

## 2021-10-26 LAB — 28 WEEK RH+PANEL
Basophils Absolute: 0.1 10*3/uL (ref 0.0–0.2)
Basos: 1 %
EOS (ABSOLUTE): 0.2 10*3/uL (ref 0.0–0.4)
Eos: 2 %
Gestational Diabetes Screen: 102 mg/dL (ref 70–139)
HIV Screen 4th Generation wRfx: NONREACTIVE
Hematocrit: 38.3 % (ref 34.0–46.6)
Hemoglobin: 13 g/dL (ref 11.1–15.9)
Immature Grans (Abs): 0.4 10*3/uL — ABNORMAL HIGH (ref 0.0–0.1)
Immature Granulocytes: 3 %
Lymphocytes Absolute: 3 10*3/uL (ref 0.7–3.1)
Lymphs: 21 %
MCH: 30.7 pg (ref 26.6–33.0)
MCHC: 33.9 g/dL (ref 31.5–35.7)
MCV: 90 fL (ref 79–97)
Monocytes Absolute: 1 10*3/uL — ABNORMAL HIGH (ref 0.1–0.9)
Monocytes: 7 %
Neutrophils Absolute: 9.5 10*3/uL — ABNORMAL HIGH (ref 1.4–7.0)
Neutrophils: 66 %
Platelets: 292 10*3/uL (ref 150–450)
RBC: 4.24 x10E6/uL (ref 3.77–5.28)
RDW: 12 % (ref 11.7–15.4)
RPR Ser Ql: NONREACTIVE
WBC: 14.2 10*3/uL — ABNORMAL HIGH (ref 3.4–10.8)

## 2021-10-29 LAB — VITAMIN K1, SERUM: VITAMIN K1: 0.34 ng/mL (ref 0.10–2.20)

## 2021-11-09 ENCOUNTER — Encounter: Payer: Self-pay | Admitting: Licensed Practical Nurse

## 2021-11-11 ENCOUNTER — Ambulatory Visit (INDEPENDENT_AMBULATORY_CARE_PROVIDER_SITE_OTHER): Payer: No Typology Code available for payment source | Admitting: Licensed Practical Nurse

## 2021-11-11 ENCOUNTER — Other Ambulatory Visit: Payer: Self-pay

## 2021-11-11 ENCOUNTER — Encounter: Payer: Self-pay | Admitting: Licensed Practical Nurse

## 2021-11-11 VITALS — BP 122/70 | Wt 226.0 lb

## 2021-11-11 DIAGNOSIS — Z3A30 30 weeks gestation of pregnancy: Secondary | ICD-10-CM

## 2021-11-11 DIAGNOSIS — Z23 Encounter for immunization: Secondary | ICD-10-CM | POA: Diagnosis not present

## 2021-11-11 DIAGNOSIS — Z3480 Encounter for supervision of other normal pregnancy, unspecified trimester: Secondary | ICD-10-CM

## 2021-11-11 DIAGNOSIS — Z8759 Personal history of other complications of pregnancy, childbirth and the puerperium: Secondary | ICD-10-CM

## 2021-11-11 DIAGNOSIS — Z8659 Personal history of other mental and behavioral disorders: Secondary | ICD-10-CM | POA: Diagnosis not present

## 2021-11-11 MED ORDER — SERTRALINE HCL 25 MG PO TABS
25.0000 mg | ORAL_TABLET | Freq: Every day | ORAL | 1 refills | Status: DC
  Filled 2021-11-11: qty 52, 26d supply, fill #0
  Filled 2022-01-29: qty 52, 26d supply, fill #1

## 2021-11-11 NOTE — Progress Notes (Signed)
No vb. No lof. Pt having more braxton hicks

## 2021-11-11 NOTE — Progress Notes (Signed)
Routine Prenatal Care Visit  Subjective  Mary Richards is a 35 y.o. G3P1011 at [redacted]w[redacted]d being seen today for ongoing prenatal care.  She is currently monitored for the following issues for this low-risk pregnancy and has GAD (generalized anxiety disorder); Chronic low back pain; Migraine; Routine physical examination; Drug allergy; Bilateral impacted cerumen; Diarrhea; Encounter for examination of blood pressure with abnormal findings; Ruptured left tubal ectopic pregnancy causing hemoperitoneum; Supervision of other normal pregnancy, antepartum; Asthma during pregnancy; and Obesity affecting pregnancy on their problem list.  ----------------------------------------------------------------------------------- Patient reports tends to get B-H at night, occasionally they are painful but they never occur in any pattern.  Walks 5-7 miles a day at work and is well hydrated. Wears a support belt. Aware of the importance of coming in if there is ever a pattern to her contractions. Had PPD with last child, was prescribed medication. Desires to start medication near the time of the birth, Script for Zoloft sent. Was told she has bursitis, will get a cortisone shot.    Contractions: Irregular. Vag. Bleeding: None.  Movement: Present. Leaking Fluid denies.  ----------------------------------------------------------------------------------- The following portions of the patient's history were reviewed and updated as appropriate: allergies, current medications, past family history, past medical history, past social history, past surgical history and problem list. Problem list updated.  Objective  Blood pressure 122/70, weight 226 lb (102.5 kg), last menstrual period 03/30/2021, unknown if currently breastfeeding. Pregravid weight 200 lb (90.7 kg) Total Weight Gain 26 lb (11.8 kg) Urinalysis: Urine Protein    Urine Glucose    Fetal Status: Fetal Heart Rate (bpm): 135 Fundal Height: 32 cm Movement: Present      General:  Alert, oriented and cooperative. Patient is in no acute distress.  Skin: Skin is warm and dry. No rash noted.   Cardiovascular: Normal heart rate noted  Respiratory: Normal respiratory effort, no problems with respiration noted  Abdomen: Soft, gravid, appropriate for gestational age. Pain/Pressure: Absent     Pelvic:  Cervical exam performed        Extremities: Normal range of motion.     Mental Status: Normal mood and affect. Normal behavior. Normal judgment and thought content.   Assessment   35 y.o. G3P1011 at [redacted]w[redacted]d by  01/18/2022, by Ultrasound presenting for routine prenatal visit  Plan   pregnancy3 Problems (from 05/28/21 to present)     Problem Noted Resolved   Supervision of other normal pregnancy, antepartum 05/28/2021 by Will Bonnet, MD No   Overview Addendum 11/11/2021 10:36 AM by Allen Derry, CNM     Nursing Staff Provider  Office Location  [facility] Dating   6 wk [place]  Language  English Anatomy US  complete  Flu Vaccine   Genetic Screen  NIPS: declined  TDaP vaccine   11/11/21 Hgb A1C or  GTT Early : hgba1c 5.1 Third trimester :   Covid Vaccinated   LAB RESULTS   Rhogam  Not needed Blood Type A/Positive/-- (07/15 0921)   Feeding Plan Breast Antibody Negative (07/15 0921)  Contraception condoms Rubella 3.78 (07/15 0921)  Circumcision yes RPR Non Reactive (07/15 0921)   Pediatrician  [facility] HBsAg Negative (07/15 0921)   Support Person Husband- Chayne HIV Non Reactive (07/15 1638)  Prenatal Classes Discussed Varicella Immune    GBS     BTL Consent     VBAC Consent  Pap 2020 NIL    Hgb Electro      CF      SMA  Asthma during pregnancy 05/28/2021 by Will Bonnet, MD No   Obesity affecting pregnancy 05/28/2021 by Will Bonnet, MD No        Preterm labor symptoms and general obstetric precautions including but not limited to vaginal bleeding, contractions, leaking of fluid and fetal movement were  reviewed in detail with the patient. Please refer to After Visit Summary for other counseling recommendations.   HX of PPD  Script for Zoloft sent to be started either around 36 weeks or at time of birth Letter for Madaline Savage Duty given   Return in about 2 weeks (around 11/25/2021) for [place].  Roberto Scales, CNM  [facility], [facility]  11/11/21  10:42 AM

## 2021-11-12 ENCOUNTER — Other Ambulatory Visit: Payer: Self-pay | Admitting: Obstetrics and Gynecology

## 2021-11-12 DIAGNOSIS — G2581 Restless legs syndrome: Secondary | ICD-10-CM

## 2021-11-12 DIAGNOSIS — O99013 Anemia complicating pregnancy, third trimester: Secondary | ICD-10-CM

## 2021-11-12 NOTE — Progress Notes (Signed)
Labs for restless leg ordered  Adrian Prows MD, [facility], [facility] 11/12/2021 5:04 PM

## 2021-11-14 NOTE — L&D Delivery Note (Signed)
Vaginal Delivery Note ? ?Spontaneous delivery of live viable female infant from the LOA position through an intact perineum. Delivery of anterior right shoulder with gentle downward guidance followed by delivery of the left posterior shoulder with gentle upward guidance. Body followed spontaneously. Infant placed on maternal chest. Nursery present and helped with neonatal resuscitation and evaluation. Cord clamped and cut after one minute. Cord blood collected. Placenta delivered spontaneously and intact with a 3 vessel cord .  Bilateral periurethral and 2nd degree perineal laceration. Uterus firm and below umbilicus at the end of the delivery.  Mom and baby recovering in stable condition. Sponge and needle counts were correct at the end of the delivery. ? ?APGARS: 1 minute:8 5 minutes: 9 ?Weight: pending ?Epidural present ?Laceration: Bilateral periurethral and 2nd degree perineal laceration ? ?Adrian Prows MD ?[facility], [facility] ?01/15/22 ?6:52 AM ? ?

## 2021-11-15 ENCOUNTER — Encounter: Payer: Self-pay | Admitting: Family

## 2021-11-16 ENCOUNTER — Ambulatory Visit (INDEPENDENT_AMBULATORY_CARE_PROVIDER_SITE_OTHER): Payer: No Typology Code available for payment source | Admitting: Licensed Practical Nurse

## 2021-11-16 ENCOUNTER — Other Ambulatory Visit: Payer: Self-pay

## 2021-11-16 VITALS — BP 114/60 | Wt 229.0 lb

## 2021-11-16 DIAGNOSIS — Z3490 Encounter for supervision of normal pregnancy, unspecified, unspecified trimester: Secondary | ICD-10-CM

## 2021-11-16 DIAGNOSIS — Z3A31 31 weeks gestation of pregnancy: Secondary | ICD-10-CM

## 2021-11-16 DIAGNOSIS — G2581 Restless legs syndrome: Secondary | ICD-10-CM

## 2021-11-16 DIAGNOSIS — O99013 Anemia complicating pregnancy, third trimester: Secondary | ICD-10-CM

## 2021-11-16 DIAGNOSIS — Z3480 Encounter for supervision of other normal pregnancy, unspecified trimester: Secondary | ICD-10-CM

## 2021-11-16 NOTE — Progress Notes (Signed)
C/o restless leg is really getting to her this preg - was on gabapentin before.

## 2021-11-16 NOTE — Progress Notes (Signed)
Routine Prenatal Care Visit  Subjective  Mary Richards is a 36 y.o. G3P1011 at [redacted]w[redacted]d being seen today for ongoing prenatal care.  She is currently monitored for the following issues for this low-risk pregnancy and has GAD (generalized anxiety disorder); Chronic low back pain; Migraine; Routine physical examination; Drug allergy; Bilateral impacted cerumen; Diarrhea; Encounter for examination of blood pressure with abnormal findings; Ruptured left tubal ectopic pregnancy causing hemoperitoneum; Supervision of other normal pregnancy, antepartum; Asthma during pregnancy; and Obesity affecting pregnancy on their problem list.  ----------------------------------------------------------------------------------- Patient reports Restless legs are keeping her up at night which makes her tired during the day-she able to nap on her days off. Otherwise no complaints.  Wearing compression stockings while at work.    .  .   Jacklyn Shell Fluid denies.  ----------------------------------------------------------------------------------- The following portions of the patient's history were reviewed and updated as appropriate: allergies, current medications, past family history, past medical history, past social history, past surgical history and problem list. Problem list updated.  Objective  Blood pressure 114/60, weight 229 lb (103.9 kg), last menstrual period 03/30/2021, unknown if currently breastfeeding. Pregravid weight 200 lb (90.7 kg) Total Weight Gain 29 lb (13.2 kg) Urinalysis: Urine Protein    Urine Glucose    Fetal Status: Fetal Heart Rate (bpm): 130 Fundal Height: 33 cm Movement: Present     General:  Alert, oriented and cooperative. Patient is in no acute distress.  Skin: Skin is warm and dry. No rash noted.   Cardiovascular: Normal heart rate noted  Respiratory: Normal respiratory effort, no problems with respiration noted  Abdomen: Soft, gravid, appropriate for gestational age.        Pelvic:  Cervical exam deferred        Extremities: Normal range of motion.     Mental Status: Normal mood and affect. Normal behavior. Normal judgment and thought content.   Assessment   36 y.o. G3P1011 at [redacted]w[redacted]d by  01/18/2022, by Ultrasound presenting for routine prenatal visit  Plan   pregnancy3 Problems (from 05/28/21 to present)     Problem Noted Resolved   Supervision of other normal pregnancy, antepartum 05/28/2021 by Will Bonnet, MD No   Overview Addendum 11/11/2021 10:44 AM by Allen Derry, [place] Staff Provider  Office Location  [facility] Dating   6 wk [place]  Language  English Anatomy US  complete  Flu Vaccine  08/24/21 Genetic Screen  NIPS: declined  TDaP vaccine   11/11/21 Hgb A1C or  GTT Early : hgba1c 5.1 Third trimester :   Covid Vaccinated   LAB RESULTS   Rhogam  Not needed Blood Type A/Positive/-- (07/15 0921)   Feeding Plan Breast Antibody Negative (07/15 0921)  Contraception condoms Rubella 3.78 (07/15 0921)  Circumcision yes RPR Non Reactive (07/15 0921)   Pediatrician  [facility] HBsAg Negative (07/15 0921)   Support Person Husband- Chayne HIV Non Reactive (07/15 8588)  Prenatal Classes Discussed Varicella Immune    GBS     BTL Consent     VBAC Consent  Pap 2020 NIL    Hgb Electro      CF      SMA         Hx of PPD: Script for zoloft given                     Needs 2 wk PP visit         Asthma during pregnancy 05/28/2021 by  Will Bonnet, MD No   Obesity affecting pregnancy 05/28/2021 by Will Bonnet, MD No        Preterm labor symptoms and general obstetric precautions including but not limited to vaginal bleeding, contractions, leaking of fluid and fetal movement were reviewed in detail with the patient. Please refer to After Visit Summary for other counseling recommendations.   Labs ordered by Dr Gilman Schmidt collected  RTC in 2 weeks   Roberto Scales, [place], [facility]   11/16/21  4:23 PM

## 2021-11-17 ENCOUNTER — Encounter: Payer: Self-pay | Admitting: Obstetrics and Gynecology

## 2021-11-17 LAB — IRON AND TIBC
Iron Saturation: 14 % — ABNORMAL LOW (ref 15–55)
Iron: 57 ug/dL (ref 27–159)
Total Iron Binding Capacity: 416 ug/dL (ref 250–450)
UIBC: 359 ug/dL (ref 131–425)

## 2021-11-17 LAB — FERRITIN: Ferritin: 21 ng/mL (ref 15–150)

## 2021-11-17 LAB — VITAMIN B12: Vitamin B-12: 428 pg/mL (ref 232–1245)

## 2021-11-18 NOTE — Telephone Encounter (Signed)
Please advise 

## 2021-12-01 ENCOUNTER — Other Ambulatory Visit: Payer: Self-pay | Admitting: Physician Assistant

## 2021-12-01 DIAGNOSIS — R0981 Nasal congestion: Secondary | ICD-10-CM

## 2021-12-02 ENCOUNTER — Ambulatory Visit (INDEPENDENT_AMBULATORY_CARE_PROVIDER_SITE_OTHER): Payer: No Typology Code available for payment source | Admitting: Advanced Practice Midwife

## 2021-12-02 ENCOUNTER — Observation Stay
Admission: EM | Admit: 2021-12-02 | Discharge: 2021-12-02 | Disposition: A | Discharge status: Home / Self Care | Payer: No Typology Code available for payment source | Attending: Obstetrics & Gynecology | Admitting: Obstetrics & Gynecology

## 2021-12-02 ENCOUNTER — Encounter: Payer: Self-pay | Admitting: Advanced Practice Midwife

## 2021-12-02 ENCOUNTER — Other Ambulatory Visit: Payer: Self-pay

## 2021-12-02 ENCOUNTER — Encounter: Payer: Self-pay | Admitting: Obstetrics & Gynecology

## 2021-12-02 DIAGNOSIS — O99513 Diseases of the respiratory system complicating pregnancy, third trimester: Secondary | ICD-10-CM | POA: Insufficient documentation

## 2021-12-02 DIAGNOSIS — Z3A33 33 weeks gestation of pregnancy: Secondary | ICD-10-CM

## 2021-12-02 DIAGNOSIS — O368130 Decreased fetal movements, third trimester, not applicable or unspecified: Principal | ICD-10-CM | POA: Insufficient documentation

## 2021-12-02 DIAGNOSIS — Z79899 Other long term (current) drug therapy: Secondary | ICD-10-CM | POA: Insufficient documentation

## 2021-12-02 DIAGNOSIS — Z3483 Encounter for supervision of other normal pregnancy, third trimester: Secondary | ICD-10-CM

## 2021-12-02 DIAGNOSIS — Z3480 Encounter for supervision of other normal pregnancy, unspecified trimester: Secondary | ICD-10-CM

## 2021-12-02 DIAGNOSIS — J45909 Unspecified asthma, uncomplicated: Secondary | ICD-10-CM | POA: Insufficient documentation

## 2021-12-02 DIAGNOSIS — O368190 Decreased fetal movements, unspecified trimester, not applicable or unspecified: Secondary | ICD-10-CM | POA: Diagnosis present

## 2021-12-02 NOTE — Progress Notes (Signed)
Routine Prenatal Care Visit- Virtual Visit  Subjective   Virtual Visit via Telephone Note  I connected with [name] on 12/02/21 at  8:15 AM EST by telephone and verified that I am speaking with the correct person using two identifiers.   I discussed the limitations, risks, security and privacy concerns of performing an evaluation and management service by telephone and the availability of in person appointments. I also discussed with the patient that there may be a patient responsible charge related to this service. The patient expressed understanding and agreed to proceed.  The patient was at home I spoke with the patient from my  Office phone The names of people involved in this encounter were: [name] and myself Rod Can, CNM   Mary Richards is a 36 y.o. G3P1011 at [redacted]w[redacted]d being seen today for ongoing prenatal care.  She is currently monitored for the following issues for this low-risk pregnancy and has GAD (generalized anxiety disorder); Chronic low back pain; Bilateral impacted cerumen; Encounter for examination of blood pressure with abnormal findings; Supervision of other normal pregnancy, antepartum; Asthma during pregnancy; and Obesity affecting pregnancy on their problem list.  ----------------------------------------------------------------------------------- Patient reports Covid symptoms began yesterday. She has headache, body aches, chills, fever.   Contractions: Irritability. Vag. Bleeding: None.  Movement: Present. Denies leaking of fluid.  ----------------------------------------------------------------------------------- The following portions of the patient's history were reviewed and updated as appropriate: allergies, current medications, past family history, past medical history, past social history, past surgical history and problem list. Problem list updated.   Objective  Last menstrual period 03/30/2021 Pregravid weight 200 lb (90.7 kg) Total  Weight Gain 29 lb (13.2 kg) Urinalysis:      Fetal Status:     Movement: Present     Physical Exam could not be performed. Because of the COVID-19 outbreak this visit was performed over the phone and not in person.   Assessment   36 y.o. G3P1011 at [redacted]w[redacted]d by  01/18/2022, by Ultrasound presenting for routine prenatal visit  Plan   pregnancy3 Problems (from 05/28/21 to present)    Problem Noted Resolved   Supervision of other normal pregnancy, antepartum 05/28/2021 by Will Bonnet, MD No   Overview Addendum 11/16/2021  4:23 PM by Allen Derry, [place] Staff Provider  Office Location  [facility] Dating   6 wk [place]  Language  English Anatomy US  complete  Flu Vaccine  08/24/21 Genetic Screen  NIPS: declined  TDaP vaccine   11/11/21 Hgb A1C or  GTT Early : hgba1c 5.1 Third trimester :   Covid Vaccinated   LAB RESULTS   Rhogam  Not needed Blood Type A/Positive/-- (07/15 0921)   Feeding Plan Breast Antibody Negative (07/15 0921)  Contraception condoms Rubella 3.78 (07/15 0921)  Circumcision yes RPR Non Reactive (07/15 0921)   Pediatrician  [facility] HBsAg Negative (07/15 0921)   Support Person Husband- Chayne HIV Non Reactive (07/15 8921)  Prenatal Classes Discussed Varicella Immune    GBS     BTL Consent     VBAC Consent  Pap 2020 NIL    Hgb Electro      CF      SMA         Hx of PPD: Script for zoloft given                     Needs 2 wk PP visit  Asthma during pregnancy 05/28/2021 by Will Bonnet, MD No   Obesity affecting pregnancy 05/28/2021 by Will Bonnet, MD No       Gestational age appropriate obstetric precautions including but not limited to vaginal bleeding, contractions, leaking of fluid and fetal movement were reviewed in detail with the patient.     Follow Up Instructions: Increase Rest and Hydration Safe OTC meds for symptom relief Go to ED for severe symptoms   I discussed the assessment and treatment plan with  the patient. The patient was provided an opportunity to ask questions and all were answered. The patient agreed with the plan and demonstrated an understanding of the instructions.   The patient was advised to call back or seek an in-person evaluation if the symptoms worsen or if the condition fails to improve as anticipated.  I provided 10 minutes of non-face-to-face time during this encounter.  Return in about 2 weeks (around 12/16/2021) for rob.  Christean Leaf, CNM [facility] 12/02/21, 8:28 AM

## 2021-12-02 NOTE — OB Triage Note (Signed)
Pt is a G3P1 at [redacted]w[redacted]d complaining of decreased FM. Pt endorses testing positive for covid this morning and experiencing SOB, fever, nausea, diarrhea, body aches. Pt took tylenol 1000mg  around 1700. Pt denies vaginal bleeding, LOF. VSS. Monitors applied and assessing.

## 2021-12-02 NOTE — Discharge Summary (Signed)
° °  Please see Final Progress Note.  Imagene Riches, CNM  12/02/2021 9:58 PM

## 2021-12-02 NOTE — OB Triage Note (Signed)
Pt discharged home in stable condition per CNM orders. Discharge instructions provided and pt verbalizes understanding.

## 2021-12-02 NOTE — Final Progress Note (Signed)
Final Progress Note  Patient ID: Mary Richards MRN: [id number] DOB/AGE: [birth year] 36 y.o.  Admit date: 12/02/2021 Admitting provider: Imagene Riches, CNM Discharge date: 12/02/2021   Admission Diagnoses: decreased fetal movement in third trimester  Discharge Diagnoses:  Principal Problem:   Decreased fetal movement  Reactive fetal heart tones  History of Present Illness: The patient is a 36 y.o. female G3P1011 at [redacted]w[redacted]d who presents for concerns that she has not felt adequate fetal movement this evening. She was recently diagnosed with COVID, and had called the answering service to inquire about medication use. When she could not confirm feeling 10 movements over the last hour during her phone discussion with the telehealth service,she was instructed to go to the hospital. She denies any contractions, denies any LOF or vaginal bleeding. She does not have a fever.  Past Medical History:  Diagnosis Date   Asthma    Chronic migraine    Complication of anesthesia    DDD (degenerative disc disease), lumbosacral    Family history of adverse reaction to anesthesia    Mother and Father - PONV   PONV (postoperative nausea and vomiting)    Ruptured left tubal ectopic pregnancy causing hemoperitoneum 01/23/2021   Wears contact lenses     Past Surgical History:  Procedure Laterality Date   CHOLECYSTECTOMY     COLONOSCOPY WITH PROPOFOL N/A 02/19/2021   Procedure: COLONOSCOPY WITH PROPOFOL;  Surgeon: Jonathon Bellows, MD;  Location: [facility] ENDOSCOPY;  Service: Gastroenterology;  Laterality: N/A;   DIAGNOSTIC LAPAROSCOPY WITH REMOVAL OF ECTOPIC PREGNANCY Left 01/23/2021   Procedure: DIAGNOSTIC LAPAROSCOPY WITH REMOVAL OF ECTOPIC PREGNANCY;  Surgeon: Will Bonnet, MD;  Location: [facility] ORS;  Service: Gynecology;  Laterality: Left;   PILONIDAL CYST EXCISION     TONSILECTOMY, ADENOIDECTOMY, BILATERAL MYRINGOTOMY AND TUBES     TYMPANOPLASTY Right 05/10/2019   Procedure: TYMPANOPLASTY;   Surgeon: Beverly Gust, MD;  Location: [place];  Service: ENT;  Laterality: Right;    No current facility-administered medications on file prior to encounter.   Current Outpatient Medications on File Prior to Encounter  Medication Sig Dispense Refill   cetirizine (ZYRTEC) 10 MG tablet Take 10 mg by mouth daily.     ferrous sulfate 325 (65 FE) MG tablet Take 325 mg by mouth daily with breakfast.     Prenatal Vit-Fe Fumarate-FA (PRENATAL VITAMIN) 27-0.8 MG TABS Take 1 tablet by mouth daily.     albuterol (VENTOLIN HFA) 108 (90 Base) MCG/ACT inhaler INHALE 2 PUFFS INTO THE LUNGS EVERY 6 HOURS AS NEEDED FOR WHEEZING OR SHORTNESS OF BREATH. 18 g 1   azelastine (ASTELIN) 0.1 % nasal spray Place 1 spray into both nostrils 2 (two) times daily. Use in each nostril as directed (Patient not taking: Reported on 12/02/2021) 30 mL 0   Saccharomyces boulardii (PROBIOTIC) 250 MG CAPS TAKE 1 CAPSULE BY MOUTH 2 (TWO) TIMES DAILY. (Patient not taking: Reported on 10/25/2021) 120 capsule 1   sertraline (ZOLOFT) 25 MG tablet Take 1 tablet (25 mg total) by mouth daily. Take 25mg  daily for 1 to 2 weeks.  May increases to 50 mg (2 tablets) daily if needed 52 tablet 1    Allergies  Allergen Reactions   Azithromycin Hives, Shortness Of Breath and Swelling    Throat swelling   Sulfa Antibiotics Hives   Ciprofloxacin Swelling   Moxifloxacin Hcl Hives   Bactrim [Sulfamethoxazole-Trimethoprim] Rash   Wound Dressing Adhesive Rash    Dermabond    Social History  Socioeconomic History   Marital status: Married    Spouse name: Administrator   Number of children: Not on file   Years of education: Not on file   Highest education level: Not on file  Occupational History   Not on file  Tobacco Use   Smoking status: Never   Smokeless tobacco: Never  Vaping Use   Vaping Use: Never used  Substance and Sexual Activity   Alcohol use: Yes    Alcohol/week: 2.0 standard drinks    Types: 2 Standard  drinks or equivalent per week    Comment: Social    Drug use: Never   Sexual activity: Yes    Birth control/protection: Other-see comments    Comment: Unknown  Other Topics Concern   Not on file  Social History Narrative   Works as Designer, multimedia in ICU   2 children   Social Determinants of Radio broadcast assistant Strain: Not on file  Food Insecurity: Not on file  Transportation Needs: Not on file  Physical Activity: Not on file  Stress: Not on file  Social Connections: Not on file  Intimate Partner Violence: Not on file    Family History  Problem Relation Age of Onset   Brain cancer Father 61   Valvular heart disease Sister    Brain cancer Paternal Grandmother 79   COPD Maternal Grandfather    Irritable bowel syndrome Mother    Thyroid cancer Neg Hx    Colon cancer Neg Hx      Review of Systems  Constitutional: Negative.   HENT: Negative.    Eyes: Negative.   Cardiovascular: Negative.   Gastrointestinal: Negative.   Skin: Negative.   All other systems reviewed and are negative.   Physical Exam: BP 132/60 (BP Location: Right Arm)    Pulse (!) 107    Temp 98.4 F (36.9 C) (Oral)    Resp 18    Ht 5\' 7"  (1.702 m)    Wt 101.6 kg    LMP 03/30/2021    BMI 35.08 kg/m   Physical Exam Vitals and nursing note reviewed.    Consults: None  Significant Findings/ Diagnostic Studies: none  Procedures: NST NST Baseline FHR: 125-130 beats/min Variability: moderate Accelerations: present Decelerations: absent Tocometry: some mild (undetected by the patient) contractions noted.  Interpretation:  INDICATIONS: decreased fetal movement and patient reassurance RESULTS:  A NST procedure was performed with FHR monitoring and a normal baseline established, appropriate time of 20-40 minutes of evaluation, and accels >2 seen w 15x15 characteristics.  Results show a REACTIVE NST.    Hospital Course: The patient was admitted to Labor and Delivery Triage for observation. Her VS were  checked and she was placed on the fetal monitor. The NST was reactive , with a Category 1 tracing. She began to notice/recognize fetal movement once she heard audible sounds on the EFM, and with this reassurance and after some po fluid hydration, she was discharged home with plans for follow up at [facility] at her next [facility] appointment.  Discharge Condition: good  Disposition: Discharge disposition: 01-Home or Self Care       Diet: Regular diet  Discharge Activity: Activity as tolerated  Discharge Instructions     Notify physician for a general feeling that "something is not right"   Complete by: As directed    Notify physician for increase or change in vaginal discharge   Complete by: As directed    Notify physician for intestinal cramps, with or without diarrhea,  sometimes described as "gas pain"   Complete by: As directed    Notify physician for leaking of fluid   Complete by: As directed    Notify physician for low, dull backache, unrelieved by heat or Tylenol   Complete by: As directed    Notify physician for menstrual like cramps   Complete by: As directed    Notify physician for pelvic pressure   Complete by: As directed    Notify physician for uterine contractions.  These may be painless and feel like the uterus is tightening or the baby is  "balling up"   Complete by: As directed    Notify physician for vaginal bleeding   Complete by: As directed    PRETERM LABOR:  Includes any of the follwing symptoms that occur between 20 - [redacted] weeks gestation.  If these symptoms are not stopped, preterm labor can result in preterm delivery, placing your baby at risk   Complete by: As directed       Allergies as of 12/02/2021       Reactions   Azithromycin Hives, Shortness Of Breath, Swelling   Throat swelling   Sulfa Antibiotics Hives   Ciprofloxacin Swelling   Moxifloxacin Hcl Hives   Bactrim [sulfamethoxazole-trimethoprim] Rash   Wound Dressing Adhesive Rash   Dermabond         Medication List     TAKE these medications    albuterol 108 (90 Base) MCG/ACT inhaler Commonly known as: VENTOLIN HFA INHALE 2 PUFFS INTO THE LUNGS EVERY 6 HOURS AS NEEDED FOR WHEEZING OR SHORTNESS OF BREATH.   azelastine 0.1 % nasal spray Commonly known as: ASTELIN Place 1 spray into both nostrils 2 (two) times daily. Use in each nostril as directed   cetirizine 10 MG tablet Commonly known as: ZYRTEC Take 10 mg by mouth daily.   ferrous sulfate 325 (65 FE) MG tablet Take 325 mg by mouth daily with breakfast.   Prenatal Vitamin 27-0.8 MG Tabs Take 1 tablet by mouth daily.   Probiotic 250 MG Caps TAKE 1 CAPSULE BY MOUTH 2 (TWO) TIMES DAILY.   sertraline 25 MG tablet Commonly known as: Zoloft Take one tablet by mouth daily for 1 to 2 weeks. May increase to 2 tablets (50mg ) daily if needed (Take 1 tablet (25 mg total) by mouth daily. Take 25mg  daily for 1 to 2 weeks.  May increases to 50 mg (2 tablets) daily if needed)         Total time spent taking care of this patient: 25 minutes  Signed: Imagene Riches, CNM  12/02/2021, 10:10 PM

## 2021-12-06 ENCOUNTER — Telehealth: Payer: Self-pay

## 2021-12-06 NOTE — Telephone Encounter (Signed)
Pt calling; tested positive for covid last Thurs; what can she take for sinus pressure?  [phone]

## 2021-12-06 NOTE — Telephone Encounter (Signed)
Too late for Paxlovid now.  Sudafed for congestion.

## 2021-12-06 NOTE — Telephone Encounter (Signed)
Pt aware she can take plain sudafed, e.s. tylenol, plain robitussin/plain musinex, Hall's cough drops, sip hot beverages/soup - NO red raspberry tea, push fluids and rest.

## 2021-12-14 ENCOUNTER — Other Ambulatory Visit: Payer: Self-pay

## 2021-12-14 ENCOUNTER — Other Ambulatory Visit: Payer: Self-pay | Admitting: Physician Assistant

## 2021-12-14 DIAGNOSIS — R0981 Nasal congestion: Secondary | ICD-10-CM

## 2021-12-14 MED ORDER — AZELASTINE HCL 0.1 % NA SOLN
1.0000 | Freq: Two times a day (BID) | NASAL | 3 refills | Status: DC
  Filled 2021-12-14: qty 30, 30d supply, fill #0

## 2021-12-16 ENCOUNTER — Other Ambulatory Visit: Payer: Self-pay

## 2021-12-16 ENCOUNTER — Encounter: Payer: Self-pay | Admitting: Obstetrics & Gynecology

## 2021-12-16 ENCOUNTER — Ambulatory Visit (INDEPENDENT_AMBULATORY_CARE_PROVIDER_SITE_OTHER): Payer: No Typology Code available for payment source | Admitting: Obstetrics & Gynecology

## 2021-12-16 VITALS — BP 100/68 | Wt 226.0 lb

## 2021-12-16 DIAGNOSIS — Z8759 Personal history of other complications of pregnancy, childbirth and the puerperium: Secondary | ICD-10-CM

## 2021-12-16 DIAGNOSIS — O99213 Obesity complicating pregnancy, third trimester: Secondary | ICD-10-CM

## 2021-12-16 DIAGNOSIS — Z3A35 35 weeks gestation of pregnancy: Secondary | ICD-10-CM

## 2021-12-16 DIAGNOSIS — Z8659 Personal history of other mental and behavioral disorders: Secondary | ICD-10-CM

## 2021-12-16 DIAGNOSIS — Z3483 Encounter for supervision of other normal pregnancy, third trimester: Secondary | ICD-10-CM

## 2021-12-16 LAB — POCT URINALYSIS DIPSTICK OB
Glucose, UA: NEGATIVE
POC,PROTEIN,UA: NEGATIVE

## 2021-12-16 NOTE — Addendum Note (Signed)
Addended by: Quintella Baton D on: 12/16/2021 10:49 AM   Modules accepted: Orders

## 2021-12-16 NOTE — Patient Instructions (Signed)

## 2021-12-16 NOTE — Progress Notes (Signed)
°  Subjective  Fetal Movement? yes Contractions? no Leaking Fluid? no Vaginal Bleeding? no  Objective  BP 100/68    Wt 226 lb (102.5 kg)    LMP 03/30/2021    BMI 35.40 kg/m  General: NAD Pumonary: no increased work of breathing Abdomen: gravid, non-tender Extremities: no edema Psychiatric: mood appropriate, affect full  Assessment  36 y.o. G3P1011 at [redacted]w[redacted]d by  01/18/2022, by Ultrasound presenting for routine prenatal visit  Plan   Problem List Items Addressed This Visit      Other   Supervision of other normal pregnancy, antepartum - Primary   Obesity affecting pregnancy  Other Visit Diagnoses    [redacted] weeks gestation of pregnancy       History of postpartum depression        PNV, [place], PTL precautions GBS nv  pregnancy3 Problems (from 05/28/21 to present)    Problem Noted Resolved   Supervision of other normal pregnancy, antepartum 05/28/2021 by Will Bonnet, MD No   Overview Addendum 11/16/2021  4:23 PM by Allen Derry, CNM     Nursing Staff Provider  Office Location  [facility] Dating   6 wk [place]  Language  English Anatomy US  complete  Flu Vaccine  08/24/21 Genetic Screen  NIPS: declined  TDaP vaccine   11/11/21 Hgb A1C or  GTT Early : hgba1c 5.1 Third trimester :   Covid Vaccinated   LAB RESULTS   Rhogam  Not needed Blood Type A/Positive/-- (07/15 0921)   Feeding Plan Breast Antibody Negative (07/15 0921)  Contraception condoms Rubella 3.78 (07/15 0921)  Circumcision yes RPR Non Reactive (07/15 0921)   Pediatrician  [facility] HBsAg Negative (07/15 0921)   Support Person Husband- Chayne HIV Non Reactive (07/15 0921)  Prenatal Classes Discussed Varicella Immune    GBS     BTL Consent     VBAC Consent  Pap 2020 NIL    Hgb Electro      CF      SMA         Hx of PPD: Script for zoloft given                     Needs 2 wk PP visit         Asthma during pregnancy 05/28/2021 by Will Bonnet, MD No   Obesity affecting pregnancy 05/28/2021 by  Will Bonnet, MD No       Barnett Applebaum, MD, [facility], [facility] 12/16/2021  10:46 AM

## 2021-12-23 ENCOUNTER — Ambulatory Visit (INDEPENDENT_AMBULATORY_CARE_PROVIDER_SITE_OTHER): Payer: No Typology Code available for payment source | Admitting: Obstetrics and Gynecology

## 2021-12-23 ENCOUNTER — Other Ambulatory Visit: Payer: Self-pay

## 2021-12-23 ENCOUNTER — Other Ambulatory Visit (HOSPITAL_COMMUNITY)
Admission: RE | Admit: 2021-12-23 | Discharge: 2021-12-23 | Disposition: A | Discharge status: Home / Self Care | Payer: No Typology Code available for payment source | Source: Ambulatory Visit | Attending: Obstetrics and Gynecology | Admitting: Obstetrics and Gynecology

## 2021-12-23 ENCOUNTER — Encounter: Payer: Self-pay | Admitting: Obstetrics and Gynecology

## 2021-12-23 VITALS — BP 118/72 | Ht 67.0 in | Wt 232.0 lb

## 2021-12-23 DIAGNOSIS — Z3A36 36 weeks gestation of pregnancy: Secondary | ICD-10-CM | POA: Diagnosis present

## 2021-12-23 DIAGNOSIS — Z3483 Encounter for supervision of other normal pregnancy, third trimester: Secondary | ICD-10-CM | POA: Diagnosis present

## 2021-12-23 LAB — POCT URINALYSIS DIPSTICK OB
Glucose, UA: NEGATIVE
POC,PROTEIN,UA: NEGATIVE

## 2021-12-23 NOTE — Progress Notes (Signed)
Routine Prenatal Care Visit  Subjective  Mary Richards is a 36 y.o. G3P1011 at [redacted]w[redacted]d being seen today for ongoing prenatal care.  She is currently monitored for the following issues for this low-risk pregnancy and has GAD (generalized anxiety disorder); Chronic low back pain; Bilateral impacted cerumen; Encounter for examination of blood pressure with abnormal findings; Supervision of other normal pregnancy, antepartum; Asthma during pregnancy; Obesity affecting pregnancy; and Decreased fetal movement on their problem list.  ----------------------------------------------------------------------------------- Patient reports no complaints.   Contractions: Irregular. Vag. Bleeding: None.  Movement: Present. Denies leaking of fluid.  ----------------------------------------------------------------------------------- The following portions of the patient's history were reviewed and updated as appropriate: allergies, current medications, past family history, past medical history, past social history, past surgical history and problem list. Problem list updated.   Objective  Blood pressure 118/72, height 5\' 7"  (1.702 m), weight 232 lb (105.2 kg), last menstrual period 03/30/2021, unknown if currently breastfeeding. Pregravid weight 200 lb (90.7 kg) Total Weight Gain 32 lb (14.5 kg) Urinalysis:      Fetal Status: Fetal Heart Rate (bpm): 140 Fundal Height: 37 cm Movement: Present  Presentation: Vertex  General:  Alert, oriented and cooperative. Patient is in no acute distress.  Skin: Skin is warm and dry. No rash noted.   Cardiovascular: Normal heart rate noted  Respiratory: Normal respiratory effort, no problems with respiration noted  Abdomen: Soft, gravid, appropriate for gestational age. Pain/Pressure: Present     Pelvic:  Cervical exam performed Dilation: 2 Effacement (%): 50 Station: -3  Extremities: Normal range of motion.  Edema: None  Mental Status: Normal mood and affect.  Normal behavior. Normal judgment and thought content.     Assessment   36 y.o. G3P1011 at [redacted]w[redacted]d by  01/18/2022, by Ultrasound presenting for routine prenatal visit  Plan   pregnancy3 Problems (from 05/28/21 to present)     Problem Noted Resolved   Supervision of other normal pregnancy, antepartum 05/28/2021 by Will Bonnet, MD No   Overview Addendum 11/16/2021  4:23 PM by Allen Derry, [place] Staff Provider  Office Location  [facility] Dating   6 wk [place]  Language  English Anatomy US  complete  Flu Vaccine  08/24/21 Genetic Screen  NIPS: declined  TDaP vaccine   11/11/21 Hgb A1C or  GTT Early : hgba1c 5.1 Third trimester :   Covid Vaccinated   LAB RESULTS   Rhogam  Not needed Blood Type A/Positive/-- (07/15 0921)   Feeding Plan Breast Antibody Negative (07/15 0921)  Contraception condoms Rubella 3.78 (07/15 0921)  Circumcision yes RPR Non Reactive (07/15 0921)   Pediatrician  [facility] HBsAg Negative (07/15 0921)   Support Person Husband- Chayne HIV Non Reactive (07/15 2355)  Prenatal Classes Discussed Varicella Immune    GBS     BTL Consent     VBAC Consent  Pap 2020 NIL    Hgb Electro      CF      SMA        Hx of PPD: Script for zoloft given                     Needs 2 wk PP visit         Asthma during pregnancy 05/28/2021 by Will Bonnet, MD No   Obesity affecting pregnancy 05/28/2021 by Will Bonnet, MD No       GBS/ GC/CT today  Gestational age appropriate obstetric precautions including  but not limited to vaginal bleeding, contractions, leaking of fluid and fetal movement were reviewed in detail with the patient.    Return in about 1 week (around 12/30/2021) for ROB weekly x 3 visits.  Homero Fellers MD [facility], [facility] 12/23/2021, 10:37 AM

## 2021-12-23 NOTE — Patient Instructions (Signed)
Third Trimester of Pregnancy The third trimester of pregnancy is from week 28 through week 32. This is months 7 through 9. The third trimester is a time when the unborn baby (fetus) is growing rapidly. At the end of the ninth month, the fetus is about 20 inches long and weighs 6-10 pounds. Body changes during your third trimester During the third trimester, your body will continue to go through many changes. The changes vary and generally return to normal after your baby is born. Physical changes Your weight will continue to increase. You can expect to gain 25-35 pounds (11-16 kg) by the end of the pregnancy if you begin pregnancy at a normal weight. If you are underweight, you can expect to gain 28-40 lb (about 13-18 kg), and if you are overweight, you can expect to gain 15-25 lb (about 7-11 kg). You may begin to get stretch marks on your hips, abdomen, and breasts. Your breasts will continue to grow and may hurt. A yellow fluid (colostrum) may leak from your breasts. This is the first milk you are producing for your baby. You may have changes in your hair. These can include thickening of your hair, rapid growth, and changes in texture. Some people also have hair loss during or after pregnancy, or hair that feels dry or thin. Your belly button may stick out. You may notice more swelling in your hands, face, or ankles. Health changes You may have heartburn. You may have constipation. You may develop hemorrhoids. You may develop swollen, bulging veins (varicose veins) in your legs. You may have increased body aches in the pelvis, back, or thighs. This is due to weight gain and increased hormones that are relaxing your joints. You may have increased tingling or numbness in your hands, arms, and legs. The skin on your abdomen may also feel numb. You may feel short of breath because of your expanding uterus. Other changes You may urinate more often because the fetus is moving lower into your pelvis  and pressing on your bladder. You may have more problems sleeping. This may be caused by the size of your abdomen, an increased need to urinate, and an increase in your body's metabolism. You may notice the fetus "dropping," or moving lower in your abdomen (lightening). You may have increased vaginal discharge. You may notice that you have pain around your pelvic bone as your uterus distends. Follow these instructions at home: Medicines Follow your health care provider's instructions regarding medicine use. Specific medicines may be either safe or unsafe to take during pregnancy. Do not take any medicines unless approved by your health care provider. Take a prenatal vitamin that contains at least 600 micrograms (mcg) of folic acid. Eating and drinking Eat a healthy diet that includes fresh fruits and vegetables, whole grains, good sources of protein such as meat, eggs, or tofu, and low-fat dairy products. Avoid raw meat and unpasteurized juice, milk, and cheese. These carry germs that can harm you and your baby. Eat 4 or 5 small meals rather than 3 large meals a day. You may need to take these actions to prevent or treat constipation: Drink enough fluid to keep your urine pale yellow. Eat foods that are high in fiber, such as beans, whole grains, and fresh fruits and vegetables. Limit foods that are high in fat and processed sugars, such as fried or sweet foods. Activity Exercise only as directed by your health care provider. Most people can continue their usual exercise routine during pregnancy. Try to  exercise for 30 minutes at least 5 days a week. Stop exercising if you experience contractions in the uterus. Stop exercising if you develop pain or cramping in the lower abdomen or lower back. Avoid heavy lifting. Do not exercise if it is very hot or humid or if you are at a high altitude. If you choose to, you may continue to have sex unless your health care provider tells you not  to. Relieving pain and discomfort Take frequent breaks and rest with your legs raised (elevated) if you have leg cramps or low back pain. Take warm sitz baths to soothe any pain or discomfort caused by hemorrhoids. Use hemorrhoid cream if your health care provider approves. Wear a supportive bra to prevent discomfort from breast tenderness. If you develop varicose veins: Wear support hose as told by your health care provider. Elevate your feet for 15 minutes, 3-4 times a day. Limit salt in your diet. Safety Talk to your health care provider before traveling far distances. Do not use hot tubs, steam rooms, or saunas. Wear your seat belt at all times when driving or riding in a car. Talk with your health care provider if someone is verbally or physically abusive to you. Preparing for birth To prepare for the arrival of your baby: Take prenatal classes to understand, practice, and ask questions about labor and delivery. Visit the hospital and tour the maternity area. Purchase a rear-facing car seat and make sure you know how to install it in your car. Prepare the baby's room or sleeping area. Make sure to remove all pillows and stuffed animals from the baby's crib to prevent suffocation. General instructions Avoid cat litter boxes and soil used by cats. These carry germs that can cause birth defects in the baby. If you have a cat, ask someone to clean the litter box for you. Do not douche or use tampons. Do not use scented sanitary pads. Do not use any products that contain nicotine or tobacco, such as cigarettes, e-cigarettes, and chewing tobacco. If you need help quitting, ask your health care provider. Do not use any herbal remedies, illegal drugs, or medicines that were not prescribed to you. Chemicals in these products can harm your baby. Do not drink alcohol. You will have more frequent prenatal exams during the third trimester. During a routine prenatal visit, your health care provider  will do a physical exam, perform tests, and discuss your overall health. Keep all follow-up visits. This is important. Where to find more information American Pregnancy Association: americanpregnancy.[place] and Gynecologists: PoolDevices.com.pt Office on [employer]: KeywordPortfolios.com.br Contact a health care provider if you have: A fever. Mild pelvic cramps, pelvic pressure, or nagging pain in your abdominal area or lower back. Vomiting or diarrhea. Bad-smelling vaginal discharge or foul-smelling urine. Pain when you urinate. A headache that does not go away when you take medicine. Visual changes or see spots in front of your eyes. Get help right away if: Your water breaks. You have regular contractions less than 5 minutes apart. You have spotting or bleeding from your vagina. You have severe abdominal pain. You have difficulty breathing. You have chest pain. You have fainting spells. You have not felt your baby move for the time period told by your health care provider. You have new or increased pain, swelling, or redness in an arm or leg. Summary The third trimester of pregnancy is from week 28 through week 40 (months 7 through 9). You may have more problems sleeping.  This can be caused by the size of your abdomen, an increased need to urinate, and an increase in your body's metabolism. You will have more frequent prenatal exams during the third trimester. Keep all follow-up visits. This is important. This information is not intended to replace advice given to you by your health care provider. Make sure you discuss any questions you have with your health care provider. Document Revised: 04/08/2020 Document Reviewed: 02/13/2020 Elsevier Patient Education  [place]. Vaginal Delivery Vaginal delivery means that you give birth by pushing your baby out of your birth canal (vagina). Your health care team will help you  before, during, and after vaginal delivery. Birth experiences are unique for every woman and every pregnancy, and birth experiences vary depending on where you choose to give birth. What are the risks and benefits? Generally, this is safe. However, problems may occur, including: Bleeding. Infection. Damage to other structures such as vaginal tearing. Allergic reactions to medicines. Despite the risks, benefits of vaginal delivery include less risk of bleeding and infection and a shorter recovery time compared to a Cesarean delivery. Cesarean delivery, or C-section, is the surgical delivery of a baby. What happens when I arrive at the birth center or hospital? Once you are in labor and have been admitted into the hospital or birth center, your health care team may: Review your pregnancy history and any concerns that you have. Talk with you about your birth plan and discuss pain control options. Check your blood pressure, breathing, and heartbeat. Assess your baby's heartbeat. Monitor your uterus for contractions. Check whether your bag of water (amniotic sac) has broken (ruptured). Insert an IV into one of your veins. This may be used to give you fluids and medicines. Monitoring Your health care team may assess your contractions (uterine monitoring) and your baby's heart rate (fetal monitoring). You may need to be monitored: Often, but not continuously (intermittently). All the time or for long periods at a time (continuously). Continuous monitoring may be needed if: You are taking certain medicines, such as medicine to relieve pain or make your contractions stronger. You have pregnancy or labor complications. Monitoring may be done by: Placing a special stethoscope or a handheld monitoring device on your abdomen to check your baby's heartbeat and to check for contractions. Placing monitors on your abdomen (external monitors) to record your baby's heartbeat and the frequency and length of  contractions. Placing monitors inside your uterus through your vagina (internal monitors) to record your baby's heartbeat and the frequency, length, and strength of your contractions. Depending on the type of monitor, it may remain in your uterus or on your baby's head until birth. Telemetry. This is a type of continuous monitoring that can be done with external or internal monitors. Instead of having to stay in bed, you are able to move around. Physical exam Your health care team may perform frequent physical exams. This may include: Checking how and where your baby is positioned in your uterus. Checking your cervix to determine: Whether it is thinning out (effacing). Whether it is opening up (dilating). What happens during labor and delivery? Normal labor and delivery is divided into the following three stages: Stage 1 This is the longest stage of labor. Throughout this stage, you will feel contractions. Contractions generally feel mild, infrequent, and irregular at first. They get stronger, more frequent, and more regular as you move through this stage. You may have contractions about every 2-3 minutes. This stage ends when your cervix is  completely dilated to 4 inches (10 cm) and completely effaced. Stage 2 This stage starts once your cervix is completely effaced and dilated and lasts until the delivery of your baby. This is the stage where you will feel an urge to push your baby out of your vagina. You may feel stretching and burning pain, especially when the widest part of your baby's head passes through the vaginal opening (crowning). Once your baby is delivered, the umbilical cord will be clamped and cut. Timing of cutting the cord will depend on your wishes, your baby's health, and your health care provider's practices. Your baby will be placed on your bare chest (skin-to-skin contact) in an upright position and covered with a warm blanket. If you are choosing to breastfeed, watch your  baby for feeding cues, like rooting or sucking, and help the baby to your breast for his or her first feeding. Stage 3 This stage starts immediately after the birth of your baby and ends after you deliver the placenta. This stage may take anywhere from 5 to 30 minutes. After your baby has been delivered, you will feel contractions as your body expels the placenta. These contractions also help your uterus get smaller and reduce bleeding. What can I expect after labor and delivery? After labor is over, you and your baby will be assessed closely until you are ready to go home. Your health care team will teach you how to care for yourself and your baby. You and your baby may be encouraged to stay in the same room (rooming in) during your hospital stay. This will help promote early bonding and successful breastfeeding. Your uterus will be checked and massaged regularly (fundal massage). You may continue to receive fluids and medicines through an IV. You will have some soreness and pain in your abdomen, vagina, and the area of skin between your vaginal opening and your anus (perineum). If an incision was made near your vagina (episiotomy) or if you had some vaginal tearing during delivery, cold compresses may be placed on your episiotomy or your tear. This helps to reduce pain and swelling. It is normal to have vaginal bleeding after delivery. Wear a sanitary pad for vaginal bleeding and discharge. Summary Vaginal delivery means that you will give birth by pushing your baby out of your birth canal (vagina). Your health care team will monitor you and your baby throughout the stages of labor. After you deliver your baby, your health care team will continue to assess you and your baby to ensure you are both recovering as expected after delivery. This information is not intended to replace advice given to you by your health care provider. Make sure you discuss any questions you have with your health care  provider. Document Revised: 09/28/2020 Document Reviewed: 09/28/2020 Elsevier Patient Education  2022 [place]. Pain Relief During Labor and Delivery Many things can cause pain during labor and delivery, including: Pressure due to the baby moving through the pelvis. Stretching of tissues due to the baby moving through the birth canal. Muscle tension due to anxiety or nervousness. The uterus tightening (contracting)and relaxing to help move the baby. How do I get pain relief during labor and delivery? Discuss your pain relief options with your health care provider during your prenatal visits. Explore the options offered by your hospital or birth center. There are many ways to deal with the pain of labor and delivery. You can try relaxation techniques or doing relaxing activities, taking a warm shower or bath (hydrotherapy),  or other methods. There are also many medicines available to help control pain. Relaxation techniques and activities Practice relaxation techniques or do relaxing activities, such as: Focused breathing. Meditation. Visualization. Aroma therapy. Listening to your favorite music. Hypnosis. Hydrotherapy Take a warm shower or bath. This may: Provide comfort and relaxation. Lessen your feeling of pain. Reduce the amount of pain medicine needed. Shorten the length of labor. Other methods Try doing other things, such as: Getting a massage or having counterpressure on your back. Applying warm packs or ice packs. Changing positions often, moving around, or using a birthing ball. Medicines You may be given: Pain medicine through an IV or an injection into a muscle. Pain medicine inserted into your spinal column. Injections of sterile water just under the skin on your lower back. Nitrous oxide inhalation therapy, also called laughing gas. What kinds of medicine are available for pain relief? There are two kinds of medicines that can be used to relieve pain during  labor and delivery: Analgesics. These medicines decrease pain without causing you to lose feeling or the ability to move your muscles. Anesthetics. These medicines block feeling in the body and can decrease your ability to move freely. Both kinds of medicine can cause minor side effects, such as nausea, trouble concentrating, and sleepiness. They can also affect the baby's heart rate before birth and his or her breathing after birth. For this reason, health care providers are careful about when and how much medicine is given. Which medicines are used to provide pain relief? Common medicines The most common medicines used to help manage pain during labor and delivery include: Opioids. Opioids are medicines that decrease how much pain you feel (perception of pain). These medicines can be given through an IV or may be used with anesthetics to block pain. Epidural analgesia. Epidural analgesia is given through a very thin tube that is inserted into the lower back. Medicine is delivered continuously to the area near your spinal column nerves (epidural space). After having this treatment, you may be able to move your legs, but you will not be able to walk. Depending on the amount and type of medicine given, you may lose all feeling in the lower half of your body, or you may have some sensation, including the urge to push. This treatment can be used to give pain relief for a vaginal birth. Sometimes, a numbing medicine is injected into the spinal fluid when an epidural catheter is placed. This provides for immediate relief but only lasts for 1-2 hours. Once it wears off, the epidural will provide pain relief. This is called a combined spinal-epidural (CSE) block. Intrathecal analgesia (spinal analgesia). Intrathecal analgesia is similar to epidural analgesia, but the medicine is injected into the spinal fluid instead of the epidural space. It is usually only given once. It starts to relieve pain quickly, but the  pain relief lasts only 1-2 hours. Pudendal block. This block is done by injecting numbing medicine through the wall of the vagina and into a nerve in the pelvis. Other medicines Other medicines used to help manage pain during labor and delivery include: Local anesthetics. These are used to numb a small area of the body. They may be used along with another kind of medicine or used to numb the nerves of the vagina, cervix, and perineum during the second stage of labor. Spinal block (spinal anesthesia). Spinal anesthesia is similar to spinal analgesia, but the medicine that is used contains longer-acting numbing medicines and pain medicines. This  type of anesthesia can be used for a cesarean delivery and allows you to stay awake for the birth of your baby. General anesthetics cause you to lose consciousness so you do not feel pain. They are usually only used for an emergency cesarean delivery. These medicines are given through an IV or a mask or both. These medicines are used as part of a procedure or for an emergency delivery. Summary Women have many options to help them manage the pain associated with labor and delivery. You can try doing relaxing activities, taking a warm shower or bath, or other methods. There are also many medicines available to help control pain during labor and delivery. Talk with your health care provider about what options are available to you. This information is not intended to replace advice given to you by your health care provider. Make sure you discuss any questions you have with your health care provider. Document Revised: 09/18/2019 Document Reviewed: 09/18/2019 Elsevier Patient Education  [place].

## 2021-12-24 ENCOUNTER — Encounter: Payer: Self-pay | Admitting: Obstetrics and Gynecology

## 2021-12-24 LAB — CERVICOVAGINAL ANCILLARY ONLY
Chlamydia: NEGATIVE
Comment: NEGATIVE
Comment: NEGATIVE
Comment: NORMAL
Neisseria Gonorrhea: NEGATIVE
Trichomonas: NEGATIVE

## 2021-12-27 LAB — CULTURE, BETA STREP (GROUP B ONLY): Strep Gp B Culture: NEGATIVE

## 2021-12-30 ENCOUNTER — Other Ambulatory Visit: Payer: Self-pay

## 2021-12-30 ENCOUNTER — Ambulatory Visit (INDEPENDENT_AMBULATORY_CARE_PROVIDER_SITE_OTHER): Payer: No Typology Code available for payment source | Admitting: Obstetrics

## 2021-12-30 VITALS — BP 122/70 | Wt 229.0 lb

## 2021-12-30 DIAGNOSIS — Z3483 Encounter for supervision of other normal pregnancy, third trimester: Secondary | ICD-10-CM

## 2021-12-30 DIAGNOSIS — Z3A37 37 weeks gestation of pregnancy: Secondary | ICD-10-CM

## 2021-12-30 NOTE — Progress Notes (Signed)
Routine Prenatal Care Visit  Subjective  Mary Richards is a 36 y.o. G3P1011 at [redacted]w[redacted]d being seen today for ongoing prenatal care.  She is currently monitored for the following issues for this low-risk pregnancy and has GAD (generalized anxiety disorder); Chronic low back pain; Bilateral impacted cerumen; Encounter for examination of blood pressure with abnormal findings; Supervision of other normal pregnancy, antepartum; Asthma during pregnancy; Obesity affecting pregnancy; and Decreased fetal movement on their problem list.  ----------------------------------------------------------------------------------- Patient reports no complaints.  She passed  A mucous plug. Contractions: Irregular. Vag. Bleeding: None.  Movement: Present. Leaking Fluid denies.  ----------------------------------------------------------------------------------- The following portions of the patient's history were reviewed and updated as appropriate: allergies, current medications, past family history, past medical history, past social history, past surgical history and problem list. Problem list updated.  Objective  Blood pressure 122/70, weight 229 lb (103.9 kg), last menstrual period 03/30/2021, unknown if currently breastfeeding. Pregravid weight 200 lb (90.7 kg) Total Weight Gain 29 lb (13.2 kg) Urinalysis: Urine Protein    Urine Glucose    Fetal Status:     Movement: Present     General:  Alert, oriented and cooperative. Patient is in no acute distress.  Skin: Skin is warm and dry. No rash noted.   Cardiovascular: Normal heart rate noted  Respiratory: Normal respiratory effort, no problems with respiration noted  Abdomen: Soft, gravid, appropriate for gestational age. Pain/Pressure: Present     Pelvic:  Cervical exam performed      3 cms/70%/-3. soft  Extremities: Normal range of motion.     Mental Status: Normal mood and affect. Normal behavior. Normal judgment and thought content.   Assessment   36  y.o. G3P1011 at [redacted]w[redacted]d by  01/18/2022, by Ultrasound presenting for routine prenatal visit  Plan   pregnancy3 Problems (from 05/28/21 to present)    Problem Noted Resolved   Supervision of other normal pregnancy, antepartum 05/28/2021 by Will Bonnet, MD No   Overview Addendum 11/16/2021  4:23 PM by Allen Derry, [place] Staff Provider  Office Location  [facility] Dating   6 wk [place]  Language  English Anatomy US  complete  Flu Vaccine  08/24/21 Genetic Screen  NIPS: declined  TDaP vaccine   11/11/21 Hgb A1C or  GTT Early : hgba1c 5.1 Third trimester :   Covid Vaccinated   LAB RESULTS   Rhogam  Not needed Blood Type A/Positive/-- (07/15 0921)   Feeding Plan Breast Antibody Negative (07/15 0921)  Contraception condoms Rubella 3.78 (07/15 0921)  Circumcision yes RPR Non Reactive (07/15 0921)   Pediatrician  [facility] HBsAg Negative (07/15 0921)   Support Person Husband- Chayne HIV Non Reactive (07/15 4562)  Prenatal Classes Discussed Varicella Immune    GBS     BTL Consent     VBAC Consent  Pap 2020 NIL    Hgb Electro      CF      SMA         Hx of PPD: Script for zoloft given                     Needs 2 wk PP visit         Asthma during pregnancy 05/28/2021 by Will Bonnet, MD No   Obesity affecting pregnancy 05/28/2021 by Will Bonnet, MD No       Term labor symptoms and general obstetric precautions including but not limited to vaginal bleeding, contractions,  leaking of fluid and fetal movement were reviewed in detail with the patient. Please refer to After Visit Summary for other counseling recommendations.   No follow-ups on file.  Imagene Riches, CNM  12/30/2021 10:22 AM

## 2022-01-03 ENCOUNTER — Other Ambulatory Visit: Payer: Self-pay

## 2022-01-03 ENCOUNTER — Ambulatory Visit (INDEPENDENT_AMBULATORY_CARE_PROVIDER_SITE_OTHER): Payer: No Typology Code available for payment source

## 2022-01-03 ENCOUNTER — Encounter: Payer: Self-pay | Admitting: Obstetrics

## 2022-01-03 DIAGNOSIS — M545 Low back pain, unspecified: Secondary | ICD-10-CM | POA: Diagnosis not present

## 2022-01-03 LAB — POCT URINALYSIS DIPSTICK
Bilirubin, UA: NEGATIVE
Blood, UA: NEGATIVE
Glucose, UA: NEGATIVE
Ketones, UA: NEGATIVE
Leukocytes, UA: NEGATIVE
Nitrite, UA: NEGATIVE
Protein, UA: NEGATIVE
Spec Grav, UA: 1.01 (ref 1.010–1.025)
Urobilinogen, UA: 0.2 E.U./dL
pH, UA: 5 (ref 5.0–8.0)

## 2022-01-06 ENCOUNTER — Ambulatory Visit (INDEPENDENT_AMBULATORY_CARE_PROVIDER_SITE_OTHER): Payer: No Typology Code available for payment source | Admitting: Advanced Practice Midwife

## 2022-01-06 ENCOUNTER — Other Ambulatory Visit: Payer: Self-pay

## 2022-01-06 ENCOUNTER — Encounter: Payer: Self-pay | Admitting: Advanced Practice Midwife

## 2022-01-06 VITALS — BP 110/70 | Wt 231.0 lb

## 2022-01-06 DIAGNOSIS — Z3480 Encounter for supervision of other normal pregnancy, unspecified trimester: Secondary | ICD-10-CM

## 2022-01-06 DIAGNOSIS — Z3A38 38 weeks gestation of pregnancy: Secondary | ICD-10-CM

## 2022-01-06 LAB — POCT URINALYSIS DIPSTICK OB
Glucose, UA: NEGATIVE
POC,PROTEIN,UA: NEGATIVE

## 2022-01-06 NOTE — Progress Notes (Signed)
Routine Prenatal Care Visit  Subjective  Mary Richards is a 36 y.o. G3P1011 at [redacted]w[redacted]d being seen today for ongoing prenatal care.  She is currently monitored for the following issues for this low-risk pregnancy and has GAD (generalized anxiety disorder); Chronic low back pain; Bilateral impacted cerumen; Encounter for examination of blood pressure with abnormal findings; Supervision of other normal pregnancy, antepartum; Asthma during pregnancy; Obesity affecting pregnancy; and Decreased fetal movement on their problem list.  ----------------------------------------------------------------------------------- Patient reports general discomforts of term pregnancy. She has used her inhaler a couple times in the past week due to shortness of breath. She desires 39 week elective induction of labor. Contractions: Irregular. Vag. Bleeding: None.  Movement: Present. Leaking Fluid denies.  ----------------------------------------------------------------------------------- The following portions of the patient's history were reviewed and updated as appropriate: allergies, current medications, past family history, past medical history, past social history, past surgical history and problem list. Problem list updated.  Objective  Blood pressure 110/70, weight 231 lb (104.8 kg), last menstrual period 03/30/2021 Pregravid weight 200 lb (90.7 kg) Total Weight Gain 31 lb (14.1 kg) Urinalysis: Urine Protein    Urine Glucose    Fetal Status: Fetal Heart Rate (bpm): 131 Fundal Height: 39 cm Movement: Present  Presentation: Vertex  General:  Alert, oriented and cooperative. Patient is in no acute distress.  Skin: Skin is warm and dry. No rash noted.   Cardiovascular: Normal heart rate noted  Respiratory: Normal respiratory effort, no problems with respiration noted  Abdomen: Soft, gravid, appropriate for gestational age. Pain/Pressure: Present     Pelvic:  Cervical exam performed Dilation: 3.5 Effacement  (%): 70 Station: -3  Extremities: Normal range of motion.  Edema: None  Mental Status: Normal mood and affect. Normal behavior. Normal judgment and thought content.   Assessment   36 y.o. G3P1011 at [redacted]w[redacted]d by  01/18/2022, by Ultrasound presenting for routine prenatal visit  Plan   pregnancy3 Problems (from 05/28/21 to present)    Problem Noted Resolved   Supervision of other normal pregnancy, antepartum 05/28/2021 by Will Bonnet, MD No   Overview Addendum 11/16/2021  4:23 PM by Allen Derry, [place] Staff Provider  Office Location  [facility] Dating   6 wk [place]  Language  English Anatomy US  complete  Flu Vaccine  08/24/21 Genetic Screen  NIPS: declined  TDaP vaccine   11/11/21 Hgb A1C or  GTT Early : hgba1c 5.1 Third trimester :   Covid Vaccinated   LAB RESULTS   Rhogam  Not needed Blood Type A/Positive/-- (07/15 0921)   Feeding Plan Breast Antibody Negative (07/15 0921)  Contraception condoms Rubella 3.78 (07/15 0921)  Circumcision yes RPR Non Reactive (07/15 0921)   Pediatrician  [facility] HBsAg Negative (07/15 0921)   Support Person Husband- Chayne HIV Non Reactive (07/15 3267)  Prenatal Classes Discussed Varicella Immune    GBS     BTL Consent     VBAC Consent  Pap 2020 NIL    Hgb Electro      CF      SMA         Hx of PPD: Script for zoloft given                     Needs 2 wk PP visit         Asthma during pregnancy 05/28/2021 by Will Bonnet, MD No   Obesity affecting pregnancy 05/28/2021 by Will Bonnet, MD No  Term labor symptoms and general obstetric precautions including but not limited to vaginal bleeding, contractions, leaking of fluid and fetal movement were reviewed in detail with the patient. Please refer to After Visit Summary for other counseling recommendations.   IOL 01/15/22 at 5 AM Covid swab ordered- to be done 01/13/22/form given to patient Hospital admission orders placed Initial H&P done IOL scheduling  form faxed to L&D   Return in about 1 week (around 01/13/2022) for rob.  Rod Can, CNM 01/06/2022 10:35 AM

## 2022-01-06 NOTE — Patient Instructions (Signed)
Signs and Symptoms of Labor Labor is the body's natural process of moving the baby and the placenta out of the uterus. The process of labor usually starts when the baby is full-term, between 59 and 41 weeks of pregnancy. Signs and symptoms that you are close to going into labor As your body prepares for labor and the birth of your baby, you may notice the following symptoms in the weeks and days before true labor starts: Passing a small amount of thick, bloody mucus from your vagina. This is called normal bloody show or losing your mucus plug. This may happen more than a week before labor begins, or right before labor begins, as the opening of the cervix starts to widen (dilate). For some women, the entire mucus plug passes at once. For others, pieces of the mucus plug may gradually pass over several days. Your baby moving (dropping) lower in your pelvis to get into position for birth (lightening). When this happens, you may feel more pressure on your bladder and pelvic bone and less pressure on your ribs. This may make it easier to breathe. It may also cause you to need to urinate more often and have problems with bowel movements. Having "practice contractions," also called Braxton Hicks contractions or false labor. These occur at irregular (unevenly spaced) intervals that are more than 10 minutes apart. False labor contractions are common after exercise or sexual activity. They will stop if you change position, rest, or drink fluids. These contractions are usually mild and do not get stronger over time. They may feel like: A backache or back pain. Mild cramps, similar to menstrual cramps. Tightening or pressure in your abdomen. Other early symptoms include: Nausea or loss of appetite. Diarrhea. Having a sudden burst of energy, or feeling very tired. Mood changes. Having trouble sleeping. Signs and symptoms that labor has begun Signs that you are in labor may include: Having contractions that come  at regular (evenly spaced) intervals and increase in intensity. This may feel like more intense tightening or pressure in your abdomen that moves to your back. Contractions may also feel like rhythmic pain in your upper thighs or back that comes and goes at regular intervals. If you are delivering for the first time, this change in intensity of contractions often occurs at a more gradual pace. If you have given birth before, you may notice a more rapid progression of contraction changes. Feeling pressure in the vaginal area. Your water breaking (rupture of membranes). This is when the sac of fluid that surrounds your baby breaks. Fluid leaking from your vagina may be clear or blood-tinged. Labor usually starts within 24 hours of your water breaking, but it may take longer to begin. Some people may feel a sudden gush of fluid; others may notice repeatedly damp underwear. Follow these instructions at home:  When labor starts, or if your water breaks, call your health care provider or nurse care line. Based on your situation, they will determine when you should go in for an exam. During early labor, you may be able to rest and manage symptoms at home. Some strategies to try at home include: Breathing and relaxation techniques. Taking a warm bath or shower. Listening to music. Using a heating pad on the lower back for pain. If directed, apply heat to the area as often as told by your health care provider. Use the heat source that your health care provider recommends, such as a moist heat pack or a heating pad. Place a  towel between your skin and the heat source. Leave the heat on for 20-30 minutes. Remove the heat if your skin turns bright red. This is especially important if you are unable to feel pain, heat, or cold. You have a greater risk of getting burned. Contact a health care provider if: Your labor has started. Your water breaks. You have nausea, vomiting, or diarrhea. Get help right away  if: You have painful, regular contractions that are 5 minutes apart or less. Labor starts before you are [redacted] weeks along in your pregnancy. You have a fever. You have bright red blood coming from your vagina. You do not feel your baby moving. You have a severe headache with or without vision problems. You have chest pain or shortness of breath. These symptoms may represent a serious problem that is an emergency. Do not wait to see if the symptoms will go away. Get medical help right away. Call your local emergency services (911 in the [place] Do not drive yourself to the hospital. Summary Labor is your body's natural process of moving your baby and the placenta out of your uterus. The process of labor usually starts when your baby is full-term, between 40 and 40 weeks of pregnancy. When labor starts, or if your water breaks, call your health care provider or nurse care line. Based on your situation, they will determine when you should go in for an exam. This information is not intended to replace advice given to you by your health care provider. Make sure you discuss any questions you have with your health care provider. Document Revised: 03/16/2021 Document Reviewed: 03/16/2021 Elsevier Patient Education  2022 [place] Induction Labor induction is when steps are taken to cause a pregnant woman to begin the labor process. Most women go into labor on their own between 37 weeks and 42 weeks of pregnancy. When this does not happen, or when there is a medical need for labor to begin, steps may be taken to induce, or bring on, labor. Labor induction causes a pregnant woman's uterus to contract. It also causes the cervix to soften (ripen), open (dilate), and thin out. Usually, labor is not induced before 39 weeks of pregnancy unless there is a medical reason to do so. When is labor induction considered? Labor induction may be right for you if: Your pregnancy lasts longer than 41 to 42  weeks. Your placenta is separating from your uterus (placental abruption). You have a rupture of membranes and your labor does not begin. You have health problems, like diabetes or high blood pressure (preeclampsia) during your pregnancy. Your baby has stopped growing or does not have enough amniotic fluid. Before labor induction begins, your health care provider will consider the following factors: Your medical condition and the baby's condition. How many weeks you have been pregnant. How mature the baby's lungs are. The condition of your cervix. The position of the baby. The size of your birth canal. Tell a health care provider about: Any allergies you have. All medicines you are taking, including vitamins, herbs, eye drops, creams, and over-the-counter medicines. Any problems you or your family members have had with anesthetic medicines. Any surgeries you have had. Any blood disorders you have. Any medical conditions you have. What are the risks? Generally, this is a safe procedure. However, problems may occur, including: Failed induction. Changes in fetal heart rate, such as being too high, too low, or irregular (erratic). Infection in the mother or the baby. Increased risk of having  a cesarean delivery. Breaking off (abruption) of the placenta from the uterus. This is rare. Rupture of the uterus. This is very rare. Your baby could fail to get enough blood flow or oxygen. This can be life-threatening. When induction is needed for medical reasons, the benefits generally outweigh the risks. What happens during the procedure? During the procedure, your health care provider will use one of these methods to induce labor: Stripping the membranes. In this method, the amniotic sac tissue is gently separated from the cervix. This causes the following to happen: Your cervix stretches, which in turn causes the release of prostaglandins. Prostaglandins induce labor and cause the uterus to  contract. This procedure is often done in an office visit. You will be sent home to wait for contractions to begin. Prostaglandin medicine. This medicine starts contractions and causes the cervix to dilate and ripen. This can be taken by mouth (orally) or by being inserted into the vagina (suppository). Inserting a small, thin tube (catheter) with a balloon into the vagina and then expanding the balloon with water to dilate the cervix. Breaking the water. In this method, a small instrument is used to make a small hole in the amniotic sac. This eventually causes the amniotic sac to break. Contractions should begin within a few hours. Medicine to trigger or strengthen contractions. This medicine is given through an IV that is inserted into a vein in your arm. This procedure may vary among health care providers and hospitals. Where to find more information [employer]: www.marchofdimes.org [employer]ians [employer]necologists: www.acog.org Summary Labor induction causes a pregnant woman's uterus to contract. It also causes the cervix to soften (ripen), open (dilate), and thin out. Labor is usually not induced before 39 weeks of pregnancy unless there is a medical reason to do so. When induction is needed for medical reasons, the benefits generally outweigh the risks. Talk with your health care provider about which methods of labor induction are right for you. This information is not intended to replace advice given to you by your health care provider. Make sure you discuss any questions you have with your health care provider. Document Revised: 08/13/2020 Document Reviewed: 08/13/2020 Elsevier Patient Education  [place].

## 2022-01-06 NOTE — H&P (Signed)
OB History & Physical   History of Present Illness:  Date of initial H&P: 01/06/2022  Chief Complaint: elective induction of labor  HPI:  Mary Richards is a 36 y.o. G54P1011 female at [redacted]w[redacted]d dated by 6 week ultrasound.  Her pregnancy has been complicated by obesity, asthma, anxiety .    She reports irregular contractions.   She denies leakage of fluid.   She denies vaginal bleeding.   She reports fetal movement.    Total weight gain for pregnancy: 31 lb (14.1 kg)   Obstetrical Problem List: pregnancy3 Problems (from 05/28/21 to present)     Problem Noted Resolved   Supervision of other normal pregnancy, antepartum 05/28/2021 by Will Bonnet, MD No   Overview Addendum 11/16/2021  4:23 PM by Allen Derry, [place] Staff Provider  Office Location  [facility] Dating   6 wk [place]  Language  English Anatomy US  complete  Flu Vaccine  08/24/21 Genetic Screen  NIPS: declined  TDaP vaccine   11/11/21 Hgb A1C or  GTT Early : hgba1c 5.1 Third trimester :   Covid Vaccinated   LAB RESULTS   Rhogam  Not needed Blood Type A/Positive/-- (07/15 0921)   Feeding Plan Breast Antibody Negative (07/15 0921)  Contraception condoms Rubella 3.78 (07/15 0921)  Circumcision yes RPR Non Reactive (07/15 0921)   Pediatrician  [facility] HBsAg Negative (07/15 0921)   Support Person Husband- Chayne HIV Non Reactive (07/15 9326)  Prenatal Classes Discussed Varicella Immune    GBS     BTL Consent     VBAC Consent  Pap 2020 NIL    Hgb Electro      CF      SMA        Hx of PPD: Script for zoloft given                     Needs 2 wk PP visit         Asthma during pregnancy 05/28/2021 by Will Bonnet, MD No   Obesity affecting pregnancy 05/28/2021 by Will Bonnet, MD No        Maternal Medical History:   Past Medical History:  Diagnosis Date   Asthma    Chronic migraine    Complication of anesthesia    DDD (degenerative disc disease), lumbosacral    Family  history of adverse reaction to anesthesia    Mother and Father - PONV   PONV (postoperative nausea and vomiting)    Ruptured left tubal ectopic pregnancy causing hemoperitoneum 01/23/2021   Wears contact lenses     Past Surgical History:  Procedure Laterality Date   CHOLECYSTECTOMY     COLONOSCOPY WITH PROPOFOL N/A 02/19/2021   Procedure: COLONOSCOPY WITH PROPOFOL;  Surgeon: Jonathon Bellows, MD;  Location: [facility] ENDOSCOPY;  Service: Gastroenterology;  Laterality: N/A;   DIAGNOSTIC LAPAROSCOPY WITH REMOVAL OF ECTOPIC PREGNANCY Left 01/23/2021   Procedure: DIAGNOSTIC LAPAROSCOPY WITH REMOVAL OF ECTOPIC PREGNANCY;  Surgeon: Will Bonnet, MD;  Location: [facility] ORS;  Service: Gynecology;  Laterality: Left;   PILONIDAL CYST EXCISION     TONSILECTOMY, ADENOIDECTOMY, BILATERAL MYRINGOTOMY AND TUBES     TYMPANOPLASTY Right 05/10/2019   Procedure: TYMPANOPLASTY;  Surgeon: Beverly Gust, MD;  Location: [facility];  Service: ENT;  Laterality: Right;    Allergies  Allergen Reactions   Azithromycin Hives, Shortness Of Breath and Swelling    Throat swelling   Sulfa Antibiotics Hives  Ciprofloxacin Swelling   Moxifloxacin Hcl Hives   Bactrim [Sulfamethoxazole-Trimethoprim] Rash   Wound Dressing Adhesive Rash    Dermabond    Prior to Admission medications   Medication Sig Start Date End Date Taking? Authorizing Provider  albuterol (VENTOLIN HFA) 108 (90 Base) MCG/ACT inhaler INHALE 2 PUFFS INTO THE LUNGS EVERY 6 HOURS AS NEEDED FOR WHEEZING OR SHORTNESS OF BREATH. 01/12/21 01/12/22 Yes Arnett, Yvetta Coder, FNP  azelastine (ASTELIN) 0.1 % nasal spray Place 1 spray into both nostrils 2 (two) times daily. Use in each nostril as directed 12/14/21  Yes Arnett, Yvetta Coder, FNP  cetirizine (ZYRTEC) 10 MG tablet Take 10 mg by mouth daily.   Yes [provider]  ferrous sulfate 325 (65 FE) MG tablet Take 325 mg by mouth daily with breakfast.   Yes [provider]  Prenatal Vit-Fe  Fumarate-FA (PRENATAL VITAMIN) 27-0.8 MG TABS Take 1 tablet by mouth daily.   Yes [provider]  Saccharomyces boulardii (PROBIOTIC) 250 MG CAPS TAKE 1 CAPSULE BY MOUTH 2 (TWO) TIMES DAILY. 01/11/21 01/11/22 Yes Arnett, Yvetta Coder, FNP  sertraline (ZOLOFT) 25 MG tablet Take 1 tablet (25 mg total) by mouth daily. Take 25mg  daily for 1 to 2 weeks.  May increases to 50 mg (2 tablets) daily if needed 11/11/21  Yes Dominic, Nunzio Cobbs, CNM    OB History  Gravida Para Term Preterm AB Living  3 1 1   1 1   SAB IAB Ectopic Multiple Live Births      1   1    # Outcome Date GA Lbr Len/2nd Weight Sex Delivery Anes PTL Lv  3 Current           2 Ectopic 01/23/21          1 Term 08/28/13 [redacted]w[redacted]d  7 lb 3 oz (3.26 kg) M Vag-Spont   LIV     Complications: Preterm contractions    Prenatal care site: [facility]  Social History: She  reports that she has never smoked. She has never used smokeless tobacco. She reports current alcohol use of about 2.0 standard drinks per week. She reports that she does not use drugs.  Family History: family history includes Brain cancer (age of onset: 104) in her father; Brain cancer (age of onset: 69) in her paternal grandmother; COPD in her maternal grandfather; Irritable bowel syndrome in her mother; Valvular heart disease in her sister.    Review of Systems:  Review of Systems  Constitutional:  Negative for chills and fever.  HENT:  Negative for congestion, ear discharge, ear pain, hearing loss, sinus pain and sore throat.   Eyes:  Negative for blurred vision and double vision.  Respiratory:  Positive for shortness of breath. Negative for cough and wheezing.   Cardiovascular:  Negative for chest pain, palpitations and leg swelling.  Gastrointestinal:  Negative for abdominal pain, blood in stool, constipation, diarrhea, heartburn, melena, nausea and vomiting.  Genitourinary:  Negative for dysuria, flank pain, frequency, hematuria and urgency.       Positive  for pelvic pain  Musculoskeletal:  Negative for back pain, joint pain and myalgias.  Skin:  Negative for itching and rash.  Neurological:  Negative for dizziness, tingling, tremors, sensory change, speech change, focal weakness, seizures, loss of consciousness, weakness and headaches.  Endo/Heme/Allergies:  Negative for environmental allergies. Does not bruise/bleed easily.  Psychiatric/Behavioral:  Negative for depression, hallucinations, memory loss, substance abuse and suicidal ideas. The patient is not nervous/anxious and does not have  insomnia.     Physical Exam:  BP 110/70    Wt 231 lb (104.8 kg)    LMP 03/30/2021    BMI 36.18 kg/m   Constitutional: Well nourished, well developed female in no acute distress.  HEENT: normal Skin: Warm and dry.  Cardiovascular: Regular rate and rhythm.   Extremity:  no edema   Respiratory: Clear to auscultation bilateral. Normal respiratory effort Abdomen: FHT present Back: no CVAT Neuro: DTRs 2+, Cranial nerves grossly intact Psych: Alert and Oriented x3. No memory deficits. Normal mood and affect.  MS: normal gait, normal bilateral lower extremity ROM/strength/stability.  Pelvic exam:  is not limited by body habitus EGBUS: within normal limits Vagina: within normal limits and with normal mucosa  Cervix: 3.5/70/-3, posterior, cervix swept   Lab Results  Component Value Date   [place] NEGATIVE 02/17/2021  Current Covid test pending  Assessment:  Mary Richards is a 36 y.o. G72P1011 female at [redacted]w[redacted]d with elective induction of labor.   Plan:  Admit to Labor & Delivery  CBC, T&S, Clrs, IVF GBS negative.   Fetal well-being: reassuring Begin induction with pitocin, AROM when appropriate    Rod Can, CNM 01/06/2022 10:57 AM

## 2022-01-10 ENCOUNTER — Telehealth: Payer: Self-pay

## 2022-01-10 NOTE — Telephone Encounter (Signed)
Pt called after hour nurse 01/08/22 8:39am; 38wks; having some vaginal bright bleeding; having moderate menstrual cramps; had vaginal exam Thurs.  After hour nurse paged [place] who adv pt to wait until ctxs are 76min or less apart or bleeding increases to be seen. [phone]  Pt states the bleeding is light brown now and is more like spotting.  We no need to work in today; to keep appt this Thurs.

## 2022-01-13 ENCOUNTER — Other Ambulatory Visit
Admission: RE | Admit: 2022-01-13 | Discharge: 2022-01-13 | Disposition: A | Discharge status: Home / Self Care | Payer: No Typology Code available for payment source | Source: Ambulatory Visit | Attending: Obstetrics and Gynecology | Admitting: Obstetrics and Gynecology

## 2022-01-13 ENCOUNTER — Encounter: Payer: Self-pay | Admitting: Obstetrics and Gynecology

## 2022-01-13 ENCOUNTER — Other Ambulatory Visit: Payer: Self-pay

## 2022-01-13 ENCOUNTER — Ambulatory Visit (INDEPENDENT_AMBULATORY_CARE_PROVIDER_SITE_OTHER): Payer: No Typology Code available for payment source | Admitting: Obstetrics and Gynecology

## 2022-01-13 DIAGNOSIS — Z01812 Encounter for preprocedural laboratory examination: Secondary | ICD-10-CM | POA: Insufficient documentation

## 2022-01-13 DIAGNOSIS — Z20822 Contact with and (suspected) exposure to covid-19: Secondary | ICD-10-CM

## 2022-01-13 DIAGNOSIS — Z3480 Encounter for supervision of other normal pregnancy, unspecified trimester: Secondary | ICD-10-CM

## 2022-01-13 NOTE — Progress Notes (Signed)
? ? ?Routine Prenatal Care Visit ? ?Subjective  ?Mary Richards is a 36 y.o. G3P1011 at [redacted]w[redacted]d being seen today for ongoing prenatal care.  She is currently monitored for the following issues for this low-risk pregnancy and has GAD (generalized anxiety disorder); Chronic low back pain; Bilateral impacted cerumen; Encounter for examination of blood pressure with abnormal findings; Supervision of other normal pregnancy, antepartum; Asthma during pregnancy; Obesity affecting pregnancy; and Decreased fetal movement on their problem list.  ?----------------------------------------------------------------------------------- ?Patient reports no complaints.   ?Contractions: Irritability. Vag. Bleeding: None.  Movement: Present. Denies leaking of fluid.  ?----------------------------------------------------------------------------------- ?The following portions of the patient's history were reviewed and updated as appropriate: allergies, current medications, past family history, past medical history, past social history, past surgical history and problem list. Problem list updated. ? ? ?Objective  ?Blood pressure 120/70, weight 233 lb 6.4 oz (105.9 kg), last menstrual period 03/30/2021, unknown if currently breastfeeding. ?Pregravid weight 200 lb (90.7 kg) Total Weight Gain 33 lb 6.4 oz (15.2 kg) ?Urinalysis:     ? ?Fetal Status: Fetal Heart Rate (bpm): 130 Fundal Height: 41 cm Movement: Present    ? ?General:  Alert, oriented and cooperative. Patient is in no acute distress.  ?Skin: Skin is warm and dry. No rash noted.   ?Cardiovascular: Normal heart rate noted  ?Respiratory: Normal respiratory effort, no problems with respiration noted  ?Abdomen: Soft, gravid, appropriate for gestational age. Pain/Pressure: Present     ?Pelvic:  Cervical exam deferred        ?Extremities: Normal range of motion.  Edema: None  ?Mental Status: Normal mood and affect. Normal behavior. Normal judgment and thought content.   ? ? ? ?Assessment  ? ?36 y.o. G3P1011 at [redacted]w[redacted]d by  01/18/2022, by Ultrasound presenting for routine prenatal visit ? ?Plan  ? ?pregnancy3 Problems (from 05/28/21 to present)   ? ? Problem Noted Resolved  ? Supervision of other normal pregnancy, antepartum 05/28/2021 by Will Bonnet, MD No  ? Overview Addendum 01/13/2022 10:31 AM by Homero Fellers, MD  ?   ?Nursing Staff Provider  ?Office Location  [facility] Dating   6 wk [place]  ?Language  English Anatomy US  complete  ?Flu Vaccine  08/24/21 Genetic Screen  NIPS: declined  ?TDaP vaccine   11/11/21 Hgb A1C or  ?GTT Early : hgba1c 5.1 ?Third trimester : 102  ?Covid Vaccinated   LAB RESULTS   ?Rhogam  Not needed Blood Type A/Positive/-- (07/15 3818)   ?Feeding Plan Breast Antibody Negative (07/15 0921)  ?Contraception condoms Rubella 3.78 (07/15 2993)  ?Circumcision yes RPR Non Reactive (12/12 1051)   ?Pediatrician  [facility] HBsAg Negative (07/15 7169)   ?Support Person Husband- Chayne HIV Non Reactive (12/12 1051)  ?Prenatal Classes Discussed Varicella Immune  ?  GBS Negative/-- (02/09 1037)   ?BTL Consent     ?VBAC Consent  Pap 2020 NIL  ?  Hgb Electro    ?  CF   ?   SMA   ?     ?Hx of PPD: Script for zoloft given  ?                   Needs 2 wk PP visit  ?  ?  ?  ? Asthma during pregnancy 05/28/2021 by Will Bonnet, MD No  ? Obesity affecting pregnancy 05/28/2021 by Will Bonnet, MD No  ? ?  ?  ? ?Gestational age appropriate obstetric precautions including but not limited to vaginal  bleeding, contractions, leaking of fluid and fetal movement were reviewed in detail with the patient.   ? ?Return if symptoms worsen or fail to improve. ? ?Homero Fellers MD ?[place], [place] ?01/13/2022, 10:43 AM ? ? ?

## 2022-01-13 NOTE — Patient Instructions (Signed)
Labor Induction ?Labor induction is when steps are taken to cause a pregnant woman to begin the labor process. Most women go into labor on their own between 37 weeks and 42 weeks of pregnancy. When this does not happen, or when there is a medical need for labor to begin, steps may be taken to induce, or bring on, labor. ?Labor induction causes a pregnant woman's uterus to contract. It also causes the cervix to soften (ripen), open (dilate), and thin out. Usually, labor is not induced before 39 weeks of pregnancy unless there is a medical reason to do so. ?When is labor induction considered? ?Labor induction may be right for you if: ?Your pregnancy lasts longer than 41 to 42 weeks. ?Your placenta is separating from your uterus (placental abruption). ?You have a rupture of membranes and your labor does not begin. ?You have health problems, like diabetes or high blood pressure (preeclampsia) during your pregnancy. ?Your baby has stopped growing or does not have enough amniotic fluid. ?Before labor induction begins, your health care provider will consider the following factors: ?Your medical condition and the baby's condition. ?How many weeks you have been pregnant. ?How mature the baby's lungs are. ?The condition of your cervix. ?The position of the baby. ?The size of your birth canal. ?Tell a health care provider about: ?Any allergies you have. ?All medicines you are taking, including vitamins, herbs, eye drops, creams, and over-the-counter medicines. ?Any problems you or your family members have had with anesthetic medicines. ?Any surgeries you have had. ?Any blood disorders you have. ?Any medical conditions you have. ?What are the risks? ?Generally, this is a safe procedure. However, problems may occur, including: ?Failed induction. ?Changes in fetal heart rate, such as being too high, too low, or irregular (erratic). ?Infection in the mother or the baby. ?Increased risk of having a cesarean delivery. ?Breaking off  (abruption) of the placenta from the uterus. This is rare. ?Rupture of the uterus. This is very rare. ?Your baby could fail to get enough blood flow or oxygen. This can be life-threatening. ?When induction is needed for medical reasons, the benefits generally outweigh the risks. ?What happens during the procedure? ?During the procedure, your health care provider will use one of these methods to induce labor: ?Stripping the membranes. In this method, the amniotic sac tissue is gently separated from the cervix. This causes the following to happen: ?Your cervix stretches, which in turn causes the release of prostaglandins. ?Prostaglandins induce labor and cause the uterus to contract. ?This procedure is often done in an office visit. You will be sent home to wait for contractions to begin. ?Prostaglandin medicine. This medicine starts contractions and causes the cervix to dilate and ripen. This can be taken by mouth (orally) or by being inserted into the vagina (suppository). ?Inserting a small, thin tube (catheter) with a balloon into the vagina and then expanding the balloon with water to dilate the cervix. ?Breaking the water. In this method, a small instrument is used to make a small hole in the amniotic sac. This eventually causes the amniotic sac to break. Contractions should begin within a few hours. ?Medicine to trigger or strengthen contractions. This medicine is given through an IV that is inserted into a vein in your arm. ?This procedure may vary among health care providers and hospitals. ?Where to find more information ?March of Dimes: www.marchofdimes.org ?[employer] of [employer]stetricians [employer]necologists: www.acog.org ?Summary ?Labor induction causes a pregnant woman's uterus to contract. It also causes the cervix  to soften (ripen), open (dilate), and thin out. ?Labor is usually not induced before 39 weeks of pregnancy unless there is a medical reason to do so. ?When induction is needed for medical  reasons, the benefits generally outweigh the risks. ?Talk with your health care provider about which methods of labor induction are right for you. ?This information is not intended to replace advice given to you by your health care provider. Make sure you discuss any questions you have with your health care provider. ?Document Revised: 08/13/2020 Document Reviewed: 08/13/2020 ?Elsevier Patient Education ? [place]. ? ?

## 2022-01-14 LAB — SARS CORONAVIRUS 2 (TAT 6-24 HRS): SARS Coronavirus 2: NEGATIVE

## 2022-01-15 ENCOUNTER — Inpatient Hospital Stay
Admission: EM | Admit: 2022-01-15 | Discharge: 2022-01-16 | DRG: 807 | Disposition: A | Discharge status: Home / Self Care | Payer: No Typology Code available for payment source | Attending: Obstetrics and Gynecology | Admitting: Obstetrics and Gynecology

## 2022-01-15 ENCOUNTER — Inpatient Hospital Stay: Payer: No Typology Code available for payment source | Admitting: Anesthesiology

## 2022-01-15 ENCOUNTER — Encounter: Payer: Self-pay | Admitting: Obstetrics and Gynecology

## 2022-01-15 ENCOUNTER — Other Ambulatory Visit: Payer: Self-pay

## 2022-01-15 DIAGNOSIS — O9952 Diseases of the respiratory system complicating childbirth: Secondary | ICD-10-CM | POA: Diagnosis present

## 2022-01-15 DIAGNOSIS — O4202 Full-term premature rupture of membranes, onset of labor within 24 hours of rupture: Secondary | ICD-10-CM

## 2022-01-15 DIAGNOSIS — O26893 Other specified pregnancy related conditions, third trimester: Secondary | ICD-10-CM | POA: Diagnosis present

## 2022-01-15 DIAGNOSIS — O99214 Obesity complicating childbirth: Secondary | ICD-10-CM | POA: Diagnosis present

## 2022-01-15 DIAGNOSIS — Z20822 Contact with and (suspected) exposure to covid-19: Secondary | ICD-10-CM | POA: Diagnosis present

## 2022-01-15 DIAGNOSIS — Z3490 Encounter for supervision of normal pregnancy, unspecified, unspecified trimester: Secondary | ICD-10-CM | POA: Diagnosis present

## 2022-01-15 DIAGNOSIS — Z3A39 39 weeks gestation of pregnancy: Secondary | ICD-10-CM

## 2022-01-15 DIAGNOSIS — J45909 Unspecified asthma, uncomplicated: Secondary | ICD-10-CM | POA: Diagnosis present

## 2022-01-15 DIAGNOSIS — Z3480 Encounter for supervision of other normal pregnancy, unspecified trimester: Secondary | ICD-10-CM

## 2022-01-15 LAB — CBC
HCT: 43.2 % (ref 36.0–46.0)
Hemoglobin: 14.3 g/dL (ref 12.0–15.0)
MCH: 29.5 pg (ref 26.0–34.0)
MCHC: 33.1 g/dL (ref 30.0–36.0)
MCV: 89.1 fL (ref 80.0–100.0)
Platelets: 303 10*3/uL (ref 150–400)
RBC: 4.85 MIL/uL (ref 3.87–5.11)
RDW: 14 % (ref 11.5–15.5)
WBC: 13.6 10*3/uL — ABNORMAL HIGH (ref 4.0–10.5)
nRBC: 0 % (ref 0.0–0.2)

## 2022-01-15 LAB — TYPE AND SCREEN
ABO/RH(D): A POS
Antibody Screen: NEGATIVE

## 2022-01-15 LAB — RPR: RPR Ser Ql: NONREACTIVE

## 2022-01-15 MED ORDER — FENTANYL CITRATE (PF) 100 MCG/2ML IJ SOLN
INTRAMUSCULAR | Status: DC | PRN
  Administered 2022-01-15: 50 ug via EPIDURAL

## 2022-01-15 MED ORDER — BUPIVACAINE HCL (PF) 0.25 % IJ SOLN
INTRAMUSCULAR | Status: DC | PRN
  Administered 2022-01-15: 8 mL via EPIDURAL

## 2022-01-15 MED ORDER — BUTORPHANOL TARTRATE 1 MG/ML IJ SOLN: 1.0000 mg | INTRAMUSCULAR | Status: DC | PRN

## 2022-01-15 MED ORDER — LIDOCAINE HCL (PF) 1 % IJ SOLN
INTRAMUSCULAR | Status: DC | PRN
  Administered 2022-01-15: 3 mL

## 2022-01-15 MED ORDER — LACTATED RINGERS IV SOLN
500.0000 mL | INTRAVENOUS | Status: DC | PRN
  Administered 2022-01-15: 500 mL via INTRAVENOUS

## 2022-01-15 MED ORDER — OXYTOCIN BOLUS FROM INFUSION
333.0000 mL | Freq: Once | INTRAVENOUS | Status: AC
  Administered 2022-01-15: 333 mL via INTRAVENOUS

## 2022-01-15 MED ORDER — ONDANSETRON HCL 4 MG/2ML IJ SOLN
4.0000 mg | Freq: Four times a day (QID) | INTRAMUSCULAR | Status: DC | PRN
  Administered 2022-01-15: 4 mg via INTRAVENOUS
  Filled 2022-01-15: qty 2

## 2022-01-15 MED ORDER — DIPHENHYDRAMINE HCL 25 MG PO CAPS: 25.0000 mg | ORAL_CAPSULE | Freq: Four times a day (QID) | ORAL | Status: DC | PRN

## 2022-01-15 MED ORDER — SERTRALINE HCL 25 MG PO TABS
25.0000 mg | ORAL_TABLET | Freq: Every day | ORAL | Status: DC
  Administered 2022-01-15 – 2022-01-16 (×2): 25 mg via ORAL
  Filled 2022-01-15 (×3): qty 1

## 2022-01-15 MED ORDER — LORATADINE 10 MG PO TABS
10.0000 mg | ORAL_TABLET | Freq: Every day | ORAL | Status: DC
  Administered 2022-01-16: 10 mg via ORAL
  Filled 2022-01-15 (×2): qty 1

## 2022-01-15 MED ORDER — ONDANSETRON HCL 4 MG/2ML IJ SOLN: 4.0000 mg | INTRAMUSCULAR | Status: DC | PRN

## 2022-01-15 MED ORDER — COCONUT OIL OIL
1.0000 "application " | TOPICAL_OIL | Status: DC | PRN
  Administered 2022-01-15: 1 via TOPICAL
  Filled 2022-01-15: qty 120

## 2022-01-15 MED ORDER — ONDANSETRON HCL 4 MG PO TABS: 4.0000 mg | ORAL_TABLET | ORAL | Status: DC | PRN

## 2022-01-15 MED ORDER — IBUPROFEN 600 MG PO TABS
600.0000 mg | ORAL_TABLET | Freq: Four times a day (QID) | ORAL | Status: DC
  Administered 2022-01-15 – 2022-01-16 (×5): 600 mg via ORAL
  Filled 2022-01-15 (×5): qty 1

## 2022-01-15 MED ORDER — DOCUSATE SODIUM 100 MG PO CAPS
100.0000 mg | ORAL_CAPSULE | Freq: Two times a day (BID) | ORAL | Status: DC
  Administered 2022-01-15 – 2022-01-16 (×2): 100 mg via ORAL
  Filled 2022-01-15 (×2): qty 1

## 2022-01-15 MED ORDER — FENTANYL CITRATE (PF) 100 MCG/2ML IJ SOLN
INTRAMUSCULAR | Status: AC
  Administered 2022-01-15: 50 ug
  Filled 2022-01-15: qty 2

## 2022-01-15 MED ORDER — DIBUCAINE (PERIANAL) 1 % EX OINT
1.0000 "application " | TOPICAL_OINTMENT | CUTANEOUS | Status: DC | PRN
  Filled 2022-01-15: qty 28

## 2022-01-15 MED ORDER — LACTATED RINGERS IV SOLN: INTRAVENOUS | Status: DC

## 2022-01-15 MED ORDER — ACETAMINOPHEN 500 MG PO TABS
1000.0000 mg | ORAL_TABLET | Freq: Four times a day (QID) | ORAL | Status: DC
  Administered 2022-01-15 – 2022-01-16 (×5): 1000 mg via ORAL
  Filled 2022-01-15 (×5): qty 2

## 2022-01-15 MED ORDER — BENZOCAINE-MENTHOL 20-0.5 % EX AERO
1.0000 "application " | INHALATION_SPRAY | CUTANEOUS | Status: DC | PRN
  Administered 2022-01-15: 1 via TOPICAL
  Filled 2022-01-15 (×2): qty 56

## 2022-01-15 MED ORDER — FLUTICASONE PROPIONATE 50 MCG/ACT NA SUSP
1.0000 | Freq: Every day | NASAL | Status: DC
  Filled 2022-01-15: qty 16

## 2022-01-15 MED ORDER — ACETAMINOPHEN 325 MG PO TABS
650.0000 mg | ORAL_TABLET | ORAL | Status: DC | PRN
Start: 2022-01-15 — End: 2022-01-15

## 2022-01-15 MED ORDER — FENTANYL-BUPIVACAINE-NACL 0.5-0.125-0.9 MG/250ML-% EP SOLN
EPIDURAL | Status: AC
  Filled 2022-01-15: qty 250

## 2022-01-15 MED ORDER — OXYTOCIN-SODIUM CHLORIDE 30-0.9 UT/500ML-% IV SOLN
2.5000 [IU]/h | INTRAVENOUS | Status: DC
Start: 2022-01-15 — End: 2022-01-15
  Filled 2022-01-15: qty 500

## 2022-01-15 MED ORDER — TERBUTALINE SULFATE 1 MG/ML IJ SOLN: 0.2500 mg | Freq: Once | INTRAMUSCULAR | Status: DC | PRN

## 2022-01-15 MED ORDER — LIDOCAINE HCL (PF) 1 % IJ SOLN
30.0000 mL | INTRAMUSCULAR | Status: AC | PRN
  Administered 2022-01-15: 30 mL via SUBCUTANEOUS

## 2022-01-15 MED ORDER — FENTANYL-BUPIVACAINE-NACL 0.5-0.125-0.9 MG/250ML-% EP SOLN
EPIDURAL | Status: DC | PRN
  Administered 2022-01-15: 12 mL/h via EPIDURAL

## 2022-01-15 MED ORDER — PRENATAL MULTIVITAMIN CH
1.0000 | ORAL_TABLET | Freq: Every day | ORAL | Status: DC
  Administered 2022-01-15: 1 via ORAL
  Filled 2022-01-15 (×3): qty 1

## 2022-01-15 MED ORDER — OXYTOCIN-SODIUM CHLORIDE 30-0.9 UT/500ML-% IV SOLN: 1.0000 m[IU]/min | INTRAVENOUS | Status: DC

## 2022-01-15 MED ORDER — ZOLPIDEM TARTRATE 5 MG PO TABS: 5.0000 mg | ORAL_TABLET | Freq: Every evening | ORAL | Status: DC | PRN

## 2022-01-15 MED ORDER — WITCH HAZEL-GLYCERIN EX PADS
1.0000 "application " | MEDICATED_PAD | CUTANEOUS | Status: DC | PRN
  Filled 2022-01-15: qty 100

## 2022-01-15 MED ORDER — AZELASTINE HCL 0.1 % NA SOLN
1.0000 | Freq: Two times a day (BID) | NASAL | Status: DC
  Filled 2022-01-15: qty 30

## 2022-01-15 MED ORDER — LIDOCAINE-EPINEPHRINE (PF) 1.5 %-1:200000 IJ SOLN
INTRAMUSCULAR | Status: DC | PRN
  Administered 2022-01-15: 3 mL via PERINEURAL

## 2022-01-15 MED ORDER — SIMETHICONE 80 MG PO CHEW: 80.0000 mg | CHEWABLE_TABLET | ORAL | Status: DC | PRN

## 2022-01-15 NOTE — H&P (Signed)
Mary Richards is an 36 y.o. female.   ?Chief Complaint: SROM ?HPI: Se presented to L&D early this morning with complaint of leakage of fluid. She is contracting. She made quick change and is feeling the urge to push.  ? ?pregnancy3 Problems (from 05/28/21 to present)   ? ? Problem Noted Resolved  ? Supervision of other normal pregnancy, antepartum 05/28/2021 by Will Bonnet, MD No  ? Overview Addendum 01/13/2022 10:31 AM by Homero Fellers, MD  ?   ?Nursing Staff Provider  ?Office Location  [facility] Dating   6 wk [place]  ?Language  English Anatomy US  complete  ?Flu Vaccine  08/24/21 Genetic Screen  NIPS: declined  ?TDaP vaccine   11/11/21 Hgb A1C or  ?GTT Early : hgba1c 5.1 ?Third trimester : 102  ?Covid Vaccinated   LAB RESULTS   ?Rhogam  Not needed Blood Type A/Positive/-- (07/15 1610)   ?Feeding Plan Breast Antibody Negative (07/15 0921)  ?Contraception condoms Rubella 3.78 (07/15 9604)  ?Circumcision yes RPR Non Reactive (12/12 1051)   ?Pediatrician  [facility] HBsAg Negative (07/15 5409)   ?Support Person Husband- Chayne HIV Non Reactive (12/12 1051)  ?Prenatal Classes Discussed Varicella Immune  ?  GBS Negative/-- (02/09 1037)   ?BTL Consent     ?VBAC Consent  Pap 2020 NIL  ?  Hgb Electro    ?  CF   ?   SMA   ?     ?Hx of PPD: Script for zoloft given  ?                   Needs 2 wk PP visit  ?  ?  ?  ? Asthma during pregnancy 05/28/2021 by Will Bonnet, MD No  ? Obesity affecting pregnancy 05/28/2021 by Will Bonnet, MD No  ? ?  ? ? ? ?Past Medical History:  ?Diagnosis Date  ? Asthma   ? Chronic migraine   ? Complication of anesthesia   ? DDD (degenerative disc disease), lumbosacral   ? Family history of adverse reaction to anesthesia   ? Mother and Father - PONV  ? PONV (postoperative nausea and vomiting)   ? Ruptured left tubal ectopic pregnancy causing hemoperitoneum 01/23/2021  ? Wears contact lenses   ? ? ?Past Surgical History:  ?Procedure Laterality Date  ? CHOLECYSTECTOMY     ? COLONOSCOPY WITH PROPOFOL N/A 02/19/2021  ? Procedure: COLONOSCOPY WITH PROPOFOL;  Surgeon: Jonathon Bellows, MD;  Location: [facility] ENDOSCOPY;  Service: Gastroenterology;  Laterality: N/A;  ? DIAGNOSTIC LAPAROSCOPY WITH REMOVAL OF ECTOPIC PREGNANCY Left 01/23/2021  ? Procedure: DIAGNOSTIC LAPAROSCOPY WITH REMOVAL OF ECTOPIC PREGNANCY;  Surgeon: Will Bonnet, MD;  Location: [facility] ORS;  Service: Gynecology;  Laterality: Left;  ? PILONIDAL CYST EXCISION    ? TONSILECTOMY, ADENOIDECTOMY, BILATERAL MYRINGOTOMY AND TUBES    ? TYMPANOPLASTY Right 05/10/2019  ? Procedure: TYMPANOPLASTY;  Surgeon: Beverly Gust, MD;  Location: [facility];  Service: ENT;  Laterality: Right;  ? ? ?Family History  ?Problem Relation Age of Onset  ? Brain cancer Father 52  ? Valvular heart disease Sister   ? Brain cancer Paternal Grandmother 76  ? COPD Maternal Grandfather   ? Irritable bowel syndrome Mother   ? Thyroid cancer Neg Hx   ? Colon cancer Neg Hx   ? ?Social History:  reports that she has never smoked. She has never used smokeless tobacco. She reports current alcohol use of about 2.0 standard drinks per  week. She reports that she does not use drugs. ? ?Allergies:  ?Allergies  ?Allergen Reactions  ? Azithromycin Hives, Shortness Of Breath and Swelling  ?  Throat swelling  ? Sulfa Antibiotics Hives  ? Ciprofloxacin Swelling  ? Moxifloxacin Hcl Hives  ? Bactrim [Sulfamethoxazole-Trimethoprim] Rash  ? Wound Dressing Adhesive Rash  ?  Dermabond  ? ? ?Medications Prior to Admission  ?Medication Sig Dispense Refill  ? albuterol (VENTOLIN HFA) 108 (90 Base) MCG/ACT inhaler INHALE 2 PUFFS INTO THE LUNGS EVERY 6 HOURS AS NEEDED FOR WHEEZING OR SHORTNESS OF BREATH. 18 g 1  ? azelastine (ASTELIN) 0.1 % nasal spray Place 1 spray into both nostrils 2 (two) times daily. Use in each nostril as directed 30 mL 3  ? cetirizine (ZYRTEC) 10 MG tablet Take 10 mg by mouth daily.    ? ferrous sulfate 325 (65 FE) MG tablet Take 325 mg by mouth daily  with breakfast.    ? fluticasone (FLONASE) 50 MCG/ACT nasal spray Place into both nostrils daily.    ? Prenatal Vit-Fe Fumarate-FA (PRENATAL VITAMIN) 27-0.8 MG TABS Take 1 tablet by mouth daily.    ? sertraline (ZOLOFT) 25 MG tablet Take 1 tablet (25 mg total) by mouth daily. Take '25mg'$  daily for 1 to 2 weeks.  May increases to 50 mg (2 tablets) daily if needed 52 tablet 1  ? ? ?Results for orders placed or performed during the hospital encounter of 01/15/22 (from the past 48 hour(s))  ?CBC     Status: Abnormal  ? Collection Time: 01/15/22  4:52 AM  ?Result Value Ref Range  ? WBC 13.6 (H) 4.0 - 10.5 K/uL  ? RBC 4.85 3.87 - 5.11 MIL/uL  ? Hemoglobin 14.3 12.0 - 15.0 g/dL  ? HCT 43.2 36.0 - 46.0 %  ? MCV 89.1 80.0 - 100.0 fL  ? MCH 29.5 26.0 - 34.0 pg  ? MCHC 33.1 30.0 - 36.0 g/dL  ? RDW 14.0 11.5 - 15.5 %  ? Platelets 303 150 - 400 K/uL  ? nRBC 0.0 0.0 - 0.2 %  ?  Comment: Performed at [facility], [address], [place], [place] [address]  ?Type and screen     Status: None (Preliminary result)  ? Collection Time: 01/15/22  4:52 AM  ?Result Value Ref Range  ? ABO/RH(D) PENDING   ? Antibody Screen PENDING   ? Sample Expiration    ?  01/18/2022,2359 ?Performed at [facility], [address], [place],  [address] ?  ? ?No results found. ? ?Review of Systems  ?Constitutional:  Negative for chills and fever.  ?HENT:  Negative for congestion, hearing loss and sinus pain.   ?Respiratory:  Negative for cough, shortness of breath and wheezing.   ?Cardiovascular:  Negative for chest pain, palpitations and leg swelling.  ?Gastrointestinal:  Negative for abdominal pain, constipation, diarrhea, nausea and vomiting.  ?Genitourinary:  Negative for dysuria, flank pain, frequency, hematuria and urgency.  ?Musculoskeletal:  Negative for back pain.  ?Skin:  Negative for rash.  ?Neurological:  Negative for dizziness and headaches.  ?Psychiatric/Behavioral:  Negative for suicidal ideas. The patient is not  nervous/anxious.   ? ?Blood pressure 120/90, pulse (!) 55, temperature 97.7 ?F (36.5 ?C), temperature source Oral, resp. rate 20, height '5\' 6"'$  (1.676 m), weight 105.9 kg, last menstrual period 03/30/2021, unknown if currently breastfeeding. ?Physical Exam ?Vitals and nursing note reviewed.  ?Constitutional:   ?   Appearance: Normal appearance. She is well-developed.  ?HENT:  ?  Head: Normocephalic and atraumatic.  ?Cardiovascular:  ?   Rate and Rhythm: Normal rate and regular rhythm.  ?Pulmonary:  ?   Effort: Pulmonary effort is normal.  ?   Breath sounds: Normal breath sounds.  ?Abdominal:  ?   General: Bowel sounds are normal.  ?   Palpations: Abdomen is soft.  ?Musculoskeletal:     ?   General: Normal range of motion.  ?Skin: ?   General: Skin is warm and dry.  ?Neurological:  ?   Mental Status: She is alert and oriented to person, place, and time.  ?Psychiatric:     ?   Behavior: Behavior normal.     ?   Thought Content: Thought content normal.     ?   Judgment: Judgment normal.  ?  ?NST: 130 bpm baseline, moderate variability, 15x15 accelerations, no decelerations. ?Tocometer : every 2 minutes ? ? ?Assessment/Plan ?36 y.o. [id number] 69w4dhere for active labor ? ?Expectant management of labor,  ?Normal fetal monitoring per unit policy. ?Reviewed option for pain management with patient. Patient does desire an epidural.  ?Typical admission labs. ?GBS: negative ? ?CAdrian ProwsMD, FACOG ?[facility], [facility] ?01/15/2022 ?5:36 AM ? ?

## 2022-01-15 NOTE — Discharge Summary (Signed)
? ?  Postpartum Discharge Summary ? ?Date of Service updated 01/16/2022 ? ?Patient Name: Mary Richards ?DOB: [birth year] ?MRN: [id number] ? ?Date of admission: 01/15/2022 ?Delivery date:01/15/2022  ?Delivering provider: Adrian Prows R  ?Date of discharge: 01/16/2022 ? ?Admitting diagnosis: Encounter for elective induction of labor [Z34.90] ?Intrauterine pregnancy: [redacted]w[redacted]d    ?Secondary diagnosis:  Principal Problem: ?  Encounter for elective induction of labor ? ?Additional problems: NA    ?Discharge diagnosis: Term Pregnancy Delivered                                              ?Post partum procedures:NA ?Augmentation: N/A ?Complications: None ? ?Hospital course: Onset of Labor With Vaginal Delivery      ?36y.o. yo G3P1011 at 362w4das admitted in [facility] 01/15/2022. Patient had an uncomplicated labor course as follows:  ?Membrane Rupture Time/Date: 3:45 AM ,01/15/2022   ?Delivery Method:Vaginal, Spontaneous  ?Episiotomy: None  ?Lacerations:  2nd degree;Labial  ?Patient had an uncomplicated postpartum course.  She is ambulating, tolerating a regular diet, passing flatus, and urinating well. Breastfeeding independently, infant has a shallow latch-but mother is confident in adjusting latch. Patient is discharged home in stable condition on 01/16/22. ? ?Newborn Data: ?Birth date:01/15/2022  ?Birth time:6:12 AM  ?Gender:Female  ?Living status:Living  ?Apgars:8 ,9  ?Weight:3630 g  ? ?Magnesium Sulfate received: No ?BMZ received: No ?Rhophylac:No ?MMR:No ?T-DaP:Given prenatally ?Flu: No ?Transfusion:No ? ?Physical exam  ?Vitals:  ? 01/15/22 1530 01/15/22 1942 01/15/22 2318 01/16/22 [id number]?BP: 125/64 122/66 129/70 122/66  ?Pulse: 74 71 71 77  ?Resp: '18 18 19 18  ' ?Temp: 98.3 ?F (36.8 ?C) (!) 97.5 ?F (36.4 ?C) 98.2 ?F (36.8 ?C) 98.6 ?F (37 ?C)  ?TempSrc: Oral  Oral Oral  ?SpO2:  98% 98% 97%  ?Weight:      ?Height:      ? ?General: alert ?Breasts: soft, nipples erect and intact bilaterally ?Lochia: appropriate ?Uterine  Fundus: firm ?Incision: N/A ?Perineum: slightly swollen ?Extremities:: no edema  ?DVT Evaluation: No evidence of DVT seen on physical exam. ?Labs: ?Lab Results  ?Component Value Date  ? WBC 13.3 (H) 01/16/2022  ? HGB 11.8 (L) 01/16/2022  ? HCT 35.7 (L) 01/16/2022  ? MCV 89.7 01/16/2022  ? PLT 263 01/16/2022  ? ?CMP Latest Ref Rng & Units 01/23/2021  ?Glucose 70 - 99 mg/dL 95  ?BUN 6 - 20 mg/dL 13  ?Creatinine 0.44 - 1.00 mg/dL 0.64  ?Sodium 135 - 145 mmol/L 138  ?Potassium 3.5 - 5.1 mmol/L 3.8  ?Chloride 98 - 111 mmol/L 106  ?CO2 22 - 32 mmol/L 23  ?Calcium 8.9 - 10.3 mg/dL 9.3  ?Total Protein 6.5 - 8.1 g/dL 7.0  ?Total Bilirubin 0.3 - 1.2 mg/dL 1.2  ?Alkaline Phos 38 - 126 U/L 74  ?AST 15 - 41 U/L 19  ?ALT 0 - 44 U/L 15  ? ?Edinburgh Score: ?Edinburgh Postnatal Depression Scale Screening Tool 01/15/2022  ?I have been able to laugh and see the funny side of things. 0  ?I have looked forward with enjoyment to things. 1  ?I have blamed myself unnecessarily when things went wrong. 1  ?I have been anxious or worried for no good reason. 2  ?I have felt scared or panicky for no good reason. 1  ?Things have been getting on top of me. 1  ?I  have been so unhappy that I have had difficulty sleeping. 1  ?I have felt sad or miserable. 1  ?I have been so unhappy that I have been crying. 0  ?The thought of harming myself has occurred to me. 0  ?Edinburgh Postnatal Depression Scale Total 8  ? ? ? ? ?After visit meds:  ?Allergies as of 01/16/2022   ? ?   Reactions  ? Azithromycin Hives, Shortness Of Breath, Swelling  ? Throat swelling  ? Sulfa Antibiotics Hives  ? Ciprofloxacin Swelling  ? Moxifloxacin Hcl Hives  ? Bactrim [sulfamethoxazole-trimethoprim] Rash  ? Wound Dressing Adhesive Rash  ? Dermabond  ? ?  ? ?  ?Medication List  ?  ? ?STOP taking these medications   ? ?ferrous sulfate 325 (65 FE) MG tablet ?  ? ?  ? ?TAKE these medications   ? ?acetaminophen 500 MG tablet ?Commonly known as: TYLENOL ?Take 2 tablets (1,000 mg total)  by mouth every 6 (six) hours. ?  ?albuterol 108 (90 Base) MCG/ACT inhaler ?Commonly known as: VENTOLIN HFA ?INHALE 2 PUFFS INTO THE LUNGS EVERY 6 HOURS AS NEEDED FOR WHEEZING OR SHORTNESS OF BREATH. ?  ?azelastine 0.1 % nasal spray ?Commonly known as: ASTELIN ?Place 1 spray into both nostrils 2 (two) times daily. Use in each nostril as directed ?  ?cetirizine 10 MG tablet ?Commonly known as: ZYRTEC ?Take 10 mg by mouth daily. ?  ?coconut oil Oil ?Apply 1 application topically as needed. ?  ?dibucaine 1 % Oint ?Commonly known as: NUPERCAINAL ?Place 1 application rectally as needed for hemorrhoids. ?  ?fluticasone 50 MCG/ACT nasal spray ?Commonly known as: FLONASE ?Place into both nostrils daily. ?  ?ibuprofen 600 MG tablet ?Commonly known as: ADVIL ?Take 1 tablet (600 mg total) by mouth every 6 (six) hours. ?  ?Prenatal Vitamin 27-0.8 MG Tabs ?Take 1 tablet by mouth daily. ?  ?sertraline 25 MG tablet ?Commonly known as: Zoloft ?Take one tablet by mouth daily for 1 to 2 weeks. May increase to 2 tablets (20m) daily if needed ?(Take 1 tablet (25 mg total) by mouth daily. Take 268mdaily for 1 to 2 weeks.  May increases to 50 mg (2 tablets) daily if needed) ?  ?simethicone 80 MG chewable tablet ?Commonly known as: MYLICON ?Chew 1 tablet (80 mg total) by mouth as needed for flatulence. ?  ?witch hazel-glycerin pad ?Commonly known as: TUCKS ?Apply 1 application topically as needed for hemorrhoids. ?  ? ?  ? ? ? ?Discharge home in stable condition ?Infant Feeding: Breast ?Infant Disposition:home with mother ?Discharge instruction: per After Visit Summary and Postpartum booklet. ?Activity: Advance as tolerated. Pelvic rest for 6 weeks.  ?Diet: routine diet ?Anticipated Birth Control: Condoms ?Postpartum Appointment:2 weeks ?Additional Postpartum F/U: Postpartum Depression checkup ?Future Appointments:No future appointments. ?Follow up Visit: ? Follow-up Information   ? ? Schuman, ChStefanie LibelMD Follow up in 2 week(s).    ?Specialty: Obstetrics and Gynecology ?Why: postpartum visit ?Contact information: [phone] ? ?  ?  ? ?  ?  ? ?  ? ? ? ?  ? ?01/16/2022 ?Walther Sanagustin M Corbyn Steedman, CNM ? ? ?

## 2022-01-15 NOTE — Anesthesia Preprocedure Evaluation (Addendum)
Anesthesia Evaluation  ?Patient identified by MRN, date of birth, ID band ?Patient awake ? ? ? ?History of Anesthesia Complications ?(+) PONV and history of anesthetic complications ? ?Airway ?Mallampati: II ? ?TM Distance: >3 FB ?Neck ROM: Full ? ? ? Dental ?no notable dental hx. ? ?  ?Pulmonary ?asthma ,  ?  ?Pulmonary exam normal ? ? ? ? ? ? ? Cardiovascular ?negative cardio ROS ?Normal cardiovascular exam ? ? ?  ?Neuro/Psych ? Headaches, PSYCHIATRIC DISORDERS Anxiety Depression   ? GI/Hepatic ?negative GI ROS, Neg liver ROS,   ?Endo/Other  ?negative endocrine ROS ? Renal/GU ?negative Renal ROS  ?negative genitourinary ?  ?Musculoskeletal ? ?(+) Arthritis  (Ddd with chronic LBP and occasional radicular symptoms),  ? Abdominal ?  ?Peds ?negative pediatric ROS ?(+)  Hematology ?negative hematology ROS ?(+)   ?Anesthesia Other Findings ? ? Reproductive/Obstetrics ? ?  ? ? ? ? ? ? ? ? ? ? ? ? ? ?  ?  ? ? ? ? ? ? ? ?Anesthesia Physical ?Anesthesia Plan ? ?ASA: 2 ? ?Anesthesia Plan: Epidural  ? ?Post-op Pain Management:   ? ?Induction:  ? ?PONV Risk Score and Plan: 4 or greater ? ?Airway Management Planned:  ? ?Additional Equipment:  ? ?Intra-op Plan:  ? ?Post-operative Plan:  ? ?Informed Consent: I have reviewed the patients History and Physical, chart, labs and discussed the procedure including the risks, benefits and alternatives for the proposed anesthesia with the patient or authorized representative who has indicated his/her understanding and acceptance.  ? ? ? ? ? ?Plan Discussed with: Surgeon ? ?Anesthesia Plan Comments:   ? ? ? ? ? ? ?Anesthesia Quick Evaluation ? ?

## 2022-01-15 NOTE — Lactation Note (Signed)
This note was copied from a baby's chart. ?Lactation Consultation Note ? ?Patient Name: Boy Carren Blakley ?Today's Date: 01/15/2022 ?Reason for consult: Initial assessment ?Age:35 hours ?Lactation to the room for initial visit. Mother is resting in bed, baby is asleep in bassinet.  Encouraged feeding on demand and with cues. If baby is not cueing encouraged hand expression and skin to skin. Encouraged 8 or more attempts in the first 24 hours and 8 or more good feeds after 24 HOL. Reviewed appropriate diapers for days of life and How to know your baby is getting enough to eat. Reviewed "Understanding Postpartum and Newborn Care" booklet at bedside. Mother stated baby fed less than 2 hours ago, and she feels like baby is latching shallow. Reviewed how to achieve a deeper latch, and possible better positioning with baby at breast. Mother has no further questions at this time.  ?Paperwork and spreadsheet completed for employee pump. Mother chose the Medela freestyle flex pump for home.  ? ?Maternal Data ?Has patient been taught Hand Expression?: Yes ?Does the patient have breastfeeding experience prior to this delivery?: Yes ?How long did the patient breastfeed?: 8 months ? ?Feeding ?Mother's Current Feeding Choice: Breast Milk ? ?Interventions ?Interventions: Breast feeding basics reviewed;Pace feeding (did paperworl for personal employee pump) ? ?Discharge ?Discharge Education: Engorgement and breast care;Warning signs for feeding baby ?Pump: Employee Pump ? ?Consult Status ?Consult Status: PRN ? ? ? ?Travarus Trudo D Khayden Herzberg ?01/15/2022, 4:21 PM ? ? ? ?

## 2022-01-15 NOTE — Anesthesia Procedure Notes (Signed)
Epidural ?Patient location during procedure: OB ?Start time: 01/15/2022 5:27 AM ? ?Staffing ?Anesthesiologist: Scarlett Presto, MD ?Performed: anesthesiologist  ? ?Preanesthetic Checklist ?Completed: patient identified, IV checked, site marked, risks and benefits discussed, surgical consent, monitors and equipment checked, pre-op evaluation and timeout performed ? ?Epidural ?Patient position: sitting ?Prep: Betadine ?Patient monitoring: heart rate, cardiac monitor, continuous pulse ox and blood pressure ?Approach: midline ?Location: L3-L4 ?Injection technique: LOR saline ? ?Needle:  ?Needle gauge: 18 G ?Needle length: 9 cm ?Needle insertion depth: 5 cm ?Catheter size: 20 Guage ?Catheter at skin depth: 9 cm ?Test dose: 1.5% lidocaine with Epi 1:200 K ? ?Assessment ?Sensory level: T10 ?Events: blood not aspirated, injection not painful, no injection resistance, no paresthesia and negative IV test ? ?Additional Notes ?Reason for block:at surgeon's request and procedure for pain ? ? ? ?

## 2022-01-16 LAB — CBC
HCT: 35.7 % — ABNORMAL LOW (ref 36.0–46.0)
Hemoglobin: 11.8 g/dL — ABNORMAL LOW (ref 12.0–15.0)
MCH: 29.6 pg (ref 26.0–34.0)
MCHC: 33.1 g/dL (ref 30.0–36.0)
MCV: 89.7 fL (ref 80.0–100.0)
Platelets: 263 10*3/uL (ref 150–400)
RBC: 3.98 MIL/uL (ref 3.87–5.11)
RDW: 14.3 % (ref 11.5–15.5)
WBC: 13.3 10*3/uL — ABNORMAL HIGH (ref 4.0–10.5)
nRBC: 0 % (ref 0.0–0.2)

## 2022-01-16 MED ORDER — IBUPROFEN 600 MG PO TABS
600.0000 mg | ORAL_TABLET | Freq: Four times a day (QID) | ORAL | 0 refills | Status: DC
Start: 2022-01-16 — End: 2023-01-02

## 2022-01-16 MED ORDER — COCONUT OIL OIL: 1.0000 "application " | TOPICAL_OIL | 0 refills | Status: DC | PRN

## 2022-01-16 MED ORDER — WITCH HAZEL-GLYCERIN EX PADS: 1.0000 "application " | MEDICATED_PAD | CUTANEOUS | 12 refills | Status: DC | PRN

## 2022-01-16 MED ORDER — SIMETHICONE 80 MG PO CHEW
80.0000 mg | CHEWABLE_TABLET | ORAL | 0 refills | Status: DC | PRN
Start: 2022-01-16 — End: 2022-08-09

## 2022-01-16 MED ORDER — DIBUCAINE (PERIANAL) 1 % EX OINT: 1.0000 "application " | TOPICAL_OINTMENT | CUTANEOUS | Status: DC | PRN

## 2022-01-16 MED ORDER — ACETAMINOPHEN 500 MG PO TABS: 1000.0000 mg | ORAL_TABLET | Freq: Four times a day (QID) | ORAL | 0 refills | Status: DC

## 2022-01-16 NOTE — Discharge Instructions (Signed)
Discharge Instructions:  ? ?Follow-up Appointment: Call and schedule your follow-up appointment for a visit in 2-weeks with Dr. Gilman Schmidt at [facility]!  ? ?If there are any new medications, they have been ordered and will be available for pickup at the listed pharmacy on your way home from the hospital.  ? ?Call office if you have any of the following: headache, visual changes, fever >101.0 F, chills, shortness of breath, breast concerns, excessive vaginal bleeding, incision drainage or problems, leg pain or redness, depression or any other concerns. If you have vaginal discharge with an odor, let your doctor know.  ? ?It is normal to bleed for up to 6 weeks. You should not soak through more than 1 pad in 1 hour. If you have a blood clot larger than your fist with continued bleeding, call your doctor.  ? ?Activity: Do not lift > 10 lbs for 6 weeks (do not lift anything heavier than your baby). ?No intercourse, tampons, swimming pools, hot tubs, baths (only showers) for 6 weeks.  ?No driving for 1-2 weeks. ?Continue prenatal vitamin, especially if breastfeeding. ?Increase calories and fluids (water) while breastfeeding.  ? ?Your milk will come in, in the next couple of days (right now it is colostrum). You may have a slight fever when your milk comes in, but it should go away on its own.  If it does not, and rises above 101 F please call the doctor. You will also feel achy and your breasts will be firm. They will also start to leak. If you are breastfeeding, continue as you have been and you can pump/express milk for comfort.  ? ?If you have too much milk, your breasts can become engorged, which could lead to mastitis. This is an infection of the milk ducts. It can be very painful and you will need to notify your doctor to obtain a prescription for antibiotics. You can also treat it with a shower or hot/cold compress.  ? ?For concerns about your baby, please call your pediatrician.  ?For breastfeeding concerns,  the lactation consultant can be reached at [phone].  ? ?Postpartum blues (feelings of happy one minute and sad another minute) are normal for the first few weeks but if it gets worse let your doctor know.  ? ?Congratulations! We enjoyed caring for you and your new bundle of joy!   ?

## 2022-01-16 NOTE — Anesthesia Postprocedure Evaluation (Signed)
Anesthesia Post Note ? ?Patient: Mary Richards ? ?Procedure(s) Performed: AN AD HOC LABOR EPIDURAL ? ?Patient location during evaluation: Mother Baby ?Anesthesia Type: Epidural ?Level of consciousness: awake and alert ?Pain management: pain level controlled ?Vital Signs Assessment: post-procedure vital signs reviewed and stable ?Respiratory status: spontaneous breathing, nonlabored ventilation and respiratory function stable ?Cardiovascular status: stable ?Postop Assessment: no headache, no backache and epidural receding ?Anesthetic complications: no ? ? ?No notable events documented. ? ? ?Last Vitals:  ?Vitals:  ? 01/15/22 2318 01/16/22 0823  ?BP: 129/70 122/66  ?Pulse: 71 77  ?Resp: 19 18  ?Temp: 36.8 ?C 37 ?C  ?SpO2: 98% 97%  ?  ?Last Pain:  ?Vitals:  ? 01/16/22 0900  ?TempSrc:   ?PainSc: 0-No pain  ? ? ?  ?  ?  ?  ?  ?  ? ?Arita Miss ? ? ? ? ?

## 2022-01-16 NOTE — Progress Notes (Signed)
Patient discharged home with infant. Discharge instructions and prescriptions given and reviewed with patient. Patient verbalized understanding.  ? ?Follow-up appointment will be scheduled by patient for a visit in 2 weeks with Dr. Gilman Schmidt.  ? ?Will be escorted out by staff.  ?

## 2022-01-18 ENCOUNTER — Inpatient Hospital Stay: Admit: 2022-01-18 | Payer: Self-pay

## 2022-01-31 ENCOUNTER — Other Ambulatory Visit: Payer: Self-pay

## 2022-01-31 ENCOUNTER — Ambulatory Visit (INDEPENDENT_AMBULATORY_CARE_PROVIDER_SITE_OTHER): Payer: No Typology Code available for payment source | Admitting: Obstetrics and Gynecology

## 2022-01-31 ENCOUNTER — Encounter: Payer: Self-pay | Admitting: Obstetrics and Gynecology

## 2022-01-31 NOTE — Progress Notes (Signed)
? ?Postpartum Visit  ?Chief Complaint:  ?Chief Complaint  ?Patient presents with  ? Postpartum Care  ? ? ?History of Present Illness: Patient is a 36 y.o. [id number] presents for postpartum visit. ? ?Date of delivery: 01/15/2022 ?Type of delivery: Vaginal ?Laceration: 2nd degree perineal and bilateral periurethral  ? ?Breast Feeding:  yes ?Lochia: normal  ?Edinburgh Post-Partum Depression Score: 6  ?Date of last PAP: 11/2018 NIL   ? ?She reports she has been feeling well. Breastfeeding without issue ? ?Newborn Details:  ?SINGLETON :  ?1. Infant Status: Infant doing well at home with mother. ? ? ?Review of Systems: Review of Systems  ?Constitutional:  Negative for chills, fever, malaise/fatigue and weight loss.  ?HENT:  Negative for congestion, hearing loss and sinus pain.   ?Eyes:  Negative for blurred vision and double vision.  ?Respiratory:  Negative for cough, sputum production, shortness of breath and wheezing.   ?Cardiovascular:  Negative for chest pain, palpitations, orthopnea and leg swelling.  ?Gastrointestinal:  Negative for abdominal pain, constipation, diarrhea, nausea and vomiting.  ?Genitourinary:  Negative for dysuria, flank pain, frequency, hematuria and urgency.  ?Musculoskeletal:  Negative for back pain, falls and joint pain.  ?Skin:  Negative for itching and rash.  ?Neurological:  Negative for dizziness and headaches.  ?Psychiatric/Behavioral:  Negative for depression, substance abuse and suicidal ideas. The patient is not nervous/anxious.   ? ?Past Medical History:  ?Past Medical History:  ?Diagnosis Date  ? Asthma   ? Chronic migraine   ? Complication of anesthesia   ? DDD (degenerative disc disease), lumbosacral   ? Family history of adverse reaction to anesthesia   ? Mother and Father - PONV  ? PONV (postoperative nausea and vomiting)   ? Ruptured left tubal ectopic pregnancy causing hemoperitoneum 01/23/2021  ? Wears contact lenses   ? ? ?Past Surgical History:  ?Past Surgical History:  ?Procedure  Laterality Date  ? CHOLECYSTECTOMY    ? COLONOSCOPY WITH PROPOFOL N/A 02/19/2021  ? Procedure: COLONOSCOPY WITH PROPOFOL;  Surgeon: Jonathon Bellows, MD;  Location: [facility] ENDOSCOPY;  Service: Gastroenterology;  Laterality: N/A;  ? DIAGNOSTIC LAPAROSCOPY WITH REMOVAL OF ECTOPIC PREGNANCY Left 01/23/2021  ? Procedure: DIAGNOSTIC LAPAROSCOPY WITH REMOVAL OF ECTOPIC PREGNANCY;  Surgeon: Will Bonnet, MD;  Location: [facility] ORS;  Service: Gynecology;  Laterality: Left;  ? PILONIDAL CYST EXCISION    ? TONSILECTOMY, ADENOIDECTOMY, BILATERAL MYRINGOTOMY AND TUBES    ? TYMPANOPLASTY Right 05/10/2019  ? Procedure: TYMPANOPLASTY;  Surgeon: Beverly Gust, MD;  Location: [facility];  Service: ENT;  Laterality: Right;  ? ? ?Family History:  ?Family History  ?Problem Relation Age of Onset  ? Brain cancer Father 76  ? Valvular heart disease Sister   ? Brain cancer Paternal Grandmother 39  ? COPD Maternal Grandfather   ? Irritable bowel syndrome Mother   ? Thyroid cancer Neg Hx   ? Colon cancer Neg Hx   ? ? ?Social History:  ?Social History  ? ?Socioeconomic History  ? Marital status: Married  ?  Spouse name: Geryl Dohn  ? Number of children: Not on file  ? Years of education: Not on file  ? Highest education level: Not on file  ?Occupational History  ? Not on file  ?Tobacco Use  ? Smoking status: Never  ? Smokeless tobacco: Never  ?Vaping Use  ? Vaping Use: Never used  ?Substance and Sexual Activity  ? Alcohol use: Yes  ?  Alcohol/week: 2.0 standard drinks  ?  Types: 2  Standard drinks or equivalent per week  ?  Comment: Social   ? Drug use: Never  ? Sexual activity: Yes  ?  Birth control/protection: Condom  ?  Comment: Unknown  ?Other Topics Concern  ? Not on file  ?Social History Narrative  ? Works as Designer, multimedia in ICU  ? 2 children  ? ?Social Determinants of Health  ? ?Financial Resource Strain: Not on file  ?Food Insecurity: Not on file  ?Transportation Needs: Not on file  ?Physical Activity: Not on file  ?Stress: Not on file   ?Social Connections: Not on file  ?Intimate Partner Violence: Not on file  ? ? ?Allergies:  ?Allergies  ?Allergen Reactions  ? Azithromycin Hives, Shortness Of Breath and Swelling  ?  Throat swelling  ? Sulfa Antibiotics Hives  ? Ciprofloxacin Swelling  ? Moxifloxacin Hcl Hives  ? Bactrim [Sulfamethoxazole-Trimethoprim] Rash  ? Wound Dressing Adhesive Rash  ?  Dermabond  ? ? ?Medications: ?Prior to Admission medications   ?Medication Sig Start Date End Date Taking? Authorizing Provider  ?acetaminophen (TYLENOL) 500 MG tablet Take 2 tablets (1,000 mg total) by mouth every 6 (six) hours. 01/16/22  Yes Dominic, Nunzio Cobbs, CNM  ?azelastine (ASTELIN) 0.1 % nasal spray Place 1 spray into both nostrils 2 (two) times daily. Use in each nostril as directed 12/14/21  Yes Arnett, Yvetta Coder, FNP  ?cetirizine (ZYRTEC) 10 MG tablet Take 10 mg by mouth daily.   Yes [provider]  ?coconut oil OIL Apply 1 application topically as needed. 01/16/22  Yes Dominic, Nunzio Cobbs, CNM  ?dibucaine (NUPERCAINAL) 1 % OINT Place 1 application rectally as needed for hemorrhoids. 01/16/22  Yes Dominic, Nunzio Cobbs, CNM  ?fluticasone (FLONASE) 50 MCG/ACT nasal spray Place into both nostrils daily.   Yes [provider]  ?ibuprofen (ADVIL) 600 MG tablet Take 1 tablet (600 mg total) by mouth every 6 (six) hours. 01/16/22  Yes Dominic, Nunzio Cobbs, CNM  ?Prenatal Vit-Fe Fumarate-FA (PRENATAL VITAMIN) 27-0.8 MG TABS Take 1 tablet by mouth daily.   Yes [provider]  ?sertraline (ZOLOFT) 25 MG tablet Take 1 tablet (25 mg total) by mouth daily. Take '25mg'$  daily for 1 to 2 weeks.  May increases to 50 mg (2 tablets) daily if needed 11/11/21  Yes Dominic, Nunzio Cobbs, CNM  ?simethicone (MYLICON) 80 MG chewable tablet Chew 1 tablet (80 mg total) by mouth as needed for flatulence. 01/16/22  Yes Dominic, Nunzio Cobbs, CNM  ?witch hazel-glycerin (TUCKS) pad Apply 1 application topically as needed for hemorrhoids. 01/16/22  Yes Dominic,  Nunzio Cobbs, CNM  ?albuterol (VENTOLIN HFA) 108 (90 Base) MCG/ACT inhaler INHALE 2 PUFFS INTO THE LUNGS EVERY 6 HOURS AS NEEDED FOR WHEEZING OR SHORTNESS OF BREATH. 01/12/21 01/12/22  Burnard Hawthorne, FNP  ? ? ?Physical Exam ?Vitals:  ?Vitals:  ? 01/31/22 1308  ?BP: 120/70  ? ? ?Physical Exam ?Constitutional:   ?   Appearance: Normal appearance. She is well-developed.  ?HENT:  ?   Head: Normocephalic and atraumatic.  ?Eyes:  ?   Extraocular Movements: Extraocular movements intact.  ?   Pupils: Pupils are equal, round, and reactive to light.  ?Neck:  ?   Thyroid: No thyromegaly.  ?Cardiovascular:  ?   Rate and Rhythm: Normal rate and regular rhythm.  ?   Heart sounds: Normal heart sounds.  ?Pulmonary:  ?   Effort: Pulmonary effort is normal.  ?   Breath sounds: Normal breath sounds.  ?Abdominal:  ?   General:  Bowel sounds are normal. There is no distension.  ?   Palpations: Abdomen is soft. There is no mass.  ?Musculoskeletal:  ?   Cervical back: Neck supple.  ?Neurological:  ?   Mental Status: She is alert and oriented to person, place, and time.  ?Skin: ?   General: Skin is warm and dry.  ?Psychiatric:     ?   Behavior: Behavior normal.     ?   Thought Content: Thought content normal.     ?   Judgment: Judgment normal.  ?Vitals reviewed.  ? ? ?Assessment: 36 y.o. [id number] presenting for 6 week postpartum visit ? ?Plan: ?Problem List Items Addressed This Visit   ?None ? ? ?1) Contraception-  condoms ? ?2)  Pap: 11/2018- will need to update next visit ? ?3) Patient underwent screening for postpartum depression with no concerns noted.- Follow up  ?  ? ?Adrian Prows MD, FACOG ?[facility] ?01/31/2022 ?1:33 PM ? ?

## 2022-03-01 ENCOUNTER — Encounter: Payer: Self-pay | Admitting: Obstetrics and Gynecology

## 2022-03-01 ENCOUNTER — Encounter: Payer: Self-pay | Admitting: Family

## 2022-03-01 ENCOUNTER — Ambulatory Visit (INDEPENDENT_AMBULATORY_CARE_PROVIDER_SITE_OTHER): Payer: No Typology Code available for payment source | Admitting: Obstetrics and Gynecology

## 2022-03-01 NOTE — Progress Notes (Signed)
? ? ?Post Partum Visit Note ? ?Mary Richards is a 36 y.o. G72P2012 female who presents for a postpartum visit. She is 6 weeks postpartum following a normal spontaneous vaginal delivery.  I have fully reviewed the prenatal and intrapartum course. The delivery was at 19 gestational weeks.  Anesthesia: epidural. Postpartum course has been uncomplicated. Baby is doing well. Baby is feeding by breast. Bleeding no bleeding. Bowel function is normal. Bladder function is normal. Patient is not sexually active. Contraception method is abstinence. Postpartum depression screening: negative. Patient admits to feeling overwhelmed. She denies suicidal ideations. She is taking zoloft and feels that it is helping her significantly ? ? ?The pregnancy intention screening data noted above was reviewed. Potential methods of contraception were discussed. The patient elected to proceed with No data recorded. ? ? Edinburgh Postnatal Depression Scale - 03/01/22 1330   ? ?  ? Edinburgh Postnatal Depression Scale:  In the Past 7 Days  ? I have been able to laugh and see the funny side of things. 0   ? I have looked forward with enjoyment to things. 1   ? I have blamed myself unnecessarily when things went wrong. 2   ? I have been anxious or worried for no good reason. 2   ? I have felt scared or panicky for no good reason. 2   ? Things have been getting on top of me. 0   ? I have been so unhappy that I have had difficulty sleeping. 0   ? I have felt sad or miserable. 0   ? I have been so unhappy that I have been crying. 1   ? The thought of harming myself has occurred to me. 0   ? Edinburgh Postnatal Depression Scale Total 8   ? ?  ?  ? ?  ? ? ?Health Maintenance Due  ?Topic Date Due  ? COVID-19 Vaccine (3 - Booster for Janssen series) 11/03/2020  ? PAP SMEAR-Modifier  12/12/2021  ? ? ?  ? ?Review of Systems ?Pertinent items noted in HPI and remainder of comprehensive ROS otherwise negative. ? ?Objective:  ?BP 122/72   Ht '5\' 6"'$   (1.676 m)   Wt 198 lb (89.8 kg)   Breastfeeding Yes   BMI 31.96 kg/m?   ? ?General:  alert, cooperative, and no distress  ? Breasts:  normal  ?Lungs: clear to auscultation bilaterally  ?Heart:  regular rate and rhythm  ?Abdomen: soft, non-tender; bowel sounds normal; no masses,  no organomegaly   ?Wound N/a  ?GU exam:  normal  ?     ?Assessment:  ? ? 1. Routine postpartum follow-up ? ? ? ?Normal postpartum exam.  ? ?Plan:  ? ?Essential components of care per ACOG recommendations: ? ?1.  Mood and well being: Patient with negative depression screening today. Reviewed local resources for support.  ?- Patient tobacco use? No.   ?- hx of drug use? No.   ? ?2. Infant care and feeding:  ?-Patient currently breastmilk feeding? Yes. Reviewed importance of draining breast regularly to support lactation.  ?-Social determinants of health (SDOH) reviewed in EPIC. No concerns ? ?3. Sexuality, contraception and birth spacing ?- Patient does not want a pregnancy in the next year.  Desired family size is 3 children.  ?- Reviewed reproductive life planning. Reviewed contraceptive methods based on pt preferences and effectiveness.  Patient desired Female Condom today.   ?- Discussed birth spacing of 18 months ? ?4. Sleep and fatigue ?-Encouraged family/partner/community  support of 4 hrs of uninterrupted sleep to help with mood and fatigue ? ?5. Physical Recovery  ?- Discussed patients delivery and complications. She describes her labor as good. ?- Patient had a Vaginal, no problems at delivery. Patient had a 2nd degree laceration. Perineal healing reviewed. Patient expressed understanding ?- Patient has urinary incontinence? No. ?- Patient is safe to resume physical and sexual activity ? ?6.  Health Maintenance ?- HM due items addressed Yes ?- Last pap smear  ?Diagnosis  ?Date Value Ref Range Status  ?12/12/2018   Final  ? NEGATIVE FOR INTRAEPITHELIAL LESIONS OR MALIGNANCY.  ? Pap smear not done at today's visit. Patient plans to  return for annual exam ?-Breast Cancer screening indicated? No.  ? ?7. Chronic Disease/Pregnancy Condition follow up: None ? ?- PCP follow up ? ?Mora Bellman, MD ?[facility] ? ?

## 2022-03-04 ENCOUNTER — Telehealth: Payer: Self-pay

## 2022-03-04 NOTE — Telephone Encounter (Signed)
Pt states that [employer] is faxing over more paperwork. will look out for it and complete when I get it ?

## 2022-03-04 NOTE — Telephone Encounter (Signed)
Pt needs a call, reporting missing Disability paperwork that was faxed today.  ?

## 2022-03-08 ENCOUNTER — Encounter: Payer: Self-pay | Admitting: Licensed Practical Nurse

## 2022-04-18 ENCOUNTER — Ambulatory Visit: Payer: No Typology Code available for payment source | Admitting: Licensed Practical Nurse

## 2022-04-18 ENCOUNTER — Telehealth: Payer: Self-pay

## 2022-04-18 NOTE — Telephone Encounter (Signed)
Received a message for the after hours nurse line. Pt reports breast are sore and areola is red and very hard, no fever. Called pt to see if still having symptoms or have they improved, No answer, Left a voice message to call back if needing appointment or still having issues. Sheria Rosello cma

## 2022-04-24 DIAGNOSIS — K589 Irritable bowel syndrome without diarrhea: Secondary | ICD-10-CM | POA: Insufficient documentation

## 2022-04-24 DIAGNOSIS — L249 Irritant contact dermatitis, unspecified cause: Secondary | ICD-10-CM | POA: Insufficient documentation

## 2022-04-24 DIAGNOSIS — Z87442 Personal history of urinary calculi: Secondary | ICD-10-CM | POA: Insufficient documentation

## 2022-05-06 ENCOUNTER — Telehealth: Payer: Self-pay

## 2022-05-22 ENCOUNTER — Telehealth (HOSPITAL_COMMUNITY): Payer: Self-pay | Admitting: Lactation Services

## 2022-05-22 NOTE — Telephone Encounter (Signed)
Mary Richards called asking for advice on what OTC medications are safe for breastfeeding.  Mom has a sore throat.  Information given from [employer].

## 2022-05-23 DIAGNOSIS — J069 Acute upper respiratory infection, unspecified: Secondary | ICD-10-CM | POA: Insufficient documentation

## 2022-06-17 ENCOUNTER — Other Ambulatory Visit: Payer: Self-pay

## 2022-06-17 ENCOUNTER — Other Ambulatory Visit: Payer: Self-pay | Admitting: Licensed Practical Nurse

## 2022-06-17 DIAGNOSIS — F320 Major depressive disorder, single episode, mild: Secondary | ICD-10-CM

## 2022-06-17 DIAGNOSIS — Z8659 Personal history of other mental and behavioral disorders: Secondary | ICD-10-CM

## 2022-06-17 MED ORDER — SERTRALINE HCL 50 MG PO TABS
50.0000 mg | ORAL_TABLET | Freq: Every day | ORAL | 3 refills | Status: DC
  Filled 2022-06-17: qty 30, 30d supply, fill #0

## 2022-06-17 NOTE — Progress Notes (Signed)
Requesting renewal but feels she needs to increase her dose as the infant is not sleeping through the night so she is struggling a little.  Reports she has been only taking half a tablet daily.  States her prescription was for '50mg'$  daily.  Will start taking '50mg'$  daily Script sent Roberto Scales, [place], [facility]  06/17/22  4:48 PM

## 2022-06-28 ENCOUNTER — Other Ambulatory Visit: Payer: Self-pay

## 2022-06-28 MED ORDER — CHOLESTYRAMINE 4 G PO PACK
4.0000 g | PACK | Freq: Two times a day (BID) | ORAL | 11 refills | Status: AC
  Filled 2022-06-28: qty 60, 30d supply, fill #0

## 2022-06-29 ENCOUNTER — Other Ambulatory Visit: Payer: Self-pay

## 2022-07-05 ENCOUNTER — Telehealth: Payer: Self-pay

## 2022-07-05 NOTE — Telephone Encounter (Signed)
Notified Patient of Recommendations per Roberto Scales, CNM: She should express enough to be comfortable without fully emptying her breasts. There is nothing that can be prescribed.  This herbal tincture can help  [email or web address]   Information sent via my chart as well.

## 2022-07-05 NOTE — Telephone Encounter (Signed)
[employer] VOICEMAIL: Patient reports she is finished nursing. She is getting really engorged. She's been taking Sudafed, using cabbage leaves and taking Ibuprofen. She's in a lot of pain still. Still feels like she is producing because she is leaking. This is day three. Inquiring if she needs prescription or if there is something else she needs to do. [phone]

## 2022-07-05 NOTE — Telephone Encounter (Signed)
Spoke with patient. She denies, Fever/Chills, fatigue, redness/streaks, white bumps. She has been using ice packs. She is wearing a sports bra. She has done mild expression for relief of pressure/pain.

## 2022-07-28 DIAGNOSIS — R300 Dysuria: Secondary | ICD-10-CM | POA: Insufficient documentation

## 2022-08-09 ENCOUNTER — Encounter: Payer: Self-pay | Admitting: Licensed Practical Nurse

## 2022-08-09 ENCOUNTER — Ambulatory Visit (INDEPENDENT_AMBULATORY_CARE_PROVIDER_SITE_OTHER): Payer: No Typology Code available for payment source | Admitting: Licensed Practical Nurse

## 2022-08-09 ENCOUNTER — Other Ambulatory Visit (HOSPITAL_COMMUNITY)
Admission: RE | Admit: 2022-08-09 | Discharge: 2022-08-09 | Disposition: A | Discharge status: Home / Self Care | Payer: No Typology Code available for payment source | Source: Ambulatory Visit | Attending: Licensed Practical Nurse | Admitting: Licensed Practical Nurse

## 2022-08-09 VITALS — BP 122/74 | Ht 67.0 in | Wt 192.0 lb

## 2022-08-09 DIAGNOSIS — Z131 Encounter for screening for diabetes mellitus: Secondary | ICD-10-CM | POA: Diagnosis not present

## 2022-08-09 DIAGNOSIS — Z01419 Encounter for gynecological examination (general) (routine) without abnormal findings: Secondary | ICD-10-CM

## 2022-08-09 DIAGNOSIS — Z13220 Encounter for screening for lipoid disorders: Secondary | ICD-10-CM

## 2022-08-09 DIAGNOSIS — Z124 Encounter for screening for malignant neoplasm of cervix: Secondary | ICD-10-CM | POA: Diagnosis present

## 2022-08-09 NOTE — Progress Notes (Signed)
Gynecology Annual Exam   PCP: Burnard Hawthorne, FNP  Chief Complaint:  Chief Complaint  Patient presents with   Annual Exam    History of Present Illness: Patient is a 36 y.o. [id number] presents for annual exam. The patient has no complaints today.   LMP: No LMP recorded. Recently stopped breastfeeding, has not had a cycle yet, has had signs of ovulation 3 days ago.    The patient is sexually active with 1 female partner. She currently uses condoms for contraception. She admits to dyspareunia (pain at the site of her laceration with friction, resolves with use of lubricant).  The patient does perform self breast exams.  There is no notable family history of breast or ovarian cancer in her family.  The patient wears seatbelts: yes.   The patient has regular exercise: yes.  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  Was on Zoloft while her youngest was not sleeping well,  She weaned herself off and feels fine.   Review of Systems: Review of Systems  Constitutional:  Positive for malaise/fatigue.  HENT: Negative.    Eyes: Negative.   Respiratory: Negative.    Cardiovascular: Negative.   Gastrointestinal:  Positive for diarrhea.  Skin: Negative.   Neurological:  Positive for headaches.       Has been treated with Topamax for Migraines, feels HA are hormonal related.   Endo/Heme/Allergies:  Positive for environmental allergies.  Psychiatric/Behavioral:  The patient is nervous/anxious.     Past Medical History:  Patient Active Problem List   Diagnosis Date Noted   Encounter for elective induction of labor 01/15/2022   Decreased fetal movement 12/02/2021   Supervision of other normal pregnancy, antepartum  05/28/2021     Nursing Staff Provider  Office Location  [facility] Dating   6 wk [place]  Language  English Anatomy US  complete  Flu Vaccine  08/24/21 Genetic Screen  NIPS: declined  TDaP vaccine   11/11/21 Hgb A1C or  GTT Early : hgba1c 5.1 Third trimester : 102  Covid Vaccinated   LAB RESULTS   Rhogam  Not needed Blood Type A/Positive/-- (07/15 0921)   Feeding Plan Breast Antibody Negative (07/15 0921)  Contraception condoms Rubella 3.78 (07/15 7846)  Circumcision yes RPR Non Reactive (12/12 1051)   Pediatrician  [facility] HBsAg Negative (07/15 0921)   Support Person Husband- Chayne HIV Non Reactive (12/12 1051)  Prenatal Classes Discussed Varicella Immune    GBS Negative/-- (02/09 1037)   BTL Consent     VBAC Consent  Pap 2020 NIL    Hgb Electro      CF      SMA        Hx of PPD: Script for zoloft given                     Needs 2 wk PP visit       Asthma during pregnancy 05/28/2021   Obesity affecting pregnancy 05/28/2021   Encounter for examination of blood pressure with abnormal findings 01/11/2021   Bilateral impacted cerumen 06/05/2020   GAD (generalized anxiety disorder) 07/08/2019   Chronic low back pain 07/08/2019  Past Surgical History:  Past Surgical History:  Procedure Laterality Date   CHOLECYSTECTOMY     COLONOSCOPY WITH PROPOFOL N/A 02/19/2021   Procedure: COLONOSCOPY WITH PROPOFOL;  Surgeon: Jonathon Bellows, MD;  Location: [facility] ENDOSCOPY;  Service: Gastroenterology;  Laterality: N/A;   DIAGNOSTIC LAPAROSCOPY WITH REMOVAL OF ECTOPIC PREGNANCY Left 01/23/2021   Procedure: DIAGNOSTIC LAPAROSCOPY WITH REMOVAL OF ECTOPIC PREGNANCY;  Surgeon: Will Bonnet, MD;  Location: [facility] ORS;  Service: Gynecology;  Laterality: Left;   PILONIDAL CYST EXCISION     TONSILECTOMY, ADENOIDECTOMY, BILATERAL MYRINGOTOMY AND TUBES     TYMPANOPLASTY Right 05/10/2019   Procedure: TYMPANOPLASTY;  Surgeon: Beverly Gust, MD;  Location: [facility];  Service: ENT;   Laterality: Right;    Gynecologic History:  No LMP recorded. Contraception: condoms Last Pap: Results were: no abnormalities   Obstetric History: [id number]  Family History:  Family History  Problem Relation Age of Onset   Brain cancer Father 20   Valvular heart disease Sister    Brain cancer Paternal Grandmother 4   COPD Maternal Grandfather    Irritable bowel syndrome Mother    Thyroid cancer Neg Hx    Colon cancer Neg Hx     Social History:  Social History   Socioeconomic History   Marital status: Married    Spouse name: Administrator   Number of children: Not on file   Years of education: Not on file   Highest education level: Not on file  Occupational History   Not on file  Tobacco Use   Smoking status: Never   Smokeless tobacco: Never  Vaping Use   Vaping Use: Never used  Substance and Sexual Activity   Alcohol use: Yes    Alcohol/week: 2.0 standard drinks of alcohol    Types: 2 Standard drinks or equivalent per week    Comment: Social    Drug use: Never   Sexual activity: Yes    Birth control/protection: Condom    Comment: Unknown  Other Topics Concern   Not on file  Social History Narrative   Works as Designer, multimedia in ICU   2 children   Social Determinants of Radio broadcast assistant Strain: Not on file  Food Insecurity: Not on file  Transportation Needs: Not on file  Physical Activity: Not on file  Stress: Not on file  Social Connections: Not on file  Intimate Partner Violence: Not on file    Allergies:  Allergies  Allergen Reactions   Azithromycin Hives, Shortness Of Breath and Swelling    Throat swelling   Sulfa Antibiotics Hives   Ciprofloxacin Swelling   Moxifloxacin Hcl Hives   Bactrim [Sulfamethoxazole-Trimethoprim] Rash   Wound Dressing Adhesive Rash    Dermabond    Medications: Prior to Admission medications   Medication Sig Start Date End Date Taking? Authorizing Provider  acetaminophen (TYLENOL) 500 MG tablet Take 2 tablets  (1,000 mg total) by mouth every 6 (six) hours. 01/16/22  Yes Whitfield Dulay, Nunzio Cobbs, CNM  azelastine (ASTELIN) 0.1 % nasal spray Place 1 spray into both nostrils 2 (two) times daily. Use in each nostril as directed 12/14/21  Yes Arnett, Yvetta Coder, FNP  cetirizine (ZYRTEC) 10 MG tablet Take 10 mg by mouth daily.   Yes [provider]  cholestyramine (QUESTRAN) 4 g packet Take 1 packet (4 g total) by mouth 2 (two) times daily. 06/28/22  Yes Lucilla Lame, MD  fluticasone (FLONASE) 50 MCG/ACT nasal spray Place into both nostrils daily.   Yes [provider]  ibuprofen (ADVIL) 600 MG tablet Take 1 tablet (600 mg total) by mouth every 6 (six) hours. 01/16/22  Yes Hjalmar Ballengee, Nunzio Cobbs, CNM  Prenatal Vit-Fe Fumarate-FA (PRENATAL VITAMIN) 27-0.8 MG TABS Take 1 tablet by mouth daily.   Yes [provider]  albuterol (VENTOLIN HFA) 108 (90 Base) MCG/ACT inhaler INHALE 2 PUFFS INTO THE LUNGS EVERY 6 HOURS AS NEEDED FOR WHEEZING OR SHORTNESS OF BREATH. 01/12/21 01/12/22  Burnard Hawthorne, FNP    Physical Exam Vitals: Blood pressure 122/74, height '5\' 7"'$  (1.702 m), weight 192 lb (87.1 kg), currently breastfeeding.  General: NAD HEENT: normocephalic, anicteric Thyroid: no enlargement, no palpable nodules Pulmonary: No increased work of breathing, CTAB Cardiovascular: RRR, distal pulses 2+ Breast: Breast symmetrical, no tenderness, no palpable nodules or masses, no skin or nipple retraction present, no nipple discharge.  No axillary or supraclavicular lymphadenopathy. Abdomen: NABS, soft, non-tender, non-distended.  Umbilicus without lesions.  No hepatomegaly, splenomegaly or masses palpable. No evidence of hernia  Genitourinary:  External: Normal external female genitalia.  Normal urethral meatus, normal Bartholin's and Skene's glands.    Vagina: Normal vaginal mucosa, no evidence of prolapse.    Cervix: Grossly normal in appearance, no bleeding  Uterus: Non-enlarged, mobile, normal  contour.  No CMT  Adnexa: ovaries non-enlarged, no adnexal masses  Rectal: deferred  Lymphatic: no evidence of inguinal lymphadenopathy Extremities: no edema, erythema, or tenderness Neurologic: Grossly intact Psychiatric: mood appropriate, affect full   Assessment: 36 y.o. [id number] routine annual exam  Plan: Problem List Items Addressed This Visit   None   2) STI screening  wasoffered and declined  2)  ASCCP guidelines and rational discussed.  Patient opts for every 3 years screening interval  3) Contraception - the patient is currently using  condoms.  She is happy with her current form of contraception and plans to continue  4) Routine healthcare maintenance including cholesterol, diabetes screening discussed Ordered today  5) No follow-ups on file.  Roberto Scales, [facility], [facility] 08/09/2022, 8:31 AM

## 2022-08-10 ENCOUNTER — Encounter: Payer: Self-pay | Admitting: Family

## 2022-08-10 LAB — CBC
Hematocrit: 44 % (ref 34.0–46.6)
Hemoglobin: 14.7 g/dL (ref 11.1–15.9)
MCH: 30.4 pg (ref 26.6–33.0)
MCHC: 33.4 g/dL (ref 31.5–35.7)
MCV: 91 fL (ref 79–97)
Platelets: 324 10*3/uL (ref 150–450)
RBC: 4.83 x10E6/uL (ref 3.77–5.28)
RDW: 11.8 % (ref 11.7–15.4)
WBC: 8 10*3/uL (ref 3.4–10.8)

## 2022-08-10 LAB — TSH+FREE T4
Free T4: 1.14 ng/dL (ref 0.82–1.77)
TSH: 2.1 u[IU]/mL (ref 0.450–4.500)

## 2022-08-10 LAB — LIPID PANEL
Chol/HDL Ratio: 3 ratio (ref 0.0–4.4)
Cholesterol, Total: 172 mg/dL (ref 100–199)
HDL: 58 mg/dL (ref >39)
LDL Chol Calc (NIH): 96 mg/dL (ref 0–99)
Triglycerides: 99 mg/dL (ref 0–149)
VLDL Cholesterol Cal: 18 mg/dL (ref 5–40)

## 2022-08-10 LAB — HEMOGLOBIN A1C
Est. average glucose Bld gHb Est-mCnc: 105 mg/dL
Hgb A1c MFr Bld: 5.3 % (ref 4.8–5.6)

## 2022-08-15 LAB — CYTOLOGY - PAP
Comment: NEGATIVE
Diagnosis: UNDETERMINED — AB
High risk HPV: NEGATIVE

## 2022-08-29 ENCOUNTER — Ambulatory Visit: Payer: No Typology Code available for payment source | Admitting: Family

## 2022-08-31 ENCOUNTER — Emergency Department: Payer: No Typology Code available for payment source

## 2022-08-31 ENCOUNTER — Emergency Department
Admission: EM | Admit: 2022-08-31 | Discharge: 2022-08-31 | Disposition: A | Discharge status: Home / Self Care | Payer: No Typology Code available for payment source | Attending: Emergency Medicine | Admitting: Emergency Medicine

## 2022-08-31 ENCOUNTER — Ambulatory Visit
Admission: EM | Admit: 2022-08-31 | Discharge: 2022-08-31 | Disposition: A | Discharge status: Home / Self Care | Payer: No Typology Code available for payment source

## 2022-08-31 ENCOUNTER — Other Ambulatory Visit: Payer: Self-pay

## 2022-08-31 DIAGNOSIS — G44201 Tension-type headache, unspecified, intractable: Secondary | ICD-10-CM | POA: Diagnosis not present

## 2022-08-31 DIAGNOSIS — R112 Nausea with vomiting, unspecified: Secondary | ICD-10-CM | POA: Insufficient documentation

## 2022-08-31 DIAGNOSIS — Z20822 Contact with and (suspected) exposure to covid-19: Secondary | ICD-10-CM | POA: Diagnosis not present

## 2022-08-31 DIAGNOSIS — R519 Headache, unspecified: Secondary | ICD-10-CM | POA: Diagnosis present

## 2022-08-31 DIAGNOSIS — H53149 Visual discomfort, unspecified: Secondary | ICD-10-CM | POA: Diagnosis not present

## 2022-08-31 DIAGNOSIS — J45909 Unspecified asthma, uncomplicated: Secondary | ICD-10-CM | POA: Insufficient documentation

## 2022-08-31 LAB — BASIC METABOLIC PANEL WITH GFR
Anion gap: 6 (ref 5–15)
BUN: 10 mg/dL (ref 6–20)
CO2: 24 mmol/L (ref 22–32)
Calcium: 9.5 mg/dL (ref 8.9–10.3)
Chloride: 109 mmol/L (ref 98–111)
Creatinine, Ser: 0.53 mg/dL (ref 0.44–1.00)
GFR, Estimated: >60 mL/min
Glucose, Bld: 159 mg/dL — ABNORMAL HIGH (ref 70–99)
Potassium: 4 mmol/L (ref 3.5–5.1)
Sodium: 139 mmol/L (ref 135–145)

## 2022-08-31 LAB — CBC WITH DIFFERENTIAL/PLATELET
Abs Immature Granulocytes: 0.09 10*3/uL — ABNORMAL HIGH (ref 0.00–0.07)
Basophils Absolute: 0 10*3/uL (ref 0.0–0.1)
Basophils Relative: 0 %
Eosinophils Absolute: 0 10*3/uL (ref 0.0–0.5)
Eosinophils Relative: 0 %
HCT: 48 % — ABNORMAL HIGH (ref 36.0–46.0)
Hemoglobin: 15.1 g/dL — ABNORMAL HIGH (ref 12.0–15.0)
Immature Granulocytes: 1 %
Lymphocytes Relative: 13 %
Lymphs Abs: 1.4 10*3/uL (ref 0.7–4.0)
MCH: 29.1 pg (ref 26.0–34.0)
MCHC: 31.5 g/dL (ref 30.0–36.0)
MCV: 92.5 fL (ref 80.0–100.0)
Monocytes Absolute: 0.2 10*3/uL (ref 0.1–1.0)
Monocytes Relative: 2 %
Neutro Abs: 9 10*3/uL — ABNORMAL HIGH (ref 1.7–7.7)
Neutrophils Relative %: 84 %
Platelets: 348 10*3/uL (ref 150–400)
RBC: 5.19 MIL/uL — ABNORMAL HIGH (ref 3.87–5.11)
RDW: 12.4 % (ref 11.5–15.5)
WBC: 10.8 10*3/uL — ABNORMAL HIGH (ref 4.0–10.5)
nRBC: 0 % (ref 0.0–0.2)

## 2022-08-31 LAB — RESP PANEL BY RT-PCR (FLU A&B, COVID) ARPGX2
Influenza A by PCR: NEGATIVE
Influenza B by PCR: NEGATIVE
SARS Coronavirus 2 by RT PCR: NEGATIVE

## 2022-08-31 MED ORDER — METOCLOPRAMIDE HCL 5 MG/ML IJ SOLN
10.0000 mg | Freq: Once | INTRAMUSCULAR | Status: AC
  Administered 2022-08-31: 10 mg via INTRAVENOUS
  Filled 2022-08-31: qty 2

## 2022-08-31 MED ORDER — METHOCARBAMOL 500 MG PO TABS
500.0000 mg | ORAL_TABLET | Freq: Two times a day (BID) | ORAL | 0 refills | Status: AC
  Filled 2022-08-31: qty 10, 5d supply, fill #0

## 2022-08-31 MED ORDER — SODIUM CHLORIDE 0.9 % IV BOLUS
1000.0000 mL | Freq: Once | INTRAVENOUS | Status: AC
  Administered 2022-08-31: 1000 mL via INTRAVENOUS

## 2022-08-31 MED ORDER — DIPHENHYDRAMINE HCL 50 MG/ML IJ SOLN
25.0000 mg | Freq: Once | INTRAMUSCULAR | Status: AC
  Administered 2022-08-31: 25 mg via INTRAVENOUS
  Filled 2022-08-31: qty 1

## 2022-08-31 MED ORDER — DEXAMETHASONE SODIUM PHOSPHATE 10 MG/ML IJ SOLN
10.0000 mg | Freq: Once | INTRAMUSCULAR | Status: AC
  Administered 2022-08-31: 10 mg via INTRAMUSCULAR

## 2022-08-31 MED ORDER — METOCLOPRAMIDE HCL 10 MG PO TABS: 10.0000 mg | ORAL_TABLET | Freq: Three times a day (TID) | ORAL | 0 refills | Status: DC | PRN

## 2022-08-31 MED ORDER — METHYLPREDNISOLONE 4 MG PO TBPK
ORAL_TABLET | ORAL | 0 refills | Status: DC
  Filled 2022-08-31: qty 21, 6d supply, fill #0

## 2022-08-31 MED ORDER — KETOROLAC TROMETHAMINE 30 MG/ML IJ SOLN
30.0000 mg | Freq: Once | INTRAMUSCULAR | Status: AC
  Administered 2022-08-31: 30 mg via INTRAVENOUS
  Filled 2022-08-31: qty 1

## 2022-08-31 NOTE — ED Provider Notes (Signed)
[facility] Provider Note    Event Date/Time   First MD Initiated Contact with Patient 08/31/22 2040     (approximate)   History   Headache (Pt. To ED via POV from home with headache since 1500 yesterday. Pt. States she has hx of migraines, but this is much worse, and neck is stiff. UC sent pt. To ED to rule out meningitis.)   HPI  Mary Richards is a 36 y.o. female with a prior history of migraines previously on Topamax,, asthma, and degenerative disc disease who presents with a headache.  The patient states that 3 days ago she and her son both had fevers to 101 and a headache.  She states that her symptoms resolved the next day, but then yesterday she developed a headache again and it is persisted since that time.  The headache is generalized and radiates down her neck and back.  It is described as throbbing especially when she stands up.  She reports associated photophobia, nausea, and multiple episodes of vomiting today.  She states that this is worse than any of her prior migraines although she has not had a migraine in several years and is no longer on medication for them.  She has not had any fever in over 3 days.  She has no chills or body aches.  I reviewed the past medical records.  The patient was seen in urgent care earlier today, treated with dexamethasone, and prescribed methocarbamol and methylprednisolone.  Meningitis was not suspected at that time.  She states that over the rest of the day the headache returned and worsened and she started to have the vomiting.  She had been told to come to the ED if the headache worsened.     Physical Exam   Triage Vital Signs: ED Triage Vitals  Enc Vitals Group     BP 08/31/22 2110 125/60     Pulse Rate 08/31/22 2110 89     Resp 08/31/22 2110 15     Temp --      Temp Source 08/31/22 2110 Oral     SpO2 08/31/22 2110 100 %     Weight 08/31/22 1655 190 lb (86.2 kg)     Height 08/31/22 1655 '5\' 6"'$   (1.676 m)     Head Circumference --      Peak Flow --      Pain Score 08/31/22 1654 8     Pain Loc --      Pain Edu? --      Excl. in [place]? --     Most recent vital signs: Vitals:   08/31/22 2110 08/31/22 2145  BP: 125/60 133/73  Pulse: 89 90  Resp: 15 12  Temp: 98.2 F (36.8 C)   SpO2: 100% 98%     General: Alert and oriented, uncomfortable appearing. CV:  Good peripheral perfusion.  Resp:  Normal effort.  Abd:  No distention.  Other:  EOMI.  PERRLA.  Mild photophobia.  No facial droop.  Cranial nerves III through XII intact.  No ataxia.  No pronator drift.  Motor intact in all extremities.  Neck supple, full range of motion.  Negative Kernig and Brudzinski signs.   ED Results / Procedures / Treatments   Labs (all labs ordered are listed, but only abnormal results are displayed) Labs Reviewed  BASIC METABOLIC PANEL - Abnormal; Notable for the following components:      Result Value   Glucose, Bld 159 (*)  All other components within normal limits  CBC WITH DIFFERENTIAL/PLATELET - Abnormal; Notable for the following components:   WBC 10.8 (*)    RBC 5.19 (*)    Hemoglobin 15.1 (*)    HCT 48.0 (*)    Neutro Abs 9.0 (*)    Abs Immature Granulocytes 0.09 (*)    All other components within normal limits  RESP PANEL BY RT-PCR (FLU A&B, COVID) [id number]  URINALYSIS, ROUTINE W REFLEX MICROSCOPIC  POC URINE PREG, ED     EKG  ED ECG REPORT I, Arta Silence, the attending physician, personally viewed and interpreted this ECG.  Date: 08/31/2022 EKG Time: 2239 Rate: 90 Rhythm: normal sinus rhythm QRS Axis: normal Intervals: normal ST/T Wave abnormalities: normal Narrative Interpretation: no evidence of acute ischemia    RADIOLOGY  CT head: I independently viewed and interpreted the images; there is no ICH or evidence of mass effect or midline shift  PROCEDURES:  Critical Care performed: No  Procedures   MEDICATIONS ORDERED IN ED: Medications   sodium chloride 0.9 % bolus 1,000 mL (0 mLs Intravenous Stopped 08/31/22 2249)  metoCLOPramide (REGLAN) injection 10 mg (10 mg Intravenous Given 08/31/22 2134)  diphenhydrAMINE (BENADRYL) injection 25 mg (25 mg Intravenous Given 08/31/22 2134)  ketorolac (TORADOL) 30 MG/ML injection 30 mg (30 mg Intravenous Given 08/31/22 2131)     IMPRESSION / MDM / ASSESSMENT AND PLAN / ED COURSE  I reviewed the triage vital signs and the nursing notes.  36 year old female with PMH as noted above presents with persistent headache associated with nausea and vomiting over the course of the day today.  Vital signs are normal.  Neurologic exam is normal.  There are no meningeal signs on exam.  Differential diagnosis includes, but is not limited to, migraine headache, tension headache, cluster there is no clinical evidence for ICH or meningitis.  CT head was obtained from triage which shows no acute abnormalities.  Labs are also unremarkable with a normal chemistry and negative COVID.  WBC count is 10.8 although CBC appears somewhat hemoconcentrated so there is no significant leukocytosis.  On exam the patient is afebrile and has no meningeal signs.  The patient was told to come to the ED if her headache worsened with the possibility of ruling out meningitis if indicated.  However, at this time given the lack of fever, other abnormal vital signs, leukocytosis, or any meningeal signs on exam, there is no clinical evidence for meningitis or other acute infection.  I explained the findings of the work-up so far with the patient and the lack of evidence for meningitis.  I did give her the option for lumbar puncture if she preferred, however she stated she would prefer expectant management with symptomatic treatment and then reassessment for possible additional tests if needed.  I did also consider whether the patient may benefit from MRI although given the negative CT and normal neurologic exam there is no indication at  this time.  We will consider if the headache is intractable.  I have ordered IV fluids, Reglan, Benadryl, and Toradol.  Patient's presentation is most consistent with acute presentation with potential threat to life or bodily function.  The patient is on the cardiac monitor to evaluate for evidence of arrhythmia and/or significant heart rate changes.  ----------------------------------------- 11:07 PM on 08/31/2022 -----------------------------------------  Shortly after the IV was placed the patient had a near syncopal episode although did not appear to fully lose consciousness.  However she started vomiting at this time and  there was brief concern that she may have aspirated.  Pulse oximetry remained at 100% and after coughing several times the patient stated she had no shortness of breath and was breathing comfortably.  Lungs were clear to auscultation.  EKG was obtained and was normal.  I reassessed the patient again after short time and she appeared comfortable.  At this time, the patient reports that the headache has significantly improved and she is feeling much better.  She appears much more comfortable on reassessment.  At this time there is no indication for LP or further emergent ED work-up.  The patient feels comfortable and would like to go home.   I will prescribe p.o. Reglan and the patient will continue to take the steroid prescribed in urgent care today.  I gave her strict return precautions and she expressed understanding.  I have also given referral to neurology so that she can reestablish care.   FINAL CLINICAL IMPRESSION(S) / ED DIAGNOSES   Final diagnoses:  Acute nonintractable headache, unspecified headache type     Rx / DC Orders   ED Discharge Orders          Ordered    metoCLOPramide (REGLAN) 10 MG tablet  Every 8 hours PRN        08/31/22 2305             Note:  This document was prepared using Dragon voice recognition software and may include  unintentional dictation errors.    Arta Silence, MD 08/31/22 [phone]

## 2022-08-31 NOTE — ED Triage Notes (Signed)
Pt. To ED via POV from home with headache since 1500 yesterday. Pt. States she has hx of migraines, but this is much worse, and neck is stiff. UC sent pt. To ED to rule out meningitis.

## 2022-08-31 NOTE — Discharge Instructions (Addendum)
You may continue to take the prednisone that was prescribed at urgent care.  Take the Reglan prescribed today as needed for nausea and/or headache.  You may also take ibuprofen although you should not do it on an empty stomach.  Follow-up with neurology; a referral has been provided.  Return to the ER immediately for new, worsening, or persistent severe headache, fever, neck stiffness, vomiting, recurrent episodes of lightheadedness or passing out, vision changes, difficulty speaking, strokelike symptoms, weakness or numbness, seizure, or any other new or worsening symptoms that concern you.

## 2022-08-31 NOTE — ED Provider Triage Note (Signed)
Emergency Medicine Provider Triage Evaluation Note  Mary Richards , a 36 y.o. female  was evaluated in triage.  Pt complains of headache, neck pain, neck stiffness.  Son has [place].  States never had a headache like this.  Sent by urgent care for rule out of meningitis.  Review of Systems  Positive:  Negative:   Physical Exam  Ht '5\' 6"'$  (1.676 m)   Wt 86.2 kg   LMP 08/09/2022 (Exact Date)   BMI 30.67 kg/m  Gen:   Awake, no distress   Resp:  Normal effort  MSK:   Moves extremities without difficulty  Other:  Pain is reproduced with forward flexion of the neck  Medical Decision Making  Medically screening exam initiated at 5:15 PM.  Appropriate orders placed.  Samanda Buske was informed that the remainder of the evaluation will be completed by another provider, this initial triage assessment does not replace that evaluation, and the importance of remaining in the ED until their evaluation is complete.  Labs, CT   Versie Starks, PA-C 08/31/22 1716

## 2022-08-31 NOTE — ED Notes (Signed)
This RN drawing up medications at bedside; patient begins to vomit w/ syncopal episode while vomiting, slumping over in bed.  Patient unresponsive to verbal/painful stimuli during this episode, appeared to be choking on emesis.  EDP, Siadecki called to bedside.  Suction set up at bedside, patient comes back to within a minute but does not recall what happened.  Vitals WDL following this episode.

## 2022-08-31 NOTE — ED Provider Notes (Addendum)
Roderic Palau    CSN: [id number] Arrival date & time: 08/31/22  4098      History   Chief Complaint Chief Complaint  Patient presents with   Neck Pain    HPI Mary Richards is a 36 y.o. female.    Neck Pain   Patient presents to UC with complaint of neck pain radiating to her shoulders and "down her back".  Symptoms starting yesterday with "intense headache".  She states she has been self treating with ibuprofen and Tylenol without any improvement in symptoms.  Chart indicates history of migraines treated with Topamax. Patient states last dose a few years ago. She is approximately 7 months postpartum and no longer breast-feeding.  She reports head pain is "all over" including frontal temporal and occipital.  Occipital pain results in sore neck radiating down her mid thoracic spine and both shoulders. Denies any known injury, history or precipitating event. No new stressors or activities.  Endorses elevated measured body temp last night of 99.23F.  Past Medical History:  Diagnosis Date   Asthma    Chronic migraine    Complication of anesthesia    DDD (degenerative disc disease), lumbosacral    Family history of adverse reaction to anesthesia    Mother and Father - PONV   PONV (postoperative nausea and vomiting)    Ruptured left tubal ectopic pregnancy causing hemoperitoneum 01/23/2021   Wears contact lenses     Patient Active Problem List   Diagnosis Date Noted   Dysuria 07/28/2022   Acute upper respiratory infection, unspecified 05/23/2022   History of urinary stone 04/24/2022   Irritable bowel syndrome without diarrhea 04/24/2022   Irritant contact dermatitis, unspecified cause 04/24/2022   Asthma during pregnancy 05/28/2021   Encounter for examination of blood pressure with abnormal findings 01/11/2021   Bilateral impacted cerumen 06/05/2020   GAD (generalized anxiety disorder) 07/08/2019   Chronic low back pain 07/08/2019    Past Surgical  History:  Procedure Laterality Date   CHOLECYSTECTOMY     COLONOSCOPY WITH PROPOFOL N/A 02/19/2021   Procedure: COLONOSCOPY WITH PROPOFOL;  Surgeon: Jonathon Bellows, MD;  Location: [facility] ENDOSCOPY;  Service: Gastroenterology;  Laterality: N/A;   DIAGNOSTIC LAPAROSCOPY WITH REMOVAL OF ECTOPIC PREGNANCY Left 01/23/2021   Procedure: DIAGNOSTIC LAPAROSCOPY WITH REMOVAL OF ECTOPIC PREGNANCY;  Surgeon: Will Bonnet, MD;  Location: [facility] ORS;  Service: Gynecology;  Laterality: Left;   PILONIDAL CYST EXCISION     TONSILECTOMY, ADENOIDECTOMY, BILATERAL MYRINGOTOMY AND TUBES     TYMPANOPLASTY Right 05/10/2019   Procedure: TYMPANOPLASTY;  Surgeon: Beverly Gust, MD;  Location: [place];  Service: ENT;  Laterality: Right;    OB History     Gravida  3   Para  2   Term  2   Preterm      AB  1   Living  2      SAB      IAB      Ectopic  1   Multiple  0   Live Births  2            Home Medications    Prior to Admission medications   Medication Sig Start Date End Date Taking? Authorizing Provider  acetaminophen (TYLENOL) 500 MG tablet Take 2 tablets (1,000 mg total) by mouth every 6 (six) hours. 01/16/22   Dominic, Nunzio Cobbs, CNM  albuterol (VENTOLIN HFA) 108 (90 Base) MCG/ACT inhaler INHALE 2 PUFFS INTO THE LUNGS EVERY 6 HOURS AS NEEDED FOR WHEEZING  OR SHORTNESS OF BREATH. 01/12/21 01/12/22  Burnard Hawthorne, FNP  amoxicillin (AMOXIL) 875 MG tablet Take 1 tablet by mouth every 12 (twelve) hours.    [provider]  azelastine (ASTELIN) 0.1 % nasal spray Place 1 spray into both nostrils 2 (two) times daily. Use in each nostril as directed 12/14/21   Burnard Hawthorne, FNP  buPROPion (WELLBUTRIN XL) 150 MG 24 hr tablet     [provider]  cetirizine (ZYRTEC) 10 MG tablet Take 10 mg by mouth daily.    [provider]  cholestyramine (QUESTRAN) 4 g packet Take 1 packet (4 g total) by mouth 2 (two) times daily. 06/28/22   Lucilla Lame, MD   fluconazole (DIFLUCAN) 150 MG tablet PLEASE SEE ATTACHED FOR DETAILED DIRECTIONS    [provider]  fluticasone (FLONASE) 50 MCG/ACT nasal spray Place into both nostrils daily.    [provider]  gabapentin (NEURONTIN) 100 MG capsule     [provider]  ibuprofen (ADVIL) 600 MG tablet Take 1 tablet (600 mg total) by mouth every 6 (six) hours. 01/16/22   Dominic, Nunzio Cobbs, CNM  ibuprofen (ADVIL) 600 MG tablet Take 1 tablet by mouth every 6 (six) hours.    [provider]  meclizine (ANTIVERT) 25 MG tablet     [provider]  meloxicam (MOBIC) 7.5 MG tablet     [provider]  nitrofurantoin, macrocrystal-monohydrate, (MACROBID) 100 MG capsule Take 100 mg by mouth 2 (two) times daily. 07/28/22   [provider]  predniSONE (DELTASONE) 10 MG tablet     [provider]  Prenatal Vit-Fe Fumarate-FA (PRENATAL VITAMIN) 27-0.8 MG TABS Take 1 tablet by mouth daily.    [provider]  promethazine (PHENERGAN) 25 MG tablet     [provider]  sertraline (ZOLOFT) 50 MG tablet     [provider]  topiramate (TOPAMAX) 25 MG tablet Take 1 tablet by mouth daily.    [provider]  traZODone (DESYREL) 50 MG tablet     [provider]  triamcinolone ointment (KENALOG) 0.5 % Apply topically 2 (two) times daily. 04/24/22   [provider]    Family History Family History  Problem Relation Age of Onset   Brain cancer Father 9   Valvular heart disease Sister    Brain cancer Paternal Grandmother 54   COPD Maternal Grandfather    Irritable bowel syndrome Mother    Thyroid cancer Neg Hx    Colon cancer Neg Hx     Social History Social History   Tobacco Use   Smoking status: Never   Smokeless tobacco: Never  Vaping Use   Vaping Use: Never used  Substance Use Topics   Alcohol use: Yes    Alcohol/week: 2.0 standard drinks of alcohol    Types: 2 Standard drinks or  equivalent per week    Comment: Social    Drug use: Never     Allergies   Azithromycin, Sulfa antibiotics, Ciprofloxacin, Moxifloxacin hcl, Bactrim [sulfamethoxazole-trimethoprim], and Wound dressing adhesive   Review of Systems Review of Systems  Musculoskeletal:  Positive for neck pain.     Physical Exam Triage Vital Signs ED Triage Vitals [08/31/22 0842]  Enc Vitals Group     BP 133/84     Pulse Rate 87     Resp 17     Temp 97.7 F (36.5 C)     Temp src      SpO2 98 %  Weight      Height      Head Circumference      Peak Flow      Pain Score 7     Pain Loc      Pain Edu?      Excl. in [place]?    No data found.  Updated Vital Signs BP 133/84   Pulse 87   Temp 97.7 F (36.5 C)   Resp 17   LMP 08/09/2022 (Exact Date)   SpO2 98%   Breastfeeding No   Visual Acuity Right Eye Distance:   Left Eye Distance:   Bilateral Distance:    Right Eye Near:   Left Eye Near:    Bilateral Near:     Physical Exam Vitals reviewed.  Constitutional:      Appearance: Normal appearance. She is ill-appearing.  Neck:     Meningeal: Brudzinski's sign and Kernig's sign absent.     Comments: Negative Brudzinski and Kernig. Musculoskeletal:     Cervical back: No rigidity. Pain with movement and muscular tenderness present. No spinous process tenderness. Decreased range of motion.  Skin:    General: Skin is warm and dry.  Neurological:     General: No focal deficit present.     Mental Status: She is alert and oriented to person, place, and time.  Psychiatric:        Mood and Affect: Mood normal.        Behavior: Behavior normal.      UC Treatments / Results  Labs (all labs ordered are listed, but only abnormal results are displayed) Labs Reviewed - No data to display  EKG   Radiology No results found.  Procedures Procedures (including critical care time)  Medications Ordered in UC Medications - No data to display  Initial Impression / Assessment and  Plan / UC Course  I have reviewed the triage vital signs and the nursing notes.  Pertinent labs & imaging results that were available during my care of the patient were reviewed by me and considered in my medical decision making (see chart for details).   Low likelihood of meningitis.  While she does have some pain with neck range of motion, negative Brudzinski and Kernig.  Will treat for tension headache with Decadron IM in the office, Medrol dose pack and Robaxin at home.  Discussed risk of meningitis with the patient and advised her to follow-up in the ED if her symptoms persist or acutely worsen.   Final Clinical Impressions(s) / UC Diagnoses   Final diagnoses:  None   Discharge Instructions   None    ED Prescriptions   None    PDMP not reviewed this encounter.   Rose Phi, [place] 08/31/22 [place], [place], [place] 08/31/22 [phone]

## 2022-08-31 NOTE — Discharge Instructions (Addendum)
Follow up your primary care provider as needed. Proceed to ED if your symptoms are worsening or not improving with treatment.

## 2022-08-31 NOTE — ED Triage Notes (Signed)
Pt. Presents to UC w/ c/o neck pain that radiates to her shoulders and down her back. Pt. States the pain started yesterday as an intense headache. Pt.has been treating herself w/ tylenol.

## 2022-09-02 ENCOUNTER — Telehealth: Payer: Self-pay

## 2022-09-02 ENCOUNTER — Ambulatory Visit (INDEPENDENT_AMBULATORY_CARE_PROVIDER_SITE_OTHER): Payer: No Typology Code available for payment source | Admitting: Internal Medicine

## 2022-09-02 ENCOUNTER — Encounter: Payer: Self-pay | Admitting: Internal Medicine

## 2022-09-02 DIAGNOSIS — G43011 Migraine without aura, intractable, with status migrainosus: Secondary | ICD-10-CM | POA: Diagnosis not present

## 2022-09-02 DIAGNOSIS — G43919 Migraine, unspecified, intractable, without status migrainosus: Secondary | ICD-10-CM | POA: Insufficient documentation

## 2022-09-02 MED ORDER — KETOROLAC TROMETHAMINE 60 MG/2ML IM SOLN
60.0000 mg | Freq: Once | INTRAMUSCULAR | Status: AC
  Administered 2022-09-02: 60 mg via INTRAMUSCULAR

## 2022-09-02 NOTE — Assessment & Plan Note (Signed)
Mild improvement with therapy Nothing to suggest meningitis or encephalitis from prior viral infection (but possible) Seems to have vestibular component  Will give toradol '60mg'$  IM She should go home and take the reglan Continue the steroid taper Has appt with neurologist for next week

## 2022-09-02 NOTE — Telephone Encounter (Signed)
NOTED

## 2022-09-02 NOTE — Addendum Note (Signed)
Addended by: Pilar Grammes on: 09/02/2022 12:55 PM   Modules accepted: Orders

## 2022-09-02 NOTE — Progress Notes (Signed)
Subjective:    Patient ID: Mary Richards, female    DOB: 28-Feb-1986, 36 y.o.   MRN: [id number]  HPI Here due to ongoing headache Was seen in ER yesterday---records reviewed (CT benign, presentation not felt to be from meningitis or any infection)  Some "viral something" starting a week ago Headache/fever--but then this went away (mostly gone 4 days ago) Then "normal headache" 3 days ago--some better with ibuprofen but then escalated later in the day Severe nausea Ibuprofen/tylenol didn't do anything 2 days ago---headache pain so bad with vomiting and blacking out--so went to ER then (after urgent care). At that time, oxycodone didn't do anything either Got decadron in urgent care Then toradol IV/oral reglan in ER Pain down to a 3/10--so went home  Then worsened yesterday Pain with any movement Sensitive to noise (sounds are different) and photophobia  No recent fever Pain shoots into head and over to face if she tilts head Feels like the pain goes down spine  Current Outpatient Medications on File Prior to Visit  Medication Sig Dispense Refill   acetaminophen (TYLENOL) 500 MG tablet Take 2 tablets (1,000 mg total) by mouth every 6 (six) hours. 30 tablet 0   azelastine (ASTELIN) 0.1 % nasal spray Place 1 spray into both nostrils 2 (two) times daily. Use in each nostril as directed 30 mL 3   cetirizine (ZYRTEC) 10 MG tablet Take 10 mg by mouth daily.     cholestyramine (QUESTRAN) 4 g packet Take 1 packet (4 g total) by mouth 2 (two) times daily. 60 each 11   fluticasone (FLONASE) 50 MCG/ACT nasal spray Place into both nostrils daily.     ibuprofen (ADVIL) 600 MG tablet Take 1 tablet (600 mg total) by mouth every 6 (six) hours. 30 tablet 0   methocarbamol (ROBAXIN) 500 MG tablet Take 1 tablet (500 mg total) by mouth 2 (two) times daily for 5 days. 10 tablet 0   methylPREDNISolone (MEDROL DOSEPAK) 4 MG TBPK tablet Take as directed by packaging. 21 tablet 0   albuterol  (VENTOLIN HFA) 108 (90 Base) MCG/ACT inhaler INHALE 2 PUFFS INTO THE LUNGS EVERY 6 HOURS AS NEEDED FOR WHEEZING OR SHORTNESS OF BREATH. 18 g 1   meclizine (ANTIVERT) 25 MG tablet  (Patient not taking: Reported on 09/02/2022)     Current Facility-Administered Medications on File Prior to Visit  Medication Dose Route Frequency Provider Last Rate Last Admin   bupivacaine (PF) (MARCAINE) 0.25 % injection   Epidural Anesthesia Intra-op Scarlett Presto, MD   8 mL at 01/15/22 0531   fentaNYL (SUBLIMAZE) injection   Epidural Anesthesia Intra-op Scarlett Presto, MD   50 mcg at 01/15/22 0546   fentaNYL 2 mcg/mL w/ bupivacaine 0.125% in NS 250 mL epidural infusion   Epidural Continuous PRN Scarlett Presto, MD   Stopped at 01/15/22 0612   lidocaine (PF) (XYLOCAINE) 1 % injection   Other Anesthesia Intra-op Scarlett Presto, MD   3 mL at 01/15/22 0551   lidocaine-EPINEPHrine 1.5 %-1:200000 injection   Peri-NEURAL Anesthesia Intra-op Scarlett Presto, MD   3 mL at 01/15/22 7106    Allergies  Allergen Reactions   Azithromycin Hives, Shortness Of Breath and Swelling    Throat swelling   Sulfa Antibiotics Hives   Ciprofloxacin Swelling   Moxifloxacin Hcl Hives   Bactrim [Sulfamethoxazole-Trimethoprim] Rash   Wound Dressing Adhesive Rash    Dermabond    Past Medical History:  Diagnosis Date   Asthma    Chronic migraine  Complication of anesthesia    DDD (degenerative disc disease), lumbosacral    Family history of adverse reaction to anesthesia    Mother and Father - PONV   PONV (postoperative nausea and vomiting)    Ruptured left tubal ectopic pregnancy causing hemoperitoneum 01/23/2021   Wears contact lenses     Past Surgical History:  Procedure Laterality Date   CHOLECYSTECTOMY     COLONOSCOPY WITH PROPOFOL N/A 02/19/2021   Procedure: COLONOSCOPY WITH PROPOFOL;  Surgeon: Jonathon Bellows, MD;  Location: [facility] ENDOSCOPY;  Service: Gastroenterology;  Laterality: N/A;   DIAGNOSTIC  LAPAROSCOPY WITH REMOVAL OF ECTOPIC PREGNANCY Left 01/23/2021   Procedure: DIAGNOSTIC LAPAROSCOPY WITH REMOVAL OF ECTOPIC PREGNANCY;  Surgeon: Will Bonnet, MD;  Location: [facility] ORS;  Service: Gynecology;  Laterality: Left;   PILONIDAL CYST EXCISION     TONSILECTOMY, ADENOIDECTOMY, BILATERAL MYRINGOTOMY AND TUBES     TYMPANOPLASTY Right 05/10/2019   Procedure: TYMPANOPLASTY;  Surgeon: Beverly Gust, MD;  Location: [place];  Service: ENT;  Laterality: Right;    Family History  Problem Relation Age of Onset   Brain cancer Father 61   Valvular heart disease Sister    Brain cancer Paternal Grandmother 25   COPD Maternal Grandfather    Irritable bowel syndrome Mother    Thyroid cancer Neg Hx    Colon cancer Neg Hx     Social History   Socioeconomic History   Marital status: Married    Spouse name: Administrator   Number of children: Not on file   Years of education: Not on file   Highest education level: Not on file  Occupational History   Not on file  Tobacco Use   Smoking status: Never   Smokeless tobacco: Never  Vaping Use   Vaping Use: Never used  Substance and Sexual Activity   Alcohol use: Yes    Alcohol/week: 2.0 standard drinks of alcohol    Types: 2 Standard drinks or equivalent per week    Comment: Social    Drug use: Never   Sexual activity: Yes    Birth control/protection: Condom    Comment: Unknown  Other Topics Concern   Not on file  Social History Narrative   Works as Designer, multimedia in ICU   2 children   Social Determinants of Radio broadcast assistant Strain: Not on file  Food Insecurity: Not on file  Transportation Needs: Not on file  Physical Activity: Not on file  Stress: Not on file  Social Connections: Not on file  Intimate Partner Violence: Not on file   Review of Systems Has missed work all this week Ate okay yesterday. Only water/granola bar today No known tick bites    Objective:   Physical Exam Constitutional:       Comments: Clearly uncomfortable--but not in distress  HENT:     Mouth/Throat:     Pharynx: No oropharyngeal exudate or posterior oropharyngeal erythema.  Eyes:     Extraocular Movements: Extraocular movements intact.     Comments: Slight horizontal nystagmus with right gaze  Neck:     Comments: Pain with passive ROM but no meningeal signs Neurological:     Mental Status: She is alert and oriented to person, place, and time.     Cranial Nerves: Cranial nerves 2-12 are intact.     Motor: No weakness, tremor or abnormal muscle tone.     Coordination: Romberg sign negative. Coordination normal.     Gait: Gait normal.  Psychiatric:  Mood and Affect: Mood normal.        Behavior: Behavior normal.            Assessment & Plan:

## 2022-09-02 NOTE — Telephone Encounter (Signed)
Patient called to state Access Nurse told her to call our office and see whoever is available in the next 24 hours.  Patient has been scheduled to see Dr. Viviana Simpler today.

## 2022-09-02 NOTE — Telephone Encounter (Signed)
Patient states she was in the ED on Wednesday night for migraines (sensitive to light and felt like she was going to pass out from the pain) with vomiting and neck pain.  Patient states she feels like it was starting again yesterday morning and it's worse this morning and this time it's affecting her hearing as well.  I transferred call to Access Nurse.

## 2022-09-04 ENCOUNTER — Telehealth: Payer: No Typology Code available for payment source | Admitting: Physician Assistant

## 2022-09-04 DIAGNOSIS — B3731 Acute candidiasis of vulva and vagina: Secondary | ICD-10-CM | POA: Diagnosis not present

## 2022-09-05 MED ORDER — FLUCONAZOLE 150 MG PO TABS: 150.0000 mg | ORAL_TABLET | Freq: Once | ORAL | 0 refills | Status: AC

## 2022-09-05 NOTE — Progress Notes (Signed)
I have spent 5 minutes in review of e-visit questionnaire, review and updating patient chart, medical decision making and response to patient.   Deziray Nabi Cody Yanet Balliet, PA-C    

## 2022-09-05 NOTE — Progress Notes (Signed)

## 2022-09-07 ENCOUNTER — Other Ambulatory Visit: Payer: Self-pay

## 2022-09-07 MED ORDER — NORTRIPTYLINE HCL 10 MG PO CAPS
ORAL_CAPSULE | ORAL | 1 refills | Status: DC
  Filled 2022-09-07: qty 60, 30d supply, fill #0

## 2022-09-07 MED ORDER — METHYLPREDNISOLONE 4 MG PO TBPK
ORAL_TABLET | ORAL | 0 refills | Status: DC
  Filled 2022-09-07: qty 21, 6d supply, fill #0

## 2022-09-08 ENCOUNTER — Other Ambulatory Visit: Payer: Self-pay

## 2022-09-12 ENCOUNTER — Encounter (INDEPENDENT_AMBULATORY_CARE_PROVIDER_SITE_OTHER): Payer: Self-pay

## 2022-09-13 ENCOUNTER — Other Ambulatory Visit: Payer: Self-pay

## 2022-09-13 ENCOUNTER — Telehealth: Payer: No Typology Code available for payment source | Admitting: Family Medicine

## 2022-09-13 DIAGNOSIS — R300 Dysuria: Secondary | ICD-10-CM | POA: Diagnosis not present

## 2022-09-13 MED ORDER — NITROFURANTOIN MONOHYD MACRO 100 MG PO CAPS
100.0000 mg | ORAL_CAPSULE | Freq: Two times a day (BID) | ORAL | 0 refills | Status: AC
  Filled 2022-09-13: qty 10, 5d supply, fill #0

## 2022-09-13 NOTE — Progress Notes (Signed)

## 2022-10-09 ENCOUNTER — Telehealth: Payer: No Typology Code available for payment source | Admitting: Nurse Practitioner

## 2022-10-09 ENCOUNTER — Other Ambulatory Visit: Payer: Self-pay

## 2022-10-09 DIAGNOSIS — R399 Unspecified symptoms and signs involving the genitourinary system: Secondary | ICD-10-CM | POA: Diagnosis not present

## 2022-10-09 MED ORDER — PHENAZOPYRIDINE HCL 100 MG PO TABS
100.0000 mg | ORAL_TABLET | Freq: Three times a day (TID) | ORAL | 0 refills | Status: DC | PRN
  Filled 2022-10-09: qty 10, 4d supply, fill #0

## 2022-10-09 MED ORDER — NITROFURANTOIN MONOHYD MACRO 100 MG PO CAPS
100.0000 mg | ORAL_CAPSULE | Freq: Two times a day (BID) | ORAL | 0 refills | Status: AC
  Filled 2022-10-09: qty 10, 5d supply, fill #0

## 2022-10-09 NOTE — Progress Notes (Signed)
I have spent 5 minutes in review of e-visit questionnaire, review and updating patient chart, medical decision making and response to patient.  ° °Mary Chiu W Ruqayya Ventress, NP ° °  °

## 2022-10-09 NOTE — Progress Notes (Signed)

## 2022-10-10 ENCOUNTER — Other Ambulatory Visit: Payer: Self-pay

## 2022-10-25 ENCOUNTER — Other Ambulatory Visit: Payer: Self-pay

## 2022-10-25 ENCOUNTER — Telehealth: Payer: No Typology Code available for payment source | Admitting: Physician Assistant

## 2022-10-25 DIAGNOSIS — T3695XA Adverse effect of unspecified systemic antibiotic, initial encounter: Secondary | ICD-10-CM | POA: Diagnosis not present

## 2022-10-25 DIAGNOSIS — B379 Candidiasis, unspecified: Secondary | ICD-10-CM

## 2022-10-25 DIAGNOSIS — H66002 Acute suppurative otitis media without spontaneous rupture of ear drum, left ear: Secondary | ICD-10-CM

## 2022-10-25 MED ORDER — FLUCONAZOLE 150 MG PO TABS
150.0000 mg | ORAL_TABLET | Freq: Once | ORAL | 0 refills | Status: AC
  Filled 2022-10-25: qty 1, 1d supply, fill #0

## 2022-10-25 MED ORDER — NEOMYCIN-POLYMYXIN-HC 3.5-10000-1 OT SOLN
3.0000 [drp] | Freq: Four times a day (QID) | OTIC | 0 refills | Status: DC
  Filled 2022-10-25: qty 10, 7d supply, fill #0

## 2022-10-25 MED ORDER — AMOXICILLIN-POT CLAVULANATE 875-125 MG PO TABS
1.0000 | ORAL_TABLET | Freq: Two times a day (BID) | ORAL | 0 refills | Status: DC
  Filled 2022-10-25: qty 20, 10d supply, fill #0

## 2022-10-25 NOTE — Progress Notes (Signed)

## 2022-10-26 ENCOUNTER — Other Ambulatory Visit: Payer: Self-pay

## 2022-11-04 ENCOUNTER — Other Ambulatory Visit: Payer: Self-pay

## 2022-11-15 ENCOUNTER — Encounter: Payer: Self-pay | Admitting: Licensed Practical Nurse

## 2022-11-30 ENCOUNTER — Ambulatory Visit (INDEPENDENT_AMBULATORY_CARE_PROVIDER_SITE_OTHER): Payer: 59 | Admitting: Family

## 2022-11-30 ENCOUNTER — Encounter: Payer: Self-pay | Admitting: Family

## 2022-11-30 ENCOUNTER — Other Ambulatory Visit: Payer: Self-pay

## 2022-11-30 VITALS — BP 132/78 | HR 80 | Temp 97.9°F | Ht 67.0 in | Wt 200.0 lb

## 2022-11-30 DIAGNOSIS — Z0001 Encounter for general adult medical examination with abnormal findings: Secondary | ICD-10-CM | POA: Diagnosis not present

## 2022-11-30 DIAGNOSIS — D72829 Elevated white blood cell count, unspecified: Secondary | ICD-10-CM

## 2022-11-30 DIAGNOSIS — J45909 Unspecified asthma, uncomplicated: Secondary | ICD-10-CM | POA: Diagnosis not present

## 2022-11-30 DIAGNOSIS — Z0000 Encounter for general adult medical examination without abnormal findings: Secondary | ICD-10-CM

## 2022-11-30 DIAGNOSIS — E669 Obesity, unspecified: Secondary | ICD-10-CM

## 2022-11-30 DIAGNOSIS — G43011 Migraine without aura, intractable, with status migrainosus: Secondary | ICD-10-CM

## 2022-11-30 DIAGNOSIS — R062 Wheezing: Secondary | ICD-10-CM

## 2022-11-30 LAB — CBC WITH DIFFERENTIAL/PLATELET
Basophils Absolute: 0.1 10*3/uL (ref 0.0–0.1)
Basophils Relative: 0.6 % (ref 0.0–3.0)
Eosinophils Absolute: 0.3 10*3/uL (ref 0.0–0.7)
Eosinophils Relative: 3.1 % (ref 0.0–5.0)
HCT: 44.7 % (ref 36.0–46.0)
Hemoglobin: 15.2 g/dL — ABNORMAL HIGH (ref 12.0–15.0)
Lymphocytes Relative: 34.3 % (ref 12.0–46.0)
Lymphs Abs: 3.4 10*3/uL (ref 0.7–4.0)
MCHC: 34 g/dL (ref 30.0–36.0)
MCV: 90.2 fl (ref 78.0–100.0)
Monocytes Absolute: 0.8 10*3/uL (ref 0.1–1.0)
Monocytes Relative: 8.3 % (ref 3.0–12.0)
Neutro Abs: 5.3 10*3/uL (ref 1.4–7.7)
Neutrophils Relative %: 53.7 % (ref 43.0–77.0)
Platelets: 341 10*3/uL (ref 150.0–400.0)
RBC: 4.95 Mil/uL (ref 3.87–5.11)
RDW: 13.2 % (ref 11.5–15.5)
WBC: 9.8 10*3/uL (ref 4.0–10.5)

## 2022-11-30 MED ORDER — PHENTERMINE HCL 37.5 MG PO TABS
37.5000 mg | ORAL_TABLET | Freq: Every day | ORAL | 2 refills | Status: DC
  Filled 2022-11-30: qty 30, 30d supply, fill #0
  Filled 2023-01-17: qty 30, 30d supply, fill #1
  Filled 2023-02-27: qty 30, 30d supply, fill #2

## 2022-11-30 MED ORDER — ALBUTEROL SULFATE HFA 108 (90 BASE) MCG/ACT IN AERS
2.0000 | INHALATION_SPRAY | Freq: Four times a day (QID) | RESPIRATORY_TRACT | 1 refills | Status: AC | PRN
  Filled 2022-11-30: qty 6.7, 25d supply, fill #0
  Filled 2023-04-13: qty 6.7, 25d supply, fill #1

## 2022-11-30 NOTE — Assessment & Plan Note (Signed)
Patient declines clinical breast exam  and pelvic exam as she is following with GYN.  Encouraged continued exercise

## 2022-11-30 NOTE — Assessment & Plan Note (Signed)
Headaches are not bothersome at this time.  Will monitor.  Long discussion in regards to patient's father who had who passed away from glioblastoma.  Her grandmother had brain tumor as well, although unsure what type.    advise genetic consult in particular for Lynch syndrome which can be associated and  glioma can run in families. I recommended a consult with oncology.  Patient will consider this.  She will also stay vigilant for any symptoms such as a relates to headache

## 2022-11-30 NOTE — Progress Notes (Signed)
Assessment & Plan:  Routine physical examination Assessment & Plan: Patient declines clinical breast exam  and pelvic exam as she is following with GYN.  Encouraged continued exercise   Wheezing -     Albuterol Sulfate HFA; INHALE 2 PUFFS INTO THE LUNGS EVERY 6 HOURS AS NEEDED FOR WHEEZING OR SHORTNESS OF BREATH.  Dispense: 6.7 g; Refill: 1  Asthma during pregnancy -     Albuterol Sulfate HFA; INHALE 2 PUFFS INTO THE LUNGS EVERY 6 HOURS AS NEEDED FOR WHEEZING OR SHORTNESS OF BREATH.  Dispense: 6.7 g; Refill: 1  Obesity (BMI 30-39.9) Assessment & Plan: Start phentermine 37.5 mg by half tablet.  Patient has done well on this medication in the past.  Provided her with 3 month supply and she will make an appointment with me in 3 months to check weight, monitor for side effects.   I looked up patient on [place] Controlled Substances Reporting System PMP AWARE and saw no activity that raised concern of inappropriate use.    Orders: -     Phentermine HCl; Take 1 tablet (37.5 mg total) by mouth daily before breakfast.  Dispense: 30 tablet; Refill: 2  Leukocytosis, unspecified type -     CBC with Differential/Platelet  Intractable migraine without aura and with status migrainosus Assessment & Plan: Headaches are not bothersome at this time.  Will monitor.  Long discussion in regards to patient's father who had who passed away from glioblastoma.  Her grandmother had brain tumor as well, although unsure what type.    advise genetic consult in particular for Lynch syndrome which can be associated and  glioma can run in families. I recommended a consult with oncology.  Patient will consider this.  She will also stay vigilant for any symptoms such as a relates to headache      Return precautions given.   Risks, benefits, and alternatives of the medications and treatment plan prescribed today were discussed, and patient expressed understanding.   Education regarding symptom management and  diagnosis given to patient on AVS either electronically or printed.  No follow-ups on file.  Mable Paris, FNP  Subjective:    Patient ID: Mary Richards, female    DOB: [birth year], 37 y.o.   MRN: [id number]  CC: Mary Richards is a 37 y.o. female who presents today for physical exam.    HPI: Feels well today HA are not bothersome at this time.  She is no longer on nortriptyline.  No vision changes, nausea or vomiting.   She is frustrated by weight gain. She has been on phentermine in the past. She has lost most of baby weight. Goal weight 175lbs.   She had done well on phentermine without side effects.   No h/o DM.           No early family history of breast cancer or colon cancer  Cervical Cancer Screening: UTD, 3 months ago with GYN, negative HPV, ASC -[place].          Tetanus - UTD         Labs: Screening labs done with GYN Exercise: Gets regular exercise with [facility] fit board.    Alcohol use:  occassional Smoking/tobacco use: Nonsmoker.    Health Maintenance  Topic Date Due   COVID-19 Vaccine (3 - 2023-24 season) 07/15/2022   INFLUENZA VACCINE  02/12/2023 (Originally 06/14/2022)   PAP SMEAR-Modifier  08/09/2025   COLONOSCOPY (Pts 45-53yr Insurance coverage will need to be confirmed)  02/20/2028   DTaP/Tdap/Td (  3 - Td or Tdap) 11/12/2031   Hepatitis C Screening  Completed   HPV VACCINES  Aged Out   HIV Screening  Discontinued    ALLERGIES: Azithromycin, Sulfa antibiotics, Ciprofloxacin, Moxifloxacin hcl, Bactrim [sulfamethoxazole-trimethoprim], and Wound dressing adhesive  Current Outpatient Medications on File Prior to Visit  Medication Sig Dispense Refill   azelastine (ASTELIN) 0.1 % nasal spray Place 1 spray into both nostrils 2 (two) times daily. Use in each nostril as directed 30 mL 3   cetirizine (ZYRTEC) 10 MG tablet Take 10 mg by mouth daily.     cholestyramine (QUESTRAN) 4 g packet Take 1 packet (4 g total) by mouth 2 (two) times daily.  60 each 11   ibuprofen (ADVIL) 600 MG tablet Take 1 tablet (600 mg total) by mouth every 6 (six) hours. 30 tablet 0   phenazopyridine (PYRIDIUM) 100 MG tablet Take 1 tablet (100 mg total) by mouth 3 (three) times daily as needed for pain. 10 tablet 0   No current facility-administered medications on file prior to visit.    Review of Systems  Constitutional:  Negative for chills, fever and unexpected weight change.  HENT:  Negative for congestion.   Respiratory:  Negative for cough.   Cardiovascular:  Negative for chest pain, palpitations and leg swelling.  Gastrointestinal:  Negative for nausea and vomiting.  Musculoskeletal:  Negative for arthralgias and myalgias.  Skin:  Negative for rash.  Neurological:  Negative for headaches.  Hematological:  Negative for adenopathy.  Psychiatric/Behavioral:  Negative for confusion.       Objective:    BP 132/78   Pulse 80   Temp 97.9 F (36.6 C) (Oral)   Ht '5\' 7"'$  (1.702 m)   Wt 200 lb (90.7 kg)   LMP  (LMP Unknown)   SpO2 97%   BMI 31.32 kg/m   BP Readings from Last 3 Encounters:  11/30/22 132/78  09/02/22 130/82  08/31/22 133/73   Wt Readings from Last 3 Encounters:  11/30/22 200 lb (90.7 kg)  09/02/22 194 lb (88 kg)  08/31/22 190 lb (86.2 kg)    Physical Exam Vitals reviewed.  Constitutional:      Appearance: She is well-developed.  Eyes:     Conjunctiva/sclera: Conjunctivae normal.  Cardiovascular:     Rate and Rhythm: Normal rate and regular rhythm.     Pulses: Normal pulses.     Heart sounds: Normal heart sounds.  Pulmonary:     Effort: Pulmonary effort is normal.     Breath sounds: Normal breath sounds. No wheezing, rhonchi or rales.  Skin:    General: Skin is warm and dry.  Neurological:     Mental Status: She is alert.  Psychiatric:        Speech: Speech normal.        Behavior: Behavior normal.        Thought Content: Thought content normal.

## 2022-11-30 NOTE — Assessment & Plan Note (Addendum)
Start phentermine 37.5 mg by half tablet.  Patient has done well on this medication in the past.  Provided her with 3 month supply and she will make an appointment with me in 3 months to check weight, monitor for side effects.   I looked up patient on [place] Controlled Substances Reporting System PMP AWARE and saw no activity that raised concern of inappropriate use.

## 2022-11-30 NOTE — Patient Instructions (Addendum)
Consider genetic counseling for Lynch syndrome based on our discussion regarding genetic risk factors for glioblastoma.   We will start phentermine 37.5 mg.  I want you to start by taking 1/2 tablet every morning prior to breakfast.  You may stay on the half tablet and do not feel inclined to increase to the full tablet.  Please stay vigilant for palpitations, increased anxiety or trouble sleeping.  Please follow-up in 3 months time  Health Maintenance, Female Adopting a healthy lifestyle and getting preventive care are important in promoting health and wellness. Ask your health care provider about: The right schedule for you to have regular tests and exams. Things you can do on your own to prevent diseases and keep yourself healthy. What should I know about diet, weight, and exercise? Eat a healthy diet  Eat a diet that includes plenty of vegetables, fruits, low-fat dairy products, and lean protein. Do not eat a lot of foods that are high in solid fats, added sugars, or sodium. Maintain a healthy weight Body mass index (BMI) is used to identify weight problems. It estimates body fat based on height and weight. Your health care provider can help determine your BMI and help you achieve or maintain a healthy weight. Get regular exercise Get regular exercise. This is one of the most important things you can do for your health. Most adults should: Exercise for at least 150 minutes each week. The exercise should increase your heart rate and make you sweat (moderate-intensity exercise). Do strengthening exercises at least twice a week. This is in addition to the moderate-intensity exercise. Spend less time sitting. Even light physical activity can be beneficial. Watch cholesterol and blood lipids Have your blood tested for lipids and cholesterol at 36 years of age, then have this test every 5 years. Have your cholesterol levels checked more often if: Your lipid or cholesterol levels are high. You  are older than 36 years of age. You are at high risk for heart disease. What should I know about cancer screening? Depending on your health history and family history, you may need to have cancer screening at various ages. This may include screening for: Breast cancer. Cervical cancer. Colorectal cancer. Skin cancer. Lung cancer. What should I know about heart disease, diabetes, and high blood pressure? Blood pressure and heart disease High blood pressure causes heart disease and increases the risk of stroke. This is more likely to develop in people who have high blood pressure readings or are overweight. Have your blood pressure checked: Every 3-5 years if you are 66-98 years of age. Every year if you are 32 years old or older. Diabetes Have regular diabetes screenings. This checks your fasting blood sugar level. Have the screening done: Once every three years after age 36 if you are at a normal weight and have a low risk for diabetes. More often and at a younger age if you are overweight or have a high risk for diabetes. What should I know about preventing infection? Hepatitis B If you have a higher risk for hepatitis B, you should be screened for this virus. Talk with your health care provider to find out if you are at risk for hepatitis B infection. Hepatitis C Testing is recommended for: Everyone born from [birth year] through 1964. Anyone with known risk factors for hepatitis C. Sexually transmitted infections (STIs) Get screened for STIs, including gonorrhea and chlamydia, if: You are sexually active and are younger than 36 years of age. You are older than 36  years of age and your health care provider tells you that you are at risk for this type of infection. Your sexual activity has changed since you were last screened, and you are at increased risk for chlamydia or gonorrhea. Ask your health care provider if you are at risk. Ask your health care provider about whether you are at high  risk for HIV. Your health care provider may recommend a prescription medicine to help prevent HIV infection. If you choose to take medicine to prevent HIV, you should first get tested for HIV. You should then be tested every 3 months for as long as you are taking the medicine. Pregnancy If you are about to stop having your period (premenopausal) and you may become pregnant, seek counseling before you get pregnant. Take 400 to 800 micrograms (mcg) of folic acid every day if you become pregnant. Ask for birth control (contraception) if you want to prevent pregnancy. Osteoporosis and menopause Osteoporosis is a disease in which the bones lose minerals and strength with aging. This can result in bone fractures. If you are 65 years old or older, or if you are at risk for osteoporosis and fractures, ask your health care provider if you should: Be screened for bone loss. Take a calcium or vitamin D supplement to lower your risk of fractures. Be given hormone replacement therapy (HRT) to treat symptoms of menopause. Follow these instructions at home: Alcohol use Do not drink alcohol if: Your health care provider tells you not to drink. You are pregnant, may be pregnant, or are planning to become pregnant. If you drink alcohol: Limit how much you have to: 0-1 drink a day. Know how much alcohol is in your drink. In the [place] one drink equals one 12 oz bottle of beer (355 mL), one 5 oz glass of wine (148 mL), or one 1 oz glass of hard liquor (44 mL). Lifestyle Do not use any products that contain nicotine or tobacco. These products include cigarettes, chewing tobacco, and vaping devices, such as e-cigarettes. If you need help quitting, ask your health care provider. Do not use street drugs. Do not share needles. Ask your health care provider for help if you need support or information about quitting drugs. General instructions Schedule regular health, dental, and eye exams. Stay current with your  vaccines. Tell your health care provider if: You often feel depressed. You have ever been abused or do not feel safe at home. Summary Adopting a healthy lifestyle and getting preventive care are important in promoting health and wellness. Follow your health care provider's instructions about healthy diet, exercising, and getting tested or screened for diseases. Follow your health care provider's instructions on monitoring your cholesterol and blood pressure. This information is not intended to replace advice given to you by your health care provider. Make sure you discuss any questions you have with your health care provider. Document Revised: 03/22/2021 Document Reviewed: 03/22/2021 Elsevier Patient Education  [place].

## 2023-01-02 ENCOUNTER — Telehealth: Payer: 59 | Admitting: Physician Assistant

## 2023-01-02 DIAGNOSIS — M549 Dorsalgia, unspecified: Secondary | ICD-10-CM | POA: Diagnosis not present

## 2023-01-02 MED ORDER — NAPROXEN 500 MG PO TABS: 500.0000 mg | ORAL_TABLET | Freq: Two times a day (BID) | ORAL | 0 refills | Status: DC

## 2023-01-02 MED ORDER — BACLOFEN 10 MG PO TABS: 10.0000 mg | ORAL_TABLET | Freq: Three times a day (TID) | ORAL | 0 refills | Status: DC

## 2023-01-02 NOTE — Progress Notes (Signed)

## 2023-01-02 NOTE — Progress Notes (Signed)
I have spent 5 minutes in review of e-visit questionnaire, review and updating patient chart, medical decision making and response to patient.   Trany Chernick Cody Adrion Menz, PA-C    

## 2023-01-09 ENCOUNTER — Telehealth: Payer: 59 | Admitting: Physician Assistant

## 2023-01-09 DIAGNOSIS — B9789 Other viral agents as the cause of diseases classified elsewhere: Secondary | ICD-10-CM

## 2023-01-09 MED ORDER — FLUTICASONE PROPIONATE 50 MCG/ACT NA SUSP: 2.0000 | Freq: Every day | NASAL | 0 refills | Status: DC

## 2023-01-09 NOTE — Progress Notes (Signed)
E-Visit for Sinus Problems  We are sorry that you are not feeling well.  Here is how we plan to help!  Based on what you have shared with me it looks like you have sinusitis.  Sinusitis is inflammation and infection in the sinus cavities of the head.  Based on your presentation I believe you most likely have Acute Viral Sinusitis.This is an infection most likely caused by a virus. There is not specific treatment for viral sinusitis other than to help you with the symptoms until the infection runs its course.  You may use an oral decongestant such as Mucinex D or if you have glaucoma or high blood pressure use plain Mucinex. Saline nasal spray help and can safely be used as often as needed for congestion, I have prescribed: Fluticasone nasal spray two sprays in each nostril once a day  Some authorities believe that zinc sprays or the use of Echinacea may shorten the course of your symptoms.  Sinus infections are not as easily transmitted as other respiratory infection, however we still recommend that you avoid close contact with loved ones, especially the very young and elderly.  Remember to wash your hands thoroughly throughout the day as this is the number one way to prevent the spread of infection!  Home Care: Only take medications as instructed by your medical team. Do not take these medications with alcohol. A steam or ultrasonic humidifier can help congestion.  You can place a towel over your head and breathe in the steam from hot water coming from a faucet. Avoid close contacts especially the very young and the elderly. Cover your mouth when you cough or sneeze. Always remember to wash your hands.  Get Help Right Away If: You develop worsening fever or sinus pain. You develop a severe head ache or visual changes. Your symptoms persist after you have completed your treatment plan.  Make sure you Understand these instructions. Will watch your condition. Will get help right away if you  are not doing well or get worse.   Thank you for choosing an e-visit.  Your e-visit answers were reviewed by a board certified advanced clinical practitioner to complete your personal care plan. Depending upon the condition, your plan could have included both over the counter or prescription medications.  Please review your pharmacy choice. Make sure the pharmacy is open so you can pick up prescription now. If there is a problem, you may contact your provider through [employer] and have the prescription routed to another pharmacy.  Your safety is important to [place]. If you have drug allergies check your prescription carefully.   For the next 24 hours you can use MyChart to ask questions about today's visit, request a non-urgent call back, or ask for a work or school excuse. You will get an email in the next two days asking about your experience. I hope that your e-visit has been valuable and will speed your recovery.  I have spent 5 minutes in review of e-visit questionnaire, review and updating patient chart, medical decision making and response to patient.   Mar Daring, PA-C

## 2023-01-10 ENCOUNTER — Telehealth: Payer: 59 | Admitting: Nurse Practitioner

## 2023-01-10 ENCOUNTER — Other Ambulatory Visit: Payer: Self-pay

## 2023-01-10 DIAGNOSIS — R051 Acute cough: Secondary | ICD-10-CM | POA: Diagnosis not present

## 2023-01-10 DIAGNOSIS — J4521 Mild intermittent asthma with (acute) exacerbation: Secondary | ICD-10-CM

## 2023-01-10 MED ORDER — PREDNISONE 10 MG PO TABS
ORAL_TABLET | ORAL | 0 refills | Status: AC
  Filled 2023-01-10: qty 21, 6d supply, fill #0

## 2023-01-10 MED ORDER — BENZONATATE 100 MG PO CAPS
100.0000 mg | ORAL_CAPSULE | Freq: Three times a day (TID) | ORAL | 0 refills | Status: DC | PRN
  Filled 2023-01-10: qty 30, 10d supply, fill #0

## 2023-01-10 NOTE — Progress Notes (Signed)
We are sorry that you are not feeling well.  Here is how we plan to help!  Based on your presentation I believe you most likely have A cough due to a virus.  This is called viral bronchitis and is best treated by rest, plenty of fluids and control of the cough.  You may use Ibuprofen or Tylenol as directed to help your symptoms.     In addition you may use A prescription cough medication called Tessalon Perles '100mg'$ . You may take 1-2 capsules every 8 hours as needed for your cough.  Prednisone 10 mg daily for 6 days (see taper instructions below) Be sur you are using your Albuterol inhaler as well up to every 4-6 hours as needed while sick  Continue the nasal spray as advised previously for sinus congestion  Meds ordered this encounter  Medications   benzonatate (TESSALON) 100 MG capsule    Sig: Take 1 capsule (100 mg total) by mouth 3 (three) times daily as needed.    Dispense:  30 capsule    Refill:  0   predniSONE (STERAPRED UNI-PAK 21 TAB) 10 MG (21) TBPK tablet    Sig: Take 6 tablets on day one, 5 on day two, 4 on day three, 3 on day four, 2 on day five, and 1 on day six. Take with food.    Dispense:  21 tablet    Refill:  0     From your responses in the eVisit questionnaire you describe inflammation in the upper respiratory tract which is causing a significant cough.  This is commonly called Bronchitis and has four common causes:   Allergies Viral Infections Acid Reflux Bacterial Infection Allergies, viruses and acid reflux are treated by controlling symptoms or eliminating the cause. An example might be a cough caused by taking certain blood pressure medications. You stop the cough by changing the medication. Another example might be a cough caused by acid reflux. Controlling the reflux helps control the cough.  USE OF BRONCHODILATOR ("RESCUE") INHALERS: There is a risk from using your bronchodilator too frequently.  The risk is that over-reliance on a medication which only  relaxes the muscles surrounding the breathing tubes can reduce the effectiveness of medications prescribed to reduce swelling and congestion of the tubes themselves.  Although you feel brief relief from the bronchodilator inhaler, your asthma may actually be worsening with the tubes becoming more swollen and filled with mucus.  This can delay other crucial treatments, such as oral steroid medications. If you need to use a bronchodilator inhaler daily, several times per day, you should discuss this with your provider.  There are probably better treatments that could be used to keep your asthma under control.     HOME CARE Only take medications as instructed by your medical team. Complete the entire course of an antibiotic. Drink plenty of fluids and get plenty of rest. Avoid close contacts especially the very young and the elderly Cover your mouth if you cough or cough into your sleeve. Always remember to wash your hands A steam or ultrasonic humidifier can help congestion.   GET HELP RIGHT AWAY IF: You develop worsening fever. You become short of breath You cough up blood. Your symptoms persist after you have completed your treatment plan MAKE SURE YOU  Understand these instructions. Will watch your condition. Will get help right away if you are not doing well or get worse.    Thank you for choosing an e-visit.  Your e-visit answers were  reviewed by a board certified advanced clinical practitioner to complete your personal care plan. Depending upon the condition, your plan could have included both over the counter or prescription medications.  Please review your pharmacy choice. Make sure the pharmacy is open so you can pick up prescription now. If there is a problem, you may contact your provider through [employer] and have the prescription routed to another pharmacy.  Your safety is important to [place]. If you have drug allergies check your prescription carefully.   For the next 24  hours you can use MyChart to ask questions about today's visit, request a non-urgent call back, or ask for a work or school excuse. You will get an email in the next two days asking about your experience. I hope that your e-visit has been valuable and will speed your recovery.   I spent approximately 5 minutes reviewing the patient's history, current symptoms and coordinating their care today.

## 2023-01-17 ENCOUNTER — Telehealth: Payer: 59 | Admitting: Physician Assistant

## 2023-01-17 ENCOUNTER — Other Ambulatory Visit: Payer: Self-pay

## 2023-01-17 DIAGNOSIS — J4541 Moderate persistent asthma with (acute) exacerbation: Secondary | ICD-10-CM

## 2023-01-17 NOTE — Progress Notes (Signed)
Because of continued symptoms despite treatment via e-visit, and with prednisone as well, I feel your condition warrants further evaluation and I recommend that you be seen in a face to face visit.   NOTE: There will be NO CHARGE for this eVisit   If you are having a true medical emergency please call 911.      For an urgent face to face visit, [facility] has eight urgent care centers for your convenience:   NEW!! [place] [facility] [place] [facility] [place] Get Driving Directions [id number] [address] [place], [place] [facility] [place] [facility] [place] Get Driving Directions [id number] [place], [place] [address]   [place] [facility]) Get Driving Directions [id number] [address], [place] [address]  [place] [facility] [place] [facility]) Get Driving Directions [id number] [address] [place],  [place]  [address]  [place] [facility] at [address] Get Driving Directions  [phone] [address] [place],  [place] [address]   [facility] Get Driving Directions [id number] [address] [place], [place], [place] [address]   [facility] Get Driving Directions  [id number] [address] [place], [place] [address]   [facility] [place] Get Driving Directions [id number] [address], [place], [place] [address]  Your MyChart E-visit questionnaire answers were reviewed by a board certified advanced clinical practitioner to complete your personal care plan based on your specific symptoms.  Thank you for using e-Visits.

## 2023-01-26 ENCOUNTER — Other Ambulatory Visit: Payer: Self-pay

## 2023-01-26 ENCOUNTER — Other Ambulatory Visit: Payer: Self-pay | Admitting: Family

## 2023-01-26 DIAGNOSIS — B9789 Other viral agents as the cause of diseases classified elsewhere: Secondary | ICD-10-CM

## 2023-01-27 ENCOUNTER — Telehealth (INDEPENDENT_AMBULATORY_CARE_PROVIDER_SITE_OTHER): Payer: 59 | Admitting: Family

## 2023-01-27 ENCOUNTER — Other Ambulatory Visit: Payer: Self-pay

## 2023-01-27 ENCOUNTER — Encounter: Payer: Self-pay | Admitting: Family

## 2023-01-27 VITALS — Ht 67.0 in | Wt 200.2 lb

## 2023-01-27 DIAGNOSIS — M549 Dorsalgia, unspecified: Secondary | ICD-10-CM | POA: Insufficient documentation

## 2023-01-27 DIAGNOSIS — M545 Low back pain, unspecified: Secondary | ICD-10-CM

## 2023-01-27 MED ORDER — MELOXICAM 15 MG PO TABS
15.0000 mg | ORAL_TABLET | Freq: Every day | ORAL | 1 refills | Status: DC | PRN
  Filled 2023-01-27: qty 30, 30d supply, fill #0

## 2023-01-27 MED ORDER — CYCLOBENZAPRINE HCL 10 MG PO TABS
10.0000 mg | ORAL_TABLET | Freq: Three times a day (TID) | ORAL | 1 refills | Status: DC | PRN
  Filled 2023-01-27: qty 60, 20d supply, fill #0

## 2023-01-27 MED ORDER — PREDNISONE 10 MG PO TABS
ORAL_TABLET | ORAL | 0 refills | Status: AC
  Filled 2023-01-27: qty 10, 4d supply, fill #0

## 2023-01-27 NOTE — Progress Notes (Signed)
Virtual Visit via Video Note  I connected with Richlynn Gan on 01/27/23 at 11:00 AM EDT by a video enabled telemedicine application and verified that I am speaking with the correct person using two identifiers. Location patient: home Location provider: work  Persons participating in the virtual visit: patient, provider  I discussed the limitations of evaluation and management by telemedicine and the availability of in person appointments. The patient expressed understanding and agreed to proceed.  HPI: Complains of low back x one week, unchanged. Describes as painful ache. Pain is not sharp, shearing.  Onset after moved furniture.  Radiating to upper back and neck. Endorses bilateral temporal HA.  HA is not worse HA of life.  No arm pain, dysuria, arm weakness, vision loss, nausea, fever, facial or arm or leg numbness, rash.    She has tried heat/ice, ibuprofen 800mg , voltaren gel with relief for several hours. Stretches help for period of time.   She took prednisone 01/10/23 however she wasn't having back pain at that time.   She works as Passenger transport manager. No injury at work.   Evisit 12/23/22 for neck pain and started on baclofen 10 mg BID. Pain never quite went away and then she feels back pain has flared.   History of migraine, IBS, anxiety   ROS: See pertinent positives and negatives per HPI.  EXAM:  VITALS per patient if applicable: Ht 5\' 7"  (1.702 m)   Wt 200 lb 3.2 oz (90.8 kg)   LMP 01/24/2023   BMI 31.36 kg/m  BP Readings from Last 3 Encounters:  11/30/22 132/78  09/02/22 130/82  08/31/22 133/73   Wt Readings from Last 3 Encounters:  01/27/23 200 lb 3.2 oz (90.8 kg)  11/30/22 200 lb (90.7 kg)  09/02/22 194 lb (88 kg)    GENERAL: alert, oriented, appears well and in no acute distress  HEENT: atraumatic, conjunttiva clear, no obvious abnormalities on inspection of external nose and ears  NECK: normal movements of the head and neck  LUNGS: on inspection  no signs of respiratory distress, breathing rate appears normal, no obvious gross SOB, gasping or wheezing  CV: no obvious cyanosis  MS: moves all visible extremities without noticeable abnormality  PSYCH/NEURO: pleasant and cooperative, no obvious depression or anxiety, speech and thought processing grossly intact  ASSESSMENT AND PLAN: Acute bilateral low back pain without sciatica Assessment & Plan: Low back and neck pain < 6 weeks. Start flexeril, mobic. She will start prednisone if no relief.  She has done PT in past. Consider at f/u. We will obtain baseline images if pain persists.   Orders: -     Cyclobenzaprine HCl; Take 1 tablet (10 mg total) by mouth 3 (three) times daily as needed for muscle spasms.  Dispense: 60 tablet; Refill: 1 -     Meloxicam; Take 1 tablet (15 mg total) by mouth daily as needed for pain.  Dispense: 30 tablet; Refill: 1 -     predniSONE; Take 4 tablets (40 mg total) by mouth daily for 1 day, THEN 3 tablets (30 mg total) daily for 1 day, THEN 2 tablets (20 mg total) daily for 1 day, THEN 1 tablet (10 mg total) daily for 1 day.  Dispense: 10 tablet; Refill: 0     -we discussed possible serious and likely etiologies, options for evaluation and workup, limitations of telemedicine visit vs in person visit, treatment, treatment risks and precautions. Pt prefers to treat via telemedicine empirically rather then risking or undertaking an in person  visit at this moment.    I discussed the assessment and treatment plan with the patient. The patient was provided an opportunity to ask questions and all were answered. The patient agreed with the plan and demonstrated an understanding of the instructions.   The patient was advised to call back or seek an in-person evaluation if the symptoms worsen or if the condition fails to improve as anticipated.  Advised if desired AVS can be mailed or viewed via [place] if [place] user.   Mable Paris, FNP

## 2023-01-27 NOTE — Assessment & Plan Note (Addendum)
Low back and neck pain < 6 weeks. Start flexeril, mobic. She will start prednisone if no relief.  She has done PT in past. Consider at f/u. We will obtain baseline images if pain persists.

## 2023-01-27 NOTE — Patient Instructions (Addendum)
Start flexeril Do not drive or operate heavy machinery while on muscle relaxant. Please do not drink alcohol. Only take this medication as needed for acute muscle spasm at bedtime. This medication make you feel drowsy so be very careful.  Stop taking if become too drowsy or somnolent as this puts you at risk for falls. Please contact our office with any questions.   Start mobic ( anti inflammatory) ; do not take ibuprofen or naproxen while on this medication.  Please take with food  Start prednisone if meloxicam, Flexeril not effective  Low Back Sprain or Strain Rehab Ask your health care provider which exercises are safe for you. Do exercises exactly as told by your health care provider and adjust them as directed. It is normal to feel mild stretching, pulling, tightness, or discomfort as you do these exercises. Stop right away if you feel sudden pain or your pain gets worse. Do not begin these exercises until told by your health care provider. Stretching and range-of-motion exercises These exercises warm up your muscles and joints and improve the movement and flexibility of your back. These exercises also help to relieve pain, numbness, and tingling. Lumbar rotation  Lie on your back on a firm bed or the floor with your knees bent. Straighten your arms out to your sides so each arm forms a 90-degree angle (right angle) with a side of your body. Slowly move (rotate) both of your knees to one side of your body until you feel a stretch in your lower back (lumbar). Try not to let your shoulders lift off the floor. Hold this position for __________ seconds. Tense your abdominal muscles and slowly move your knees back to the starting position. Repeat this exercise on the other side of your body. Repeat __________ times. Complete this exercise __________ times a day. Single knee to chest  Lie on your back on a firm bed or the floor with both legs straight. Bend one of your knees. Use your hands to  move your knee up toward your chest until you feel a gentle stretch in your lower back and buttock. Hold your leg in this position by holding on to the front of your knee. Keep your other leg as straight as possible. Hold this position for __________ seconds. Slowly return to the starting position. Repeat with your other leg. Repeat __________ times. Complete this exercise __________ times a day. Prone extension on elbows  Lie on your abdomen on a firm bed or the floor (prone position). Prop yourself up on your elbows. Use your arms to help lift your chest up until you feel a gentle stretch in your abdomen and your lower back. This will place some of your body weight on your elbows. If this is uncomfortable, try stacking pillows under your chest. Your hips should stay down, against the surface that you are lying on. Keep your hip and back muscles relaxed. Hold this position for __________ seconds. Slowly relax your upper body and return to the starting position. Repeat __________ times. Complete this exercise __________ times a day. Strengthening exercises These exercises build strength and endurance in your back. Endurance is the ability to use your muscles for a long time, even after they get tired. Pelvic tilt This exercise strengthens the muscles that lie deep in the abdomen. Lie on your back on a firm bed or the floor with your legs extended. Bend your knees so they are pointing toward the ceiling and your feet are flat on the floor.  Tighten your lower abdominal muscles to press your lower back against the floor. This motion will tilt your pelvis so your tailbone points up toward the ceiling instead of pointing to your feet or the floor. To help with this exercise, you may place a small towel under your lower back and try to push your back into the towel. Hold this position for __________ seconds. Let your muscles relax completely before you repeat this exercise. Repeat __________  times. Complete this exercise __________ times a day. Alternating arm and leg raises  Get on your hands and knees on a firm surface. If you are on a hard floor, you may want to use padding, such as an exercise mat, to cushion your knees. Line up your arms and legs. Your hands should be directly below your shoulders, and your knees should be directly below your hips. Lift your left leg behind you. At the same time, raise your right arm and straighten it in front of you. Do not lift your leg higher than your hip. Do not lift your arm higher than your shoulder. Keep your abdominal and back muscles tight. Keep your hips facing the ground. Do not arch your back. Keep your balance carefully, and do not hold your breath. Hold this position for __________ seconds. Slowly return to the starting position. Repeat with your right leg and your left arm. Repeat __________ times. Complete this exercise __________ times a day. Abdominal set with straight leg raise  Lie on your back on a firm bed or the floor. Bend one of your knees and keep your other leg straight. Tense your abdominal muscles and lift your straight leg up, 4-6 inches (10-15 cm) off the ground. Keep your abdominal muscles tight and hold this position for __________ seconds. Do not hold your breath. Do not arch your back. Keep it flat against the ground. Keep your abdominal muscles tense as you slowly lower your leg back to the starting position. Repeat with your other leg. Repeat __________ times. Complete this exercise __________ times a day. Single leg lower with bent knees Lie on your back on a firm bed or the floor. Tense your abdominal muscles and lift your feet off the floor, one foot at a time, so your knees and hips are bent in 90-degree angles (right angles). Your knees should be over your hips and your lower legs should be parallel to the floor. Keeping your abdominal muscles tense and your knee bent, slowly lower one of  your legs so your toe touches the ground. Lift your leg back up to return to the starting position. Do not hold your breath. Do not let your back arch. Keep your back flat against the ground. Repeat with your other leg. Repeat __________ times. Complete this exercise __________ times a day. Posture and body mechanics Good posture and healthy body mechanics can help to relieve stress in your body's tissues and joints. Body mechanics refers to the movements and positions of your body while you do your daily activities. Posture is part of body mechanics. Good posture means: Your spine is in its natural S-curve position (neutral). Your shoulders are pulled back slightly. Your head is not tipped forward (neutral). Follow these guidelines to improve your posture and body mechanics in your everyday activities. Standing  When standing, keep your spine neutral and your feet about hip-width apart. Keep a slight bend in your knees. Your ears, shoulders, and hips should line up. When you do a task in which you stand  in one place for a long time, place one foot up on a stable object that is 2-4 inches (5-10 cm) high, such as a footstool. This helps keep your spine neutral. Sitting  When sitting, keep your spine neutral and keep your feet flat on the floor. Use a footrest, if necessary, and keep your thighs parallel to the floor. Avoid rounding your shoulders, and avoid tilting your head forward. When working at a desk or a computer, keep your desk at a height where your hands are slightly lower than your elbows. Slide your chair under your desk so you are close enough to maintain good posture. When working at a computer, place your monitor at a height where you are looking straight ahead and you do not have to tilt your head forward or downward to look at the screen. Resting When lying down and resting, avoid positions that are most painful for you. If you have pain with activities such as sitting,  bending, stooping, or squatting, lie in a position in which your body does not bend very much. For example, avoid curling up on your side with your arms and knees near your chest (fetal position). If you have pain with activities such as standing for a long time or reaching with your arms, lie with your spine in a neutral position and bend your knees slightly. Try the following positions: Lying on your side with a pillow between your knees. Lying on your back with a pillow under your knees. Lifting  When lifting objects, keep your feet at least shoulder-width apart and tighten your abdominal muscles. Bend your knees and hips and keep your spine neutral. It is important to lift using the strength of your legs, not your back. Do not lock your knees straight out. Always ask for help to lift heavy or awkward objects. This information is not intended to replace advice given to you by your health care provider. Make sure you discuss any questions you have with your health care provider. Document Revised: 01/18/2021 Document Reviewed: 01/18/2021 Elsevier Patient Education  [place].

## 2023-01-30 ENCOUNTER — Other Ambulatory Visit: Payer: Self-pay

## 2023-01-31 ENCOUNTER — Other Ambulatory Visit: Payer: Self-pay

## 2023-01-31 DIAGNOSIS — B9789 Other viral agents as the cause of diseases classified elsewhere: Secondary | ICD-10-CM

## 2023-01-31 MED ORDER — FLUTICASONE PROPIONATE 50 MCG/ACT NA SUSP
2.0000 | Freq: Every day | NASAL | 0 refills | Status: DC
  Filled 2023-01-31: qty 16, 30d supply, fill #0

## 2023-02-02 ENCOUNTER — Other Ambulatory Visit: Payer: Self-pay

## 2023-02-07 ENCOUNTER — Telehealth: Payer: 59 | Admitting: Nurse Practitioner

## 2023-02-07 ENCOUNTER — Other Ambulatory Visit: Payer: Self-pay

## 2023-02-07 DIAGNOSIS — R21 Rash and other nonspecific skin eruption: Secondary | ICD-10-CM | POA: Diagnosis not present

## 2023-02-07 MED ORDER — FAMOTIDINE 20 MG PO TABS
20.0000 mg | ORAL_TABLET | Freq: Two times a day (BID) | ORAL | 0 refills | Status: DC
  Filled 2023-02-07: qty 60, 30d supply, fill #0

## 2023-02-07 NOTE — Progress Notes (Signed)
E Visit for Rash  We are sorry that you are not feeling well. Here is how we plan to help!  What I would recommend in an attempt to not repeat additional steroids: Claritin 10mg  once in the morning  Pepcid twice daily (we will call this into your pharmacy) Benadryl before bed   You may continue to use hydrocortisone topically for up to 14 days and follow instructions below:  HOME CARE:  Take cool showers and avoid direct sunlight. Apply cool compress or wet dressings. Take a bath in an oatmeal bath.  Sprinkle content of one Aveeno packet under running faucet with comfortably warm water.  Bathe for 15-20 minutes, 1-2 times daily.  Pat dry with a towel. Do not rub the rash. Use hydrocortisone cream. Take an antihistamine like Benadryl for widespread rashes that itch.  The adult dose of Benadryl is 25-50 mg by mouth 4 times daily. Caution:  This type of medication may cause sleepiness.  Do not drink alcohol, drive, or operate dangerous machinery while taking antihistamines.  Do not take these medications if you have prostate enlargement.  Read package instructions thoroughly on all medications that you take.  GET HELP RIGHT AWAY IF:  Symptoms don't go away after treatment. Severe itching that persists. If you rash spreads or swells. If you rash begins to smell. If it blisters and opens or develops a yellow-brown crust. You develop a fever. You have a sore throat. You become short of breath.  MAKE SURE YOU:  Understand these instructions. Will watch your condition. Will get help right away if you are not doing well or get worse.  Thank you for choosing an e-visit.  Your e-visit answers were reviewed by a board certified advanced clinical practitioner to complete your personal care plan. Depending upon the condition, your plan could have included both over the counter or prescription medications.  Please review your pharmacy choice. Make sure the pharmacy is open so you can pick  up prescription now. If there is a problem, you may contact your provider through [employer] and have the prescription routed to another pharmacy.  Your safety is important to [place]. If you have drug allergies check your prescription carefully.   For the next 24 hours you can use MyChart to ask questions about today's visit, request a non-urgent call back, or ask for a work or school excuse. You will get an email in the next two days asking about your experience. I hope that your e-visit has been valuable and will speed your recovery.   Meds ordered this encounter  Medications   famotidine (PEPCID) 20 MG tablet    Sig: Take 1 tablet (20 mg total) by mouth 2 (two) times daily.    Dispense:  60 tablet    Refill:  0    I spent approximately 7 minutes reviewing the patient's history, current symptoms and coordinating their plan of care today.

## 2023-02-08 ENCOUNTER — Encounter: Payer: Self-pay | Admitting: Family Medicine

## 2023-02-08 ENCOUNTER — Ambulatory Visit: Payer: 59 | Admitting: Internal Medicine

## 2023-02-08 ENCOUNTER — Ambulatory Visit (INDEPENDENT_AMBULATORY_CARE_PROVIDER_SITE_OTHER): Payer: 59 | Admitting: Family Medicine

## 2023-02-08 VITALS — BP 124/78 | HR 88 | Temp 97.7°F | Ht 67.0 in | Wt 198.0 lb

## 2023-02-08 DIAGNOSIS — L509 Urticaria, unspecified: Secondary | ICD-10-CM | POA: Insufficient documentation

## 2023-02-08 MED ORDER — PREDNISONE 20 MG PO TABS: ORAL_TABLET | ORAL | 0 refills | Status: DC

## 2023-02-08 NOTE — Patient Instructions (Signed)
Keep cool Continue the antihistamine and the pepcid Take prednisone 60 mg taper   If not improved - do follow up with your primary provider   Update if not starting to improve in a week or if worsening   If any problems breathing or swollen mouth or tongue- go to the ER

## 2023-02-08 NOTE — Progress Notes (Signed)
Subjective:    Patient ID: Mary Richards, female    DOB: 18-Jul-1986, 37 y.o.   MRN: [id number]  HPI 37 yo pt of NP Arnett presents with hives  Gets hives about once per year   Wt Readings from Last 3 Encounters:  02/08/23 198 lb (89.8 kg)  01/27/23 200 lb 3.2 oz (90.8 kg)  11/30/22 200 lb (90.7 kg)   31.01 kg/m  Vitals:   02/08/23 1000  BP: 124/78  Pulse: 88  Temp: 97.7 F (36.5 C)  SpO2: 99%    3 days of hives Come and go  Not sleeping well  Making her nauseated   Worst in between legs   Has h/o intermittent hives   No recent antibiotic   No known trigger  Had hives off/on as a kid in response to virus   Has had allergy testing for antibiotics in the past  Was found to NOT be allergic to penicillin   No other allergy testing  Symptoms of seasonal allergies-assumes she has   Was on prednisone for a cough recently (e visit 01/17/23)  Got hives when she finished it   Cold compress helps a bit     Did virt visit  Suggested claritin and pepcid = was already on zyrtec so skipped claritin  Benadryl at night (helps just a little bit)    Takes zyrtec 10 mg D twice daily   No mouth or tongue swelling   Patient Active Problem List   Diagnosis Date Noted   Urticaria 02/08/2023   Back pain 01/27/2023   Intractable migraine 09/02/2022   Dysuria 07/28/2022   History of urinary stone 04/24/2022   Irritable bowel syndrome without diarrhea 04/24/2022   Irritant contact dermatitis, unspecified cause 04/24/2022   Asthma during pregnancy 05/28/2021   Routine physical examination 01/11/2021   Bilateral impacted cerumen 06/05/2020   Obesity (BMI 30-39.9) 04/29/2020   GAD (generalized anxiety disorder) 07/08/2019   Chronic low back pain 07/08/2019   Past Medical History:  Diagnosis Date   Asthma    Chronic migraine    Complication of anesthesia    DDD (degenerative disc disease), lumbosacral    Family history of adverse reaction to anesthesia     Mother and Father - PONV   PONV (postoperative nausea and vomiting)    Ruptured left tubal ectopic pregnancy causing hemoperitoneum 01/23/2021   Wears contact lenses    Past Surgical History:  Procedure Laterality Date   CHOLECYSTECTOMY     COLONOSCOPY WITH PROPOFOL N/A 02/19/2021   Procedure: COLONOSCOPY WITH PROPOFOL;  Surgeon: Jonathon Bellows, MD;  Location: [facility] ENDOSCOPY;  Service: Gastroenterology;  Laterality: N/A;   DIAGNOSTIC LAPAROSCOPY WITH REMOVAL OF ECTOPIC PREGNANCY Left 01/23/2021   Procedure: DIAGNOSTIC LAPAROSCOPY WITH REMOVAL OF ECTOPIC PREGNANCY;  Surgeon: Will Bonnet, MD;  Location: [facility] ORS;  Service: Gynecology;  Laterality: Left;   PILONIDAL CYST EXCISION     TONSILECTOMY, ADENOIDECTOMY, BILATERAL MYRINGOTOMY AND TUBES     TYMPANOPLASTY Right 05/10/2019   Procedure: TYMPANOPLASTY;  Surgeon: Beverly Gust, MD;  Location: [facility];  Service: ENT;  Laterality: Right;   Social History   Tobacco Use   Smoking status: Never   Smokeless tobacco: Never  Vaping Use   Vaping Use: Never used  Substance Use Topics   Alcohol use: Yes    Alcohol/week: 2.0 standard drinks of alcohol    Types: 2 Standard drinks or equivalent per week    Comment: Social    Drug use: Never  Family History  Problem Relation Age of Onset   Irritable bowel syndrome Mother    Brain cancer Father 58       Glioblastoma, died one year after dx   Valvular heart disease Sister    COPD Maternal Grandfather    Brain cancer Paternal Grandmother 22       unsure what type, she was not treated   Thyroid cancer Neg Hx    Colon cancer Neg Hx    Breast cancer Neg Hx    Allergies  Allergen Reactions   Azithromycin Hives, Shortness Of Breath and Swelling    Throat swelling   Sulfa Antibiotics Hives   Ciprofloxacin Swelling   Moxifloxacin Hcl Hives   Bactrim [Sulfamethoxazole-Trimethoprim] Rash   Wound Dressing Adhesive Rash    Dermabond   Current Outpatient Medications on File  Prior to Visit  Medication Sig Dispense Refill   albuterol (VENTOLIN HFA) 108 (90 Base) MCG/ACT inhaler INHALE 2 PUFFS INTO THE LUNGS EVERY 6 HOURS AS NEEDED FOR WHEEZING OR SHORTNESS OF BREATH. 6.7 g 1   azelastine (ASTELIN) 0.1 % nasal spray Place 1 spray into both nostrils 2 (two) times daily. Use in each nostril as directed 30 mL 3   cetirizine (ZYRTEC) 10 MG tablet Take 10 mg by mouth daily.     cholestyramine (QUESTRAN) 4 g packet Take 1 packet (4 g total) by mouth 2 (two) times daily. 60 each 11   famotidine (PEPCID) 20 MG tablet Take 1 tablet (20 mg total) by mouth 2 (two) times daily. 60 tablet 0   fluticasone (FLONASE) 50 MCG/ACT nasal spray Place 2 sprays into both nostrils daily. 16 g 0   meloxicam (MOBIC) 15 MG tablet Take 1 tablet (15 mg total) by mouth daily as needed for pain. 30 tablet 1   phentermine (ADIPEX-P) 37.5 MG tablet Take 1 tablet (37.5 mg total) by mouth daily before breakfast. 30 tablet 2   No current facility-administered medications on file prior to visit.    Review of Systems  Constitutional:  Positive for fatigue. Negative for activity change, appetite change, fever and unexpected weight change.  HENT:  Negative for congestion, ear pain, rhinorrhea, sinus pressure and sore throat.   Eyes:  Negative for pain, redness and visual disturbance.  Respiratory:  Positive for cough. Negative for shortness of breath and wheezing.   Cardiovascular:  Negative for chest pain and palpitations.  Gastrointestinal:  Negative for abdominal pain, blood in stool, constipation and diarrhea.  Endocrine: Negative for polydipsia and polyuria.  Genitourinary:  Negative for dysuria, frequency and urgency.  Musculoskeletal:  Negative for arthralgias, back pain and myalgias.  Skin:  Positive for rash. Negative for pallor.  Allergic/Immunologic: Negative for environmental allergies.  Neurological:  Negative for dizziness, syncope and headaches.  Hematological:  Negative for adenopathy.  Does not bruise/bleed easily.  Psychiatric/Behavioral:  Negative for decreased concentration and dysphoric mood. The patient is not nervous/anxious.        Objective:   Physical Exam Constitutional:      General: She is not in acute distress.    Appearance: Normal appearance. She is obese. She is not ill-appearing or diaphoretic.  HENT:     Head: Normocephalic and atraumatic.     Right Ear: Tympanic membrane and ear canal normal.     Left Ear: Tympanic membrane and ear canal normal.     Nose: Nose normal.     Comments: Boggy nares     Mouth/Throat:     Mouth: Mucous  membranes are moist.     Pharynx: Oropharynx is clear.     Comments: No mouth or throat swelling Eyes:     General:        Right eye: No discharge.        Left eye: No discharge.     Conjunctiva/sclera: Conjunctivae normal.     Pupils: Pupils are equal, round, and reactive to light.  Cardiovascular:     Rate and Rhythm: Regular rhythm.     Heart sounds: Normal heart sounds.  Pulmonary:     Effort: No respiratory distress.     Breath sounds: No stridor. No wheezing, rhonchi or rales.  Abdominal:     General: There is no distension.     Palpations: Abdomen is soft.     Tenderness: There is no abdominal tenderness.  Lymphadenopathy:     Cervical: No cervical adenopathy.  Skin:    Coloration: Skin is not jaundiced.     Findings: Erythema and rash present. No bruising.     Comments: Areas of urticaria over waist and arms and lower abdomen  Some confluence  No open areas   None on palms or face    Neurological:     Mental Status: She is alert.     Cranial Nerves: No cranial nerve deficit.     Motor: No weakness.  Psychiatric:        Mood and Affect: Mood normal.           Assessment & Plan:   Problem List Items Addressed This Visit       Musculoskeletal and Integument   Urticaria - Primary    Recurrent problem for this pt  Chart reviewed  Past triggers include allergen / medication and  viruses   This may have been triggered by uri  Came back when she finished prednisone for bronchitis Not relieved by antihistamines and pepcid and cold compresses Today px prednisone 60 mg taper (disc side eff) May eventually need allergy referral Recommend f/u with pcp  Recommend avoid products with fragrance /color/detergent Disc ER precautions  Watch for s/s of anaphylaxis

## 2023-02-08 NOTE — Assessment & Plan Note (Signed)
Recurrent problem for this pt  Chart reviewed  Past triggers include allergen / medication and viruses   This may have been triggered by uri  Came back when she finished prednisone for bronchitis Not relieved by antihistamines and pepcid and cold compresses Today px prednisone 60 mg taper (disc side eff) May eventually need allergy referral Recommend f/u with pcp  Recommend avoid products with fragrance /color/detergent Disc ER precautions  Watch for s/s of anaphylaxis

## 2023-02-16 ENCOUNTER — Telehealth (INDEPENDENT_AMBULATORY_CARE_PROVIDER_SITE_OTHER): Payer: 59 | Admitting: Internal Medicine

## 2023-02-16 ENCOUNTER — Other Ambulatory Visit: Payer: Self-pay

## 2023-02-16 ENCOUNTER — Encounter: Payer: Self-pay | Admitting: Internal Medicine

## 2023-02-16 DIAGNOSIS — R053 Chronic cough: Secondary | ICD-10-CM | POA: Diagnosis not present

## 2023-02-16 MED ORDER — AMOXICILLIN-POT CLAVULANATE 875-125 MG PO TABS
1.0000 | ORAL_TABLET | Freq: Two times a day (BID) | ORAL | 0 refills | Status: DC
  Filled 2023-02-16: qty 14, 7d supply, fill #0

## 2023-02-16 MED ORDER — BENZONATATE 200 MG PO CAPS
200.0000 mg | ORAL_CAPSULE | Freq: Three times a day (TID) | ORAL | 0 refills | Status: DC | PRN
  Filled 2023-02-16: qty 60, 20d supply, fill #0

## 2023-02-16 NOTE — Progress Notes (Signed)
Subjective:    Patient ID: Mary Richards, female    DOB: [birth year], 37 y.o.   MRN: [id number]  HPI Video virtual visit due to cough Identification done Reviewed limitations and billing and she gave consent Participants--patient in her work and I am in my office  Has had cough for over 4 weeks Wakes her up at night Was on prednisone for a while and had gotten antibiotic from urgent care Prednisone helped the hives but not the cough  On flonase, zyrtec, xyzal---not really helping Will be okay for a while--then have bad spell of cough Productive of sputum---mucinex makes it worse  Benzonatate helps briefly Feels tight but not really SOB Headache from the coughing spells Starts dry--then junky (brown to clear sputum) No fever  Asthma only significant since after COVID in 2020 Using albuterol AM and bedtime ---occasionally prn  Current Outpatient Medications on File Prior to Visit  Medication Sig Dispense Refill   albuterol (VENTOLIN HFA) 108 (90 Base) MCG/ACT inhaler INHALE 2 PUFFS INTO THE LUNGS EVERY 6 HOURS AS NEEDED FOR WHEEZING OR SHORTNESS OF BREATH. 6.7 g 1   azelastine (ASTELIN) 0.1 % nasal spray Place 1 spray into both nostrils 2 (two) times daily. Use in each nostril as directed 30 mL 3   cetirizine (ZYRTEC) 10 MG tablet Take 10 mg by mouth daily.     cholestyramine (QUESTRAN) 4 g packet Take 1 packet (4 g total) by mouth 2 (two) times daily. 60 each 11   famotidine (PEPCID) 20 MG tablet Take 1 tablet (20 mg total) by mouth 2 (two) times daily. 60 tablet 0   fluticasone (FLONASE) 50 MCG/ACT nasal spray Place 2 sprays into both nostrils daily. 16 g 0   meloxicam (MOBIC) 15 MG tablet Take 1 tablet (15 mg total) by mouth daily as needed for pain. 30 tablet 1   phentermine (ADIPEX-P) 37.5 MG tablet Take 1 tablet (37.5 mg total) by mouth daily before breakfast. 30 tablet 2   No current facility-administered medications on file prior to visit.    Allergies   Allergen Reactions   Azithromycin Hives, Shortness Of Breath and Swelling    Throat swelling   Sulfa Antibiotics Hives   Ciprofloxacin Swelling   Moxifloxacin Hcl Hives   Bactrim [Sulfamethoxazole-Trimethoprim] Rash   Wound Dressing Adhesive Rash    Dermabond    Past Medical History:  Diagnosis Date   Asthma    Chronic migraine    Complication of anesthesia    DDD (degenerative disc disease), lumbosacral    Family history of adverse reaction to anesthesia    Mother and Father - PONV   PONV (postoperative nausea and vomiting)    Ruptured left tubal ectopic pregnancy causing hemoperitoneum 01/23/2021   Wears contact lenses     Past Surgical History:  Procedure Laterality Date   CHOLECYSTECTOMY     COLONOSCOPY WITH PROPOFOL N/A 02/19/2021   Procedure: COLONOSCOPY WITH PROPOFOL;  Surgeon: Jonathon Bellows, MD;  Location: [facility];  Service: Gastroenterology;  Laterality: N/A;   DIAGNOSTIC LAPAROSCOPY WITH REMOVAL OF ECTOPIC PREGNANCY Left 01/23/2021   Procedure: DIAGNOSTIC LAPAROSCOPY WITH REMOVAL OF ECTOPIC PREGNANCY;  Surgeon: Will Bonnet, MD;  Location: [facility] ORS;  Service: Gynecology;  Laterality: Left;   PILONIDAL CYST EXCISION     TONSILECTOMY, ADENOIDECTOMY, BILATERAL MYRINGOTOMY AND TUBES     TYMPANOPLASTY Right 05/10/2019   Procedure: TYMPANOPLASTY;  Surgeon: Beverly Gust, MD;  Location: [place];  Service: ENT;  Laterality: Right;  Family History  Problem Relation Age of Onset   Irritable bowel syndrome Mother    Brain cancer Father 91       Glioblastoma, died one year after dx   Valvular heart disease Sister    COPD Maternal Grandfather    Brain cancer Paternal Grandmother 66       unsure what type, she was not treated   Thyroid cancer Neg Hx    Colon cancer Neg Hx    Breast cancer Neg Hx     Social History   Socioeconomic History   Marital status: Married    Spouse name: Administrator   Number of children: Not on file   Years of  education: Not on file   Highest education level: Not on file  Occupational History   Not on file  Tobacco Use   Smoking status: Never   Smokeless tobacco: Never  Vaping Use   Vaping Use: Never used  Substance and Sexual Activity   Alcohol use: Yes    Alcohol/week: 2.0 standard drinks of alcohol    Types: 2 Standard drinks or equivalent per week    Comment: Social    Drug use: Never   Sexual activity: Yes    Birth control/protection: Condom    Comment: Unknown  Other Topics Concern   Not on file  Social History Narrative   Works as Designer, multimedia in same day surgery.    2 children   39 year old ( boy)   56 month old ( boy)   Social Determinants of Radio broadcast assistant Strain: Not on file  Food Insecurity: Not on file  Transportation Needs: Not on file  Physical Activity: Insufficiently Active (02/08/2023)   Exercise Vital Sign    Days of Exercise per Week: 2 days    Minutes of Exercise per Session: 40 min  Stress: Stress Concern Present (02/08/2023)   [place]    Feeling of Stress : To some extent  Social Connections: Moderately Isolated (02/08/2023)   Social Connection and Isolation Panel [NHANES]    Frequency of Communication with Friends and Family: More than three times a week    Frequency of Social Gatherings with Friends and Family: Never    Attends Religious Services: Never    Printmaker: No    Attends Music therapist: Not on file    Marital Status: Married  Human resources officer Violence: Not on file   Review of Systems Has stomach bug last week--no N/V Eating again now Has been back to work since done with the GI bug    Objective:   Physical Exam Constitutional:      Appearance: Normal appearance.  Pulmonary:     Effort: Pulmonary effort is normal. No respiratory distress.  Neurological:     Mental Status: She is alert.            Assessment &  Plan:

## 2023-02-16 NOTE — Assessment & Plan Note (Signed)
Has symptoms of sinus drainage----amoxil did not help Clearly has an allergy/asthma component--but prednisone didn't really help Will try augmentin 875 bid x 7 days Benzonatate helps but wears off---will refill at higher dose (200mg ) Continue allergy regimen---consider adding montelukast if ongoing problems

## 2023-02-22 ENCOUNTER — Telehealth: Payer: Self-pay | Admitting: Family

## 2023-02-22 ENCOUNTER — Other Ambulatory Visit: Payer: Self-pay

## 2023-02-22 ENCOUNTER — Encounter: Payer: Self-pay | Admitting: Family

## 2023-02-22 ENCOUNTER — Ambulatory Visit
Admission: RE | Admit: 2023-02-22 | Discharge: 2023-02-22 | Disposition: A | Discharge status: Home / Self Care | Payer: 59 | Source: Ambulatory Visit | Attending: Family | Admitting: Family

## 2023-02-22 ENCOUNTER — Other Ambulatory Visit
Admission: RE | Admit: 2023-02-22 | Discharge: 2023-02-22 | Disposition: A | Discharge status: Home / Self Care | Payer: 59 | Source: Ambulatory Visit | Attending: Family | Admitting: Family

## 2023-02-22 ENCOUNTER — Ambulatory Visit (INDEPENDENT_AMBULATORY_CARE_PROVIDER_SITE_OTHER): Payer: 59 | Admitting: Family

## 2023-02-22 VITALS — BP 128/76 | HR 100 | Temp 98.2°F | Ht 67.0 in | Wt 191.6 lb

## 2023-02-22 DIAGNOSIS — R053 Chronic cough: Secondary | ICD-10-CM

## 2023-02-22 DIAGNOSIS — J189 Pneumonia, unspecified organism: Secondary | ICD-10-CM | POA: Diagnosis not present

## 2023-02-22 DIAGNOSIS — R059 Cough, unspecified: Secondary | ICD-10-CM | POA: Diagnosis not present

## 2023-02-22 LAB — CBC WITH DIFFERENTIAL/PLATELET
Abs Immature Granulocytes: 0.12 10*3/uL — ABNORMAL HIGH (ref 0.00–0.07)
Basophils Absolute: 0.1 10*3/uL (ref 0.0–0.1)
Basophils Relative: 1 %
Eosinophils Absolute: 0.5 10*3/uL (ref 0.0–0.5)
Eosinophils Relative: 4 %
HCT: 44.3 % (ref 36.0–46.0)
Hemoglobin: 14.3 g/dL (ref 12.0–15.0)
Immature Granulocytes: 1 %
Lymphocytes Relative: 32 %
Lymphs Abs: 4.1 10*3/uL — ABNORMAL HIGH (ref 0.7–4.0)
MCH: 29.1 pg (ref 26.0–34.0)
MCHC: 32.3 g/dL (ref 30.0–36.0)
MCV: 90.2 fL (ref 80.0–100.0)
Monocytes Absolute: 1 10*3/uL (ref 0.1–1.0)
Monocytes Relative: 7 %
Neutro Abs: 7 10*3/uL (ref 1.7–7.7)
Neutrophils Relative %: 55 %
Platelets: 371 10*3/uL (ref 150–400)
RBC: 4.91 MIL/uL (ref 3.87–5.11)
RDW: 13.2 % (ref 11.5–15.5)
WBC: 12.8 10*3/uL — ABNORMAL HIGH (ref 4.0–10.5)
nRBC: 0 % (ref 0.0–0.2)

## 2023-02-22 MED ORDER — BUDESONIDE-FORMOTEROL FUMARATE 80-4.5 MCG/ACT IN AERO
2.0000 | INHALATION_SPRAY | Freq: Two times a day (BID) | RESPIRATORY_TRACT | 3 refills | Status: DC
  Filled 2023-02-22: qty 10.2, 30d supply, fill #0

## 2023-02-22 MED ORDER — DOXYCYCLINE HYCLATE 100 MG PO TABS
100.0000 mg | ORAL_TABLET | Freq: Two times a day (BID) | ORAL | 0 refills | Status: AC
  Filled 2023-02-22: qty 14, 7d supply, fill #0

## 2023-02-22 NOTE — Progress Notes (Signed)
Assessment & Plan:  Persistent cough Assessment & Plan: Question if RAD. Pending trial of Symbicort, CXR. Advised to start pepcid ac to ensure GERD is not aggravating. Eosinophils are not elevated. Referral to allergy. Consider singulair however we discussed today and patient reluctant due to h/o depression. Close follow up.   Orders: -     DG Chest 2 View; Future -     CBC with Differential/Platelet; Future -     Budesonide-Formoterol Fumarate; Inhale 2 puffs into the lungs 2 (two) times daily.  Dispense: 10.2 g; Refill: 3 -     Ambulatory referral to Allergy     Return precautions given.   Risks, benefits, and alternatives of the medications and treatment plan prescribed today were discussed, and patient expressed understanding.   Education regarding symptom management and diagnosis given to patient on AVS either electronically or printed.  Return in about 6 weeks (around 04/05/2023).  [name], FNP  Subjective:    Patient ID: Mary Richards, female    DOB: Aug 28, 1986, 37 y.o.   MRN: [id number]  CC: Mary Richards is a 37 y.o. female who presents today for an acute visit.    HPI: Complains of coughing, increased wheezing x 2 months.  She is compliant with azelastine She is having cough spell at night.  Uses albuterol inhaler 4-6 times per day with temportaily relief.  No fever , ear pain, sinus pain, nasal congestion,leg swelling, sob.   She is not taking pepcid   Compliant with flonase, zyrtec. She is not taking azelastine.      Seen virtually, Dr. Alphonsus Sias and prescribed Augmentin 6 days ago with improvement of facial pain. No improvement of cough, wheezing.  Given prednisone 01/10/2023 and 02/08/23 Never smoker Treated for uticaria 02/08/23  Allergies: Azithromycin, Sulfa antibiotics, Ciprofloxacin, Moxifloxacin hcl, Bactrim [sulfamethoxazole-trimethoprim], and Wound dressing adhesive Current Outpatient Medications on File Prior to Visit   Medication Sig Dispense Refill   albuterol (VENTOLIN HFA) 108 (90 Base) MCG/ACT inhaler INHALE 2 PUFFS INTO THE LUNGS EVERY 6 HOURS AS NEEDED FOR WHEEZING OR SHORTNESS OF BREATH. 6.7 g 1   amoxicillin-clavulanate (AUGMENTIN) 875-125 MG tablet Take 1 tablet by mouth 2 (two) times daily. 14 tablet 0   azelastine (ASTELIN) 0.1 % nasal spray Place 1 spray into both nostrils 2 (two) times daily. Use in each nostril as directed 30 mL 3   benzonatate (TESSALON) 200 MG capsule Take 1 capsule (200 mg total) by mouth 3 (three) times daily as needed for cough. 60 capsule 0   cetirizine (ZYRTEC) 10 MG tablet Take 10 mg by mouth daily.     cholestyramine (QUESTRAN) 4 g packet Take 1 packet (4 g total) by mouth 2 (two) times daily. 60 each 11   famotidine (PEPCID) 20 MG tablet Take 1 tablet (20 mg total) by mouth 2 (two) times daily. 60 tablet 0   fluticasone (FLONASE) 50 MCG/ACT nasal spray Place 2 sprays into both nostrils daily. 16 g 0   meloxicam (MOBIC) 15 MG tablet Take 1 tablet (15 mg total) by mouth daily as needed for pain. 30 tablet 1   phentermine (ADIPEX-P) 37.5 MG tablet Take 1 tablet (37.5 mg total) by mouth daily before breakfast. 30 tablet 2   No current facility-administered medications on file prior to visit.    Review of Systems  Constitutional:  Negative for chills and fever.  HENT:  Negative for congestion and sinus pressure.   Respiratory:  Positive for cough and wheezing.  Cardiovascular:  Negative for chest pain and palpitations.  Gastrointestinal:  Negative for nausea and vomiting.      Objective:    BP 128/76   Pulse 100   Temp 98.2 F (36.8 C) (Oral)   Ht 5\' 7"  (1.702 m)   Wt 191 lb 9.6 oz (86.9 kg)   LMP 01/24/2023   SpO2 97%   BMI 30.01 kg/m   BP Readings from Last 3 Encounters:  02/22/23 128/76  02/08/23 124/78  11/30/22 132/78   Wt Readings from Last 3 Encounters:  02/22/23 191 lb 9.6 oz (86.9 kg)  02/08/23 198 lb (89.8 kg)  01/27/23 200 lb 3.2 oz (90.8  kg)    Physical Exam Vitals reviewed.  Constitutional:      Appearance: She is well-developed.  HENT:     Head: Normocephalic and atraumatic.     Right Ear: Hearing, tympanic membrane, ear canal and external ear normal. No decreased hearing noted. No drainage, swelling or tenderness. No middle ear effusion. No foreign body. Tympanic membrane is not erythematous or bulging.     Left Ear: Hearing, tympanic membrane, ear canal and external ear normal. No decreased hearing noted. No drainage, swelling or tenderness.  No middle ear effusion. No foreign body. Tympanic membrane is not erythematous or bulging.     Nose: Nose normal. No rhinorrhea.     Right Sinus: No maxillary sinus tenderness or frontal sinus tenderness.     Left Sinus: No maxillary sinus tenderness or frontal sinus tenderness.     Mouth/Throat:     Pharynx: Uvula midline. No oropharyngeal exudate or posterior oropharyngeal erythema.     Tonsils: No tonsillar abscesses.  Eyes:     Conjunctiva/sclera: Conjunctivae normal.  Cardiovascular:     Rate and Rhythm: Regular rhythm.     Pulses: Normal pulses.     Heart sounds: Normal heart sounds.  Pulmonary:     Effort: Pulmonary effort is normal.     Breath sounds: Normal breath sounds. No wheezing, rhonchi or rales.  Lymphadenopathy:     Head:     Right side of head: No submental, submandibular, tonsillar, preauricular, posterior auricular or occipital adenopathy.     Left side of head: No submental, submandibular, tonsillar, preauricular, posterior auricular or occipital adenopathy.     Cervical: No cervical adenopathy.  Skin:    General: Skin is warm and dry.  Neurological:     Mental Status: She is alert.  Psychiatric:        Speech: Speech normal.        Behavior: Behavior normal.        Thought Content: Thought content normal.

## 2023-02-22 NOTE — Telephone Encounter (Signed)
Spoke with patient regards to missing multifocal PNA on chest x-ray.  Advised that she would not need to start Symbicort as I suspected this was not asthma and her cough is more related to infection.  She has completed Augmentin and I advised her since she has a severe allergy to azithromycin, we will start doxycycline.  She is taken doxycycline in the past without allergic reaction.  Advised probiotics.  She has follow-up with me in May we will repeat chest x-ray at that time

## 2023-02-22 NOTE — Assessment & Plan Note (Addendum)
Question if RAD. Pending trial of Symbicort, CXR. Advised to start pepcid ac to ensure GERD is not aggravating. Eosinophils are not elevated. Referral to allergy. Consider singulair however we discussed today and patient reluctant due to h/o depression. Close follow up.

## 2023-02-22 NOTE — Telephone Encounter (Signed)
Spoke to pt and scheduled her in office appt for 02/22/23

## 2023-02-22 NOTE — Patient Instructions (Signed)
Resume Pepcid AC 20 mg at bedtime to ensure acid reflux is not playing a role in cough Start Symbicort daily as a preventative maintenance medication for asthma.  Please continue albuterol.  You may consider Singulair if you would like in the future.    I placed a referral to [facility].  Let us know if you dont hear back within a week in regards to an appointment being scheduled.   So that you are aware, if you are [facility] MyChart user , please pay attention to your MyChart messages as you may receive a MyChart message with a phone number to call and schedule this test/appointment own your own from our referral coordinator. This is a new process so I do not want you to miss this message.  If you are not a MyChart user, you will receive a phone call.

## 2023-02-24 ENCOUNTER — Encounter: Payer: Self-pay | Admitting: Family

## 2023-02-24 ENCOUNTER — Telehealth: Payer: Self-pay | Admitting: Family

## 2023-02-24 ENCOUNTER — Other Ambulatory Visit: Payer: Self-pay | Admitting: Family

## 2023-02-24 DIAGNOSIS — J189 Pneumonia, unspecified organism: Secondary | ICD-10-CM

## 2023-02-24 DIAGNOSIS — R899 Unspecified abnormal finding in specimens from other organs, systems and tissues: Secondary | ICD-10-CM

## 2023-02-24 MED ORDER — ALBUTEROL SULFATE (2.5 MG/3ML) 0.083% IN NEBU: 2.5000 mg | INHALATION_SOLUTION | Freq: Four times a day (QID) | RESPIRATORY_TRACT | 2 refills | Status: AC | PRN

## 2023-02-24 NOTE — Telephone Encounter (Signed)
Spoke to pt and she is not feeling any better and she refuses to go to ED but would like to know if you can prescribe something else

## 2023-02-24 NOTE — Telephone Encounter (Signed)
Patient was seen o 02/22/23. She would like to know if it is normal to feel more chest congestion, and more short of breathe. Patient was put on antibiotics.

## 2023-02-24 NOTE — Telephone Encounter (Signed)
Spoke with pt   Compliant mucinex, albuterol once today,  2 day of doxycycline. She is compliant probiotic.   Cough and congestion are worse at night.  She describes feeling short of breath when coughing due to normal sinus congestion she has.  She is not short of breath today.  She used albuterol 4 times last night with temporarily relief.     No cp, fever.   Plan We discussed my concern with failure to improve.  I advised her to go to urgent care for in person evaluation and a repeat chest x-ray.  She declines as doesn't feel  that is necessary at this time Discussed patient extensive antibiotic allergies.  We considered changing from doxycycline to Levaquin however she is allergic to ciprofloxacin and moxifloxacin. Provided her with albuterol for nebulizer machine at home. She will continue doxycycline Mucinex and albuterol.  If no improvement today and if this evening she is not feeling better, advised her to report to nearest emergency room.  She verbalized understanding and agrees to go to [place] ED .

## 2023-02-25 ENCOUNTER — Other Ambulatory Visit: Payer: Self-pay | Admitting: Family

## 2023-02-25 ENCOUNTER — Encounter: Payer: Self-pay | Admitting: Family

## 2023-02-25 DIAGNOSIS — J189 Pneumonia, unspecified organism: Secondary | ICD-10-CM

## 2023-02-25 MED ORDER — CEFPODOXIME PROXETIL 200 MG PO TABS: 200.0000 mg | ORAL_TABLET | Freq: Two times a day (BID) | ORAL | 0 refills | Status: AC

## 2023-02-25 NOTE — Progress Notes (Signed)
close

## 2023-02-27 ENCOUNTER — Other Ambulatory Visit: Payer: Self-pay

## 2023-02-27 ENCOUNTER — Encounter: Payer: Self-pay | Admitting: Family

## 2023-02-27 ENCOUNTER — Telehealth: Payer: Self-pay | Admitting: Family

## 2023-02-27 NOTE — Telephone Encounter (Signed)
Called patient and spoke with her from home after leaving her voicemail for my [employer] app.  Also sent her MyChart message.  patient is not feeling worse.  Denies shortness of breath.  She used her nebulizer once last night.  She feels stable at home.  We agreed she might perhaps be starting to feeling better on doxycycline.  Monitor clinically.  I have sent in Vantin as she is previously taken Keflex without any side effect.  She will let me know how she is doing.  Advised continue probiotics

## 2023-02-28 ENCOUNTER — Other Ambulatory Visit: Payer: Self-pay

## 2023-03-01 DIAGNOSIS — R079 Chest pain, unspecified: Secondary | ICD-10-CM | POA: Diagnosis not present

## 2023-03-01 DIAGNOSIS — R059 Cough, unspecified: Secondary | ICD-10-CM | POA: Diagnosis not present

## 2023-03-01 DIAGNOSIS — J209 Acute bronchitis, unspecified: Secondary | ICD-10-CM | POA: Diagnosis not present

## 2023-03-01 DIAGNOSIS — R9431 Abnormal electrocardiogram [ECG] [EKG]: Secondary | ICD-10-CM | POA: Diagnosis not present

## 2023-03-01 DIAGNOSIS — Z79899 Other long term (current) drug therapy: Secondary | ICD-10-CM | POA: Diagnosis not present

## 2023-03-01 DIAGNOSIS — J40 Bronchitis, not specified as acute or chronic: Secondary | ICD-10-CM | POA: Diagnosis not present

## 2023-03-01 DIAGNOSIS — Z20822 Contact with and (suspected) exposure to covid-19: Secondary | ICD-10-CM | POA: Diagnosis not present

## 2023-03-01 DIAGNOSIS — R0602 Shortness of breath: Secondary | ICD-10-CM | POA: Diagnosis not present

## 2023-03-02 ENCOUNTER — Telehealth: Payer: Self-pay

## 2023-03-02 NOTE — Transitions of Care (Post Inpatient/ED Visit) (Signed)
   03/02/2023  Name: Mary Richards MRN: [id number] DOB: [birth year]  Today's TOC FU Call Status: Today's TOC FU Call Status:: Unsuccessul Call (1st Attempt) Unsuccessful Call (1st Attempt) Date: 03/02/23  Attempted to reach the patient regarding the most recent Inpatient/ED visit.  Follow Up Plan: Additional outreach attempts will be made to reach the patient to complete the Transitions of Care (Post Inpatient/ED visit) call.   Signature Donnamarie Poag, CMA

## 2023-03-02 NOTE — Transitions of Care (Post Inpatient/ED Visit) (Signed)
    03/02/2023  Name: Mary Richards MRN: [id number] DOB: [birth year]  Today's TOC FU Call Status: Today's TOC FU Call Status:: Successful TOC FU Call Competed Unsuccessful Call (1st Attempt) Date: 03/02/23 [facility] FU Call Complete Date: 03/02/23  Transition Care Management Follow-up Telephone Call Date of Discharge: 03/02/23 Discharge Facility:  [facility]) Type of Discharge: Emergency Department Reason for ED Visit: Respiratory Respiratory Diagnosis: Pnuemonia How have you been since you were released from the hospital?: Same Any questions or concerns?: No  Items Reviewed: Did you receive and understand the discharge instructions provided?: No Medications obtained and verified?: Yes (Medications Reviewed) Any new allergies since your discharge?: No Dietary orders reviewed?: Yes Type of Diet Ordered:: lots of fluids Do you have support at home?: Yes People in Home: spouse Name of Support/Comfort Primary Source: shane  Home Care and Equipment/Supplies: Were Home Health Services Ordered?: NA Any new equipment or medical supplies ordered?: NA  Functional Questionnaire: Do you need assistance with bathing/showering or dressing?: No Do you need assistance with meal preparation?: No Do you need assistance with eating?: No Do you have difficulty maintaining continence: No Do you need assistance with getting out of bed/getting out of a chair/moving?: No Do you have difficulty managing or taking your medications?: No  Follow up appointments reviewed: PCP Follow-up appointment confirmed?: Yes Date of PCP follow-up appointment?: 03/06/23 Follow-up Provider: [facility] Follow-up appointment confirmed?: NA Do you need transportation to your follow-up appointment?: No Do you understand care options if your condition(s) worsen?: Yes-patient verbalized understanding    SIGNATURE Donnamarie Poag, CMA

## 2023-03-03 NOTE — Telephone Encounter (Signed)
Spoke to pt and asked her if she could bring in paperwork from visit to [facility] on 03/06/23  in office with her

## 2023-03-06 ENCOUNTER — Encounter: Payer: Self-pay | Admitting: Family

## 2023-03-06 ENCOUNTER — Ambulatory Visit (INDEPENDENT_AMBULATORY_CARE_PROVIDER_SITE_OTHER): Payer: 59 | Admitting: Family

## 2023-03-06 VITALS — BP 120/78 | HR 80 | Temp 98.3°F | Ht 67.0 in | Wt 191.0 lb

## 2023-03-06 DIAGNOSIS — R899 Unspecified abnormal finding in specimens from other organs, systems and tissues: Secondary | ICD-10-CM

## 2023-03-06 DIAGNOSIS — J189 Pneumonia, unspecified organism: Secondary | ICD-10-CM | POA: Diagnosis not present

## 2023-03-06 LAB — CBC WITH DIFFERENTIAL/PLATELET
Basophils Absolute: 0 10*3/uL (ref 0.0–0.1)
Basophils Relative: 0.6 % (ref 0.0–3.0)
Eosinophils Absolute: 0.4 10*3/uL (ref 0.0–0.7)
Eosinophils Relative: 7 % — ABNORMAL HIGH (ref 0.0–5.0)
HCT: 40.7 % (ref 36.0–46.0)
Hemoglobin: 13.6 g/dL (ref 12.0–15.0)
Lymphocytes Relative: 45.1 % (ref 12.0–46.0)
Lymphs Abs: 2.9 10*3/uL (ref 0.7–4.0)
MCHC: 33.4 g/dL (ref 30.0–36.0)
MCV: 87.9 fl (ref 78.0–100.0)
Monocytes Absolute: 0.7 10*3/uL (ref 0.1–1.0)
Monocytes Relative: 10.9 % (ref 3.0–12.0)
Neutro Abs: 2.3 10*3/uL (ref 1.4–7.7)
Neutrophils Relative %: 36.4 % — ABNORMAL LOW (ref 43.0–77.0)
Platelets: 324 10*3/uL (ref 150.0–400.0)
RBC: 4.63 Mil/uL (ref 3.87–5.11)
RDW: 13.7 % (ref 11.5–15.5)
WBC: 6.3 10*3/uL (ref 4.0–10.5)

## 2023-03-06 LAB — COMPREHENSIVE METABOLIC PANEL
ALT: 51 U/L — ABNORMAL HIGH (ref 0–35)
AST: 32 U/L (ref 0–37)
Albumin: 3.9 g/dL (ref 3.5–5.2)
Alkaline Phosphatase: 71 U/L (ref 39–117)
BUN: 7 mg/dL (ref 6–23)
CO2: 31 mEq/L (ref 19–32)
Calcium: 9.4 mg/dL (ref 8.4–10.5)
Chloride: 103 mEq/L (ref 96–112)
Creatinine, Ser: 0.54 mg/dL (ref 0.40–1.20)
GFR: 119.07 mL/min (ref >60.00)
Glucose, Bld: 78 mg/dL (ref 70–99)
Potassium: 4.1 mEq/L (ref 3.5–5.1)
Sodium: 140 mEq/L (ref 135–145)
Total Bilirubin: 0.6 mg/dL (ref 0.2–1.2)
Total Protein: 5.9 g/dL — ABNORMAL LOW (ref 6.0–8.3)

## 2023-03-06 LAB — TSH: TSH: 1.4 u[IU]/mL (ref 0.35–5.50)

## 2023-03-06 LAB — B12 AND FOLATE PANEL
Folate: >23.5 ng/mL (ref >5.9)
Vitamin B-12: 473 pg/mL (ref 211–911)

## 2023-03-06 NOTE — Assessment & Plan Note (Signed)
Reviewed CTA obtained at [facility] ED.  Requested patient to send me this electronically so I scan to the chart.  Patient completed Augmentin and doxycycline.  She did not start cefpodoxime.  Patient overall appears better, diarrhea has resolved.  Discussed at length fatigue lingering after pneumonia, suspected gastroenteritis.  Encouraged her to start a very gentle walking program  5 and 10 minutes a day even a couple times repeated 2-3 times daily  to slowly regain her strength and energy.  Pending thorough lab evaluation for any metabolic etiology for fatigue.  Patient will let me know how she is doing

## 2023-03-06 NOTE — Progress Notes (Signed)
Assessment & Plan:  Pneumonia of both lungs due to infectious organism, unspecified part of lung Assessment & Plan: Reviewed CTA obtained at [facility] ED.  Requested patient to send me this electronically so I scan to the chart.  Patient completed Augmentin and doxycycline.  She did not start cefpodoxime.  Patient overall appears better, diarrhea has resolved.  Discussed at length fatigue lingering after pneumonia, suspected gastroenteritis.  Encouraged her to start a very gentle walking program  5 and 10 minutes a day even a couple times repeated 2-3 times daily  to slowly regain her strength and energy.  Pending thorough lab evaluation for any metabolic etiology for fatigue.  Patient will let me know how she is doing  Orders: -     CBC with Differential/Platelet -     Comprehensive metabolic panel -     TSH -     B12 and Folate Panel  Abnormal laboratory test -     TSH     Return precautions given.   Risks, benefits, and alternatives of the medications and treatment plan prescribed today were discussed, and patient expressed understanding.   Education regarding symptom management and diagnosis given to patient on AVS either electronically or printed.  No follow-ups on file.  [name], FNP  Subjective:    Patient ID: Riham Polyakov, female    DOB: Aug 18, 1986, 37 y.o.   MRN: [id number]  CC: Naimah Yingst is a 37 y.o. female who presents today for follow up.   HPI: ED follow-up.  Patient presented to [facility] emergency room.  03/01/23.  Cough had improved over last weekend and then 3 days later, she felt worse. Congestion worse which provoked ED visit. She had also developed nonbloody liquid diarrhea x 6 days ago, resolved 3 days ago. A/w with nausea.   BM are formed now.  She endorses continued fatigue.  She is sleeping well  No fever, blood in the stool, abdominal pain.   She has been holding phentermine while being sick.    Labs reviewed on patient  phone from ED visit: TSH 0.48, WBC 6.9 Negative Covid, flu.  CTA chest negative PNA, PE.     Unable to see any visit notes in this regard ( patient shows me on her phone).   Chest x-ray 02/22/2023 with multifocal pneumonia.  Patient has completed doxycycline. She never started cefpodoxime.       Allergies: Azithromycin, Sulfa antibiotics, Ciprofloxacin, Moxifloxacin hcl, Bactrim [sulfamethoxazole-trimethoprim], and Wound dressing adhesive Current Outpatient Medications on File Prior to Visit  Medication Sig Dispense Refill   albuterol (PROVENTIL) (2.5 MG/3ML) 0.083% nebulizer solution Take 3 mLs (2.5 mg total) by nebulization every 6 (six) hours as needed for wheezing or shortness of breath. 150 mL 2   albuterol (VENTOLIN HFA) 108 (90 Base) MCG/ACT inhaler INHALE 2 PUFFS INTO THE LUNGS EVERY 6 HOURS AS NEEDED FOR WHEEZING OR SHORTNESS OF BREATH. 6.7 g 1   azelastine (ASTELIN) 0.1 % nasal spray Place 1 spray into both nostrils 2 (two) times daily. Use in each nostril as directed 30 mL 3   cetirizine (ZYRTEC) 10 MG tablet Take 10 mg by mouth daily.     cholestyramine (QUESTRAN) 4 g packet Take 1 packet (4 g total) by mouth 2 (two) times daily. 60 each 11   famotidine (PEPCID) 20 MG tablet Take 1 tablet (20 mg total) by mouth 2 (two) times daily. 60 tablet 0   fluticasone (FLONASE) 50 MCG/ACT nasal spray Place 2 sprays into  both nostrils daily. 16 g 0   meloxicam (MOBIC) 15 MG tablet Take 1 tablet (15 mg total) by mouth daily as needed for pain. 30 tablet 1   phentermine (ADIPEX-P) 37.5 MG tablet Take 1 tablet (37.5 mg total) by mouth daily before breakfast. 30 tablet 2   No current facility-administered medications on file prior to visit.    Review of Systems  Constitutional:  Positive for fatigue. Negative for chills and fever.  Respiratory:  Negative for cough.   Cardiovascular:  Negative for chest pain and palpitations.  Gastrointestinal:  Negative for abdominal distention,  diarrhea, nausea and vomiting.      Objective:    BP 120/78   Pulse 80   Temp 98.3 F (36.8 C) (Oral)   Ht  (1.702 m)   Wt 191 lb (86.6 kg)   LMP  (LMP Unknown)   SpO2 97%   BMI 29.91 kg/m  BP Readings from Last 3 Encounters:  03/06/23 120/78  02/22/23 128/76  02/08/23 124/78   Wt Readings from Last 3 Encounters:  03/06/23 191 lb (86.6 kg)  02/22/23 191 lb 9.6 oz (86.9 kg)  02/08/23 198 lb (89.8 kg)    Physical Exam Vitals reviewed.  Constitutional:      Appearance: She is well-developed.  Eyes:     Conjunctiva/sclera: Conjunctivae normal.  Cardiovascular:     Rate and Rhythm: Normal rate and regular rhythm.     Pulses: Normal pulses.     Heart sounds: Normal heart sounds.  Pulmonary:     Effort: Pulmonary effort is normal.     Breath sounds: Normal breath sounds. No wheezing, rhonchi or rales.  Skin:    General: Skin is warm and dry.  Neurological:     Mental Status: She is alert.  Psychiatric:        Speech: Speech normal.        Behavior: Behavior normal.        Thought Content: Thought content normal.

## 2023-03-07 NOTE — Telephone Encounter (Signed)
Printed and placed on provider desk

## 2023-03-11 ENCOUNTER — Other Ambulatory Visit: Payer: Self-pay | Admitting: Family

## 2023-03-11 DIAGNOSIS — R899 Unspecified abnormal finding in specimens from other organs, systems and tissues: Secondary | ICD-10-CM

## 2023-04-05 ENCOUNTER — Encounter: Payer: Self-pay | Admitting: Family

## 2023-04-05 ENCOUNTER — Telehealth (INDEPENDENT_AMBULATORY_CARE_PROVIDER_SITE_OTHER): Payer: 59 | Admitting: Family

## 2023-04-05 VITALS — Ht 67.0 in | Wt 188.0 lb

## 2023-04-05 DIAGNOSIS — J189 Pneumonia, unspecified organism: Secondary | ICD-10-CM

## 2023-04-05 DIAGNOSIS — R899 Unspecified abnormal finding in specimens from other organs, systems and tissues: Secondary | ICD-10-CM | POA: Diagnosis not present

## 2023-04-05 NOTE — Progress Notes (Signed)
Virtual Visit via Video Note  I connected with Mary Richards on 04/05/23 at  2:00 PM EDT by a video enabled telemedicine application and verified that I am speaking with the correct person using two identifiers. Location patient: home Location provider: work  Persons participating in the virtual visit: patient, provider  I discussed the limitations of evaluation and management by telemedicine and the availability of in person appointments. The patient expressed understanding and agreed to proceed.  HPI:  Feels well today.  No new concerns.  Cough congestion has completely resolved.  ROS: See pertinent positives and negatives per HPI.  EXAM:  VITALS per patient if applicable: Ht 5\' 7"  (1.702 m)   Wt 188 lb (85.3 kg)   LMP  (LMP Unknown)   BMI 29.44 kg/m  BP Readings from Last 3 Encounters:  03/06/23 120/78  02/22/23 128/76  02/08/23 124/78   Wt Readings from Last 3 Encounters:  04/05/23 188 lb (85.3 kg)  03/06/23 191 lb (86.6 kg)  02/22/23 191 lb 9.6 oz (86.9 kg)    GENERAL: alert, oriented, appears well and in no acute distress  HEENT: atraumatic, conjunttiva clear, no obvious abnormalities on inspection of external nose and ears  NECK: normal movements of the head and neck  LUNGS: on inspection no signs of respiratory distress, breathing rate appears normal, no obvious gross SOB, gasping or wheezing  CV: no obvious cyanosis  MS: moves all visible extremities without noticeable abnormality  PSYCH/NEURO: pleasant and cooperative, no obvious depression or anxiety, speech and thought processing grossly intact  ASSESSMENT AND PLAN: Abnormal laboratory test -     Comprehensive metabolic panel; Future -     CBC with Differential/Platelet; Future  Pneumonia of both lungs due to infectious organism, unspecified part of lung Assessment & Plan: Symptoms resolved.  No pneumonia seen on CTA in ED patient shared via MyChart.  Repeat CBC, CMP.  Patient will have done  at work.      -we discussed possible serious and likely etiologies, options for evaluation and workup, limitations of telemedicine visit vs in person visit, treatment, treatment risks and precautions. Pt prefers to treat via telemedicine empirically rather then risking or undertaking an in person visit at this moment.    I discussed the assessment and treatment plan with the patient. The patient was provided an opportunity to ask questions and all were answered. The patient agreed with the plan and demonstrated an understanding of the instructions.   The patient was advised to call back or seek an in-person evaluation if the symptoms worsen or if the condition fails to improve as anticipated.  Advised if desired AVS can be mailed or viewed via MyChart if Mychart user.   [name], FNP

## 2023-04-05 NOTE — Assessment & Plan Note (Signed)
Symptoms resolved.  No pneumonia seen on CTA in ED patient shared via MyChart.  Repeat CBC, CMP.  Patient will have done at work.

## 2023-04-11 ENCOUNTER — Telehealth: Payer: 59 | Admitting: Physician Assistant

## 2023-04-11 ENCOUNTER — Other Ambulatory Visit: Payer: Self-pay

## 2023-04-11 DIAGNOSIS — H9201 Otalgia, right ear: Secondary | ICD-10-CM

## 2023-04-11 MED ORDER — AMOXICILLIN 875 MG PO TABS
875.0000 mg | ORAL_TABLET | Freq: Two times a day (BID) | ORAL | 0 refills | Status: AC
Start: 2023-04-11 — End: 2023-04-21
  Filled 2023-04-11: qty 20, 10d supply, fill #0

## 2023-04-11 NOTE — Progress Notes (Signed)
E-Visit for Ear Pain - Acute Otitis Media   We are sorry that you are not feeling well. Here is how we plan to help!  Based on what you have shared with me it looks like you have Acute Otitis Media.  Acute Otitis Media is an infection of the middle or "inner" ear. This type of infection can cause redness, inflammation, and fluid buildup behind the tympanic membrane (ear drum).  The usual symptoms include: Earache/Pain Fever Upper respiratory symptoms Lack of energy/Fatigue/Malaise Slight hearing loss gradually worsening- if the inner ear fills with fluid What causes middle ear infections? Most middle ear infections occur when an infection such as a cold, leads to a build-up of mucus in the middle ear and causes the Eustachian tube (a thin tube that runs from the middle ear to the back of the nose) to become swollen or blocked.   This means mucus can't drain away properly, making it easier for an infection to spread into the middle ear.  How middle ear infections are treated: Most ear infections clear up within three to five days and don't need any specific treatment. If necessary, tylenol or ibuprofen should be used to relieve pain and a high temperature.  If you develop a fever higher than 102, or any significantly worsening symptoms, this could indicate a more serious infection moving to the middle/inner and needs face to face evaluation in an office by a provider.   Antibiotics aren't routinely used to treat middle ear infections, although they may occasionally be prescribed if symptoms persist or are particularly severe. Given your presentation,   I have prescribed Amoxicillin 875 mg one tablet twice daily for 10 days   Your symptoms should improve over the next 3 days and should resolve in about 7 days. Be sure to complete ALL of the prescription(s) given.  HOME CARE: Wash your hands frequently. If you are prescribed an ear drop, do not place the tip of the bottle on your ear or  touch it with your fingers. You can take Acetaminophen 650 mg every 4-6 hours as needed for pain.  If pain is severe or moderate, you can apply a heating pad (set on low) or hot water bottle (wrapped in a towel) to outer ear for 20 minutes.  This will also increase drainage.  GET HELP RIGHT AWAY IF: Fever is over 102.2 degrees. You develop progressive ear pain or hearing loss. Ear symptoms persist longer than 3 days after treatment.  MAKE SURE YOU: Understand these instructions. Will watch your condition. Will get help right away if you are not doing well or get worse.  Thank you for choosing an e-visit.  Your e-visit answers were reviewed by a board certified advanced clinical practitioner to complete your personal care plan. Depending upon the condition, your plan could have included both over the counter or prescription medications.  Please review your pharmacy choice. Make sure the pharmacy is open so you can pick up the prescription now. If there is a problem, you may contact your provider through MyChart messaging and have the prescription routed to another pharmacy.  Your safety is important to us. If you have drug allergies check your prescription carefully.   For the next 24 hours you can use MyChart to ask questions about today's visit, request a non-urgent call back, or ask for a work or school excuse. You will get an email with a survey after your eVisit asking about your experience. We would appreciate your feedback. I hope   that your e-visit has been valuable and will aid in your recovery.   

## 2023-04-11 NOTE — Progress Notes (Signed)
I have spent 5 minutes in review of e-visit questionnaire, review and updating patient chart, medical decision making and response to patient.   Tieisha Darden Cody Pammie Chirino, PA-C    

## 2023-04-12 ENCOUNTER — Encounter: Payer: Self-pay | Admitting: Family

## 2023-04-12 NOTE — Progress Notes (Signed)
Bethanie Dicker, NP-C Phone: [phone]  Mary Richards is a 37 y.o. female who presents today for cough and wheezing.   Patient had an E-Visit on 04/11/2023 and was treated for an ear infection with Amoxicillin 875 BID x 10 days. She has been taking the medication as prescribed and reports her ear pain has improved but she continues to have worsening respiratory symptoms. She was treated for pneumonia in April. Her cough is worse at night and she has noticed some wheezing. She has been using her Symbicort inhaler at home. She also has an albuterol inhaler at home.   Respiratory illness:  Cough- Yes  Congestion-    Sinus- Yes   Chest- Yes  Post nasal drip- Yes  Sore throat- No  Shortness of breath- No  Fever- No  Fatigue/Myalgia- Yes Headache- No Nausea/Vomiting- No Taste disturbance- No  Smell disturbance- No  Covid exposure- No  Covid vaccination- x 2  Flu vaccination- Not due  Medications- Sudafed, Nasal spray and Zyrtec   Social History   Tobacco Use  Smoking Status Never  Smokeless Tobacco Never    Current Outpatient Medications on File Prior to Visit  Medication Sig Dispense Refill   albuterol (PROVENTIL) (2.5 MG/3ML) 0.083% nebulizer solution Take 3 mLs (2.5 mg total) by nebulization every 6 (six) hours as needed for wheezing or shortness of breath. 150 mL 2   albuterol (VENTOLIN HFA) 108 (90 Base) MCG/ACT inhaler INHALE 2 PUFFS INTO THE LUNGS EVERY 6 HOURS AS NEEDED FOR WHEEZING OR SHORTNESS OF BREATH. 6.7 g 1   amoxicillin (AMOXIL) 875 MG tablet Take 1 tablet (875 mg total) by mouth 2 (two) times daily for 10 days. 20 tablet 0   azelastine (ASTELIN) 0.1 % nasal spray Place 1 spray into both nostrils 2 (two) times daily. Use in each nostril as directed 30 mL 3   cetirizine (ZYRTEC) 10 MG tablet Take 10 mg by mouth daily.     cholestyramine (QUESTRAN) 4 g packet Take 1 packet (4 g total) by mouth 2 (two) times daily. 60 each 11   fluticasone (FLONASE) 50  MCG/ACT nasal spray Place 2 sprays into both nostrils daily. 16 g 0   meloxicam (MOBIC) 15 MG tablet Take 1 tablet (15 mg total) by mouth daily as needed for pain. 30 tablet 1   phentermine (ADIPEX-P) 37.5 MG tablet Take 1 tablet (37.5 mg total) by mouth daily before breakfast. 30 tablet 2   No current facility-administered medications on file prior to visit.    ROS see history of present illness  Objective  Physical Exam Vitals:   04/13/23 0818  BP: 110/70  Pulse: 77  Temp: 98.3 F (36.8 C)  SpO2: 98%    BP Readings from Last 3 Encounters:  04/13/23 110/70  03/06/23 120/78  02/22/23 128/76   Wt Readings from Last 3 Encounters:  04/13/23 192 lb 6.4 oz (87.3 kg)  04/05/23 188 lb (85.3 kg)  03/06/23 191 lb (86.6 kg)    Physical Exam Constitutional:      General: She is not in acute distress.    Appearance: Normal appearance.  HENT:     Head: Normocephalic.     Right Ear: Tympanic membrane normal.     Left Ear: Tympanic membrane normal.     Nose: Congestion present.     Mouth/Throat:     Mouth: Mucous membranes are moist.     Pharynx: Oropharynx is clear.  Eyes:     Conjunctiva/sclera: Conjunctivae normal.  Pupils: Pupils are equal, round, and reactive to light.  Cardiovascular:     Rate and Rhythm: Normal rate and regular rhythm.     Heart sounds: Normal heart sounds.  Pulmonary:     Effort: Pulmonary effort is normal.     Breath sounds: Normal breath sounds. No wheezing or rhonchi.  Abdominal:     General: Abdomen is flat. Bowel sounds are normal.     Palpations: Abdomen is soft. There is no mass.     Tenderness: There is no abdominal tenderness.  Lymphadenopathy:     Cervical: No cervical adenopathy.  Skin:    General: Skin is warm and dry.  Neurological:     General: No focal deficit present.     Mental Status: She is alert.  Psychiatric:        Mood and Affect: Mood normal.        Behavior: Behavior normal.    Assessment/Plan: Please see  individual problem list.  Respiratory illness Assessment & Plan: Patient currently on Day 3 of Amoxicillin 875 mg BID, encouraged her to complete entire course of medication. Will add MDP. Lungs clear on exam. Advised to use albuterol inhaler every 6 hours for the next 2 days then she can return to PRN use. She can continue her Symbicort inhaler daily. She can continue Mucinex/Sudafed PE PRN. Advised adequate fluid intake. Return precautions given to patient.   Orders: -     methylPREDNISolone; Take as directed. Instructions on package  Dispense: 21 each; Refill: 0   Return if symptoms worsen or fail to improve.   Bethanie Dicker, NP-C [facility] [employer]

## 2023-04-12 NOTE — Telephone Encounter (Signed)
Patient is scheduled with Bethanie Dicker on 04/13/23 at 8:00.

## 2023-04-13 ENCOUNTER — Ambulatory Visit (INDEPENDENT_AMBULATORY_CARE_PROVIDER_SITE_OTHER): Payer: 59 | Admitting: Nurse Practitioner

## 2023-04-13 ENCOUNTER — Other Ambulatory Visit: Payer: Self-pay

## 2023-04-13 ENCOUNTER — Encounter: Payer: Self-pay | Admitting: Nurse Practitioner

## 2023-04-13 VITALS — BP 110/70 | HR 77 | Temp 98.3°F | Ht 67.0 in | Wt 192.4 lb

## 2023-04-13 DIAGNOSIS — J989 Respiratory disorder, unspecified: Secondary | ICD-10-CM

## 2023-04-13 MED ORDER — METHYLPREDNISOLONE 4 MG PO TBPK
ORAL_TABLET | ORAL | 0 refills | Status: DC
Start: 2023-04-13 — End: 2023-06-30
  Filled 2023-04-13: qty 21, 6d supply, fill #0

## 2023-04-13 NOTE — Assessment & Plan Note (Addendum)
Patient currently on Day 3 of Amoxicillin 875 mg BID, encouraged her to complete entire course of medication. Will add MDP. Lungs clear on exam. Advised to use albuterol inhaler every 6 hours for the next 2 days then she can return to PRN use. She can continue her Symbicort inhaler daily. She can continue Mucinex/Sudafed PE PRN. Advised adequate fluid intake. Return precautions given to patient.

## 2023-04-26 ENCOUNTER — Other Ambulatory Visit
Admission: RE | Admit: 2023-04-26 | Discharge: 2023-04-26 | Disposition: A | Discharge status: Home / Self Care | Payer: 59 | Attending: Family | Admitting: Family

## 2023-04-26 DIAGNOSIS — R899 Unspecified abnormal finding in specimens from other organs, systems and tissues: Secondary | ICD-10-CM | POA: Insufficient documentation

## 2023-04-26 LAB — COMPREHENSIVE METABOLIC PANEL
ALT: 14 U/L (ref 0–44)
AST: 20 U/L (ref 15–41)
Albumin: 3.9 g/dL (ref 3.5–5.0)
Alkaline Phosphatase: 71 U/L (ref 38–126)
Anion gap: 8 (ref 5–15)
BUN: 23 mg/dL — ABNORMAL HIGH (ref 6–20)
CO2: 26 mmol/L (ref 22–32)
Calcium: 8.1 mg/dL — ABNORMAL LOW (ref 8.9–10.3)
Chloride: 105 mmol/L (ref 98–111)
Creatinine, Ser: 0.66 mg/dL (ref 0.44–1.00)
GFR, Estimated: >60 mL/min (ref >60)
Glucose, Bld: 98 mg/dL (ref 70–99)
Potassium: 4.1 mmol/L (ref 3.5–5.1)
Sodium: 139 mmol/L (ref 135–145)
Total Bilirubin: 0.5 mg/dL (ref 0.3–1.2)
Total Protein: 6.4 g/dL — ABNORMAL LOW (ref 6.5–8.1)

## 2023-04-26 LAB — CBC WITH DIFFERENTIAL/PLATELET
Abs Immature Granulocytes: 0.05 10*3/uL (ref 0.00–0.07)
Basophils Absolute: 0.1 10*3/uL (ref 0.0–0.1)
Basophils Relative: 1 %
Eosinophils Absolute: 0.4 10*3/uL (ref 0.0–0.5)
Eosinophils Relative: 4 %
HCT: 40.8 % (ref 36.0–46.0)
Hemoglobin: 13 g/dL (ref 12.0–15.0)
Immature Granulocytes: 1 %
Lymphocytes Relative: 37 %
Lymphs Abs: 3.6 10*3/uL (ref 0.7–4.0)
MCH: 29 pg (ref 26.0–34.0)
MCHC: 31.9 g/dL (ref 30.0–36.0)
MCV: 90.9 fL (ref 80.0–100.0)
Monocytes Absolute: 0.7 10*3/uL (ref 0.1–1.0)
Monocytes Relative: 7 %
Neutro Abs: 5.1 10*3/uL (ref 1.7–7.7)
Neutrophils Relative %: 50 %
Platelets: 323 10*3/uL (ref 150–400)
RBC: 4.49 MIL/uL (ref 3.87–5.11)
RDW: 13.2 % (ref 11.5–15.5)
WBC: 9.9 10*3/uL (ref 4.0–10.5)
nRBC: 0 % (ref 0.0–0.2)

## 2023-05-10 ENCOUNTER — Other Ambulatory Visit: Payer: Self-pay

## 2023-05-10 DIAGNOSIS — H6981 Other specified disorders of Eustachian tube, right ear: Secondary | ICD-10-CM | POA: Diagnosis not present

## 2023-05-10 DIAGNOSIS — H9011 Conductive hearing loss, unilateral, right ear, with unrestricted hearing on the contralateral side: Secondary | ICD-10-CM | POA: Diagnosis not present

## 2023-05-10 MED ORDER — PREDNISONE 10 MG PO TABS
ORAL_TABLET | ORAL | 0 refills | Status: AC
  Filled 2023-05-10: qty 42, 12d supply, fill #0

## 2023-05-24 ENCOUNTER — Encounter: Payer: Self-pay | Admitting: Family

## 2023-06-01 DIAGNOSIS — H9011 Conductive hearing loss, unilateral, right ear, with unrestricted hearing on the contralateral side: Secondary | ICD-10-CM | POA: Diagnosis not present

## 2023-06-21 ENCOUNTER — Telehealth: Payer: 59 | Admitting: Physician Assistant

## 2023-06-21 DIAGNOSIS — B3731 Acute candidiasis of vulva and vagina: Secondary | ICD-10-CM | POA: Diagnosis not present

## 2023-06-22 MED ORDER — FLUCONAZOLE 150 MG PO TABS
150.0000 mg | ORAL_TABLET | Freq: Once | ORAL | 0 refills | Status: AC
Start: 2023-06-22 — End: 2023-06-22

## 2023-06-22 NOTE — Progress Notes (Signed)
I have spent 5 minutes in review of e-visit questionnaire, review and updating patient chart, medical decision making and response to patient.    Cody , PA-C    

## 2023-06-22 NOTE — Progress Notes (Signed)

## 2023-06-23 ENCOUNTER — Encounter: Payer: Self-pay | Admitting: Family

## 2023-06-23 NOTE — Telephone Encounter (Signed)
Spoke to pt and informed her that you would probably recommend you going to UC or ED. Pt declined Pt stated that she has been dealing with it for a couple weeks but does not feel like it warrants and ED visit. Pt scheduled and in office appt for 06/30/23

## 2023-06-30 ENCOUNTER — Ambulatory Visit (INDEPENDENT_AMBULATORY_CARE_PROVIDER_SITE_OTHER): Payer: 59 | Admitting: Family

## 2023-06-30 ENCOUNTER — Other Ambulatory Visit
Admission: RE | Admit: 2023-06-30 | Discharge: 2023-06-30 | Disposition: A | Discharge status: Home / Self Care | Payer: 59 | Source: Ambulatory Visit | Attending: Family | Admitting: Family

## 2023-06-30 ENCOUNTER — Telehealth: Payer: Self-pay | Admitting: Family

## 2023-06-30 ENCOUNTER — Other Ambulatory Visit: Payer: Self-pay

## 2023-06-30 ENCOUNTER — Encounter: Payer: Self-pay | Admitting: Family

## 2023-06-30 ENCOUNTER — Telehealth: Payer: Self-pay

## 2023-06-30 VITALS — BP 105/72 | HR 71 | Temp 98.5°F | Ht 67.0 in | Wt 196.4 lb

## 2023-06-30 DIAGNOSIS — R062 Wheezing: Secondary | ICD-10-CM

## 2023-06-30 DIAGNOSIS — J0100 Acute maxillary sinusitis, unspecified: Secondary | ICD-10-CM | POA: Insufficient documentation

## 2023-06-30 DIAGNOSIS — R42 Dizziness and giddiness: Secondary | ICD-10-CM

## 2023-06-30 DIAGNOSIS — J45909 Unspecified asthma, uncomplicated: Secondary | ICD-10-CM

## 2023-06-30 DIAGNOSIS — J329 Chronic sinusitis, unspecified: Secondary | ICD-10-CM | POA: Insufficient documentation

## 2023-06-30 LAB — CBC WITH DIFFERENTIAL/PLATELET
Abs Immature Granulocytes: 0.04 10*3/uL (ref 0.00–0.07)
Basophils Absolute: 0.1 10*3/uL (ref 0.0–0.1)
Basophils Relative: 1 %
Eosinophils Absolute: 0.3 10*3/uL (ref 0.0–0.5)
Eosinophils Relative: 3 %
HCT: 44.9 % (ref 36.0–46.0)
Hemoglobin: 14.2 g/dL (ref 12.0–15.0)
Immature Granulocytes: 0 %
Lymphocytes Relative: 41 %
Lymphs Abs: 3.8 10*3/uL (ref 0.7–4.0)
MCH: 28.7 pg (ref 26.0–34.0)
MCHC: 31.6 g/dL (ref 30.0–36.0)
MCV: 90.9 fL (ref 80.0–100.0)
Monocytes Absolute: 0.7 10*3/uL (ref 0.1–1.0)
Monocytes Relative: 8 %
Neutro Abs: 4.3 10*3/uL (ref 1.7–7.7)
Neutrophils Relative %: 47 %
Platelets: 364 10*3/uL (ref 150–400)
RBC: 4.94 MIL/uL (ref 3.87–5.11)
RDW: 13.1 % (ref 11.5–15.5)
WBC: 9.1 10*3/uL (ref 4.0–10.5)
nRBC: 0 % (ref 0.0–0.2)

## 2023-06-30 LAB — COMPREHENSIVE METABOLIC PANEL
ALT: 15 U/L (ref 0–44)
AST: 18 U/L (ref 15–41)
Albumin: 4.2 g/dL (ref 3.5–5.0)
Alkaline Phosphatase: 73 U/L (ref 38–126)
Anion gap: 7 (ref 5–15)
BUN: 14 mg/dL (ref 6–20)
CO2: 25 mmol/L (ref 22–32)
Calcium: 8.9 mg/dL (ref 8.9–10.3)
Chloride: 106 mmol/L (ref 98–111)
Creatinine, Ser: 0.55 mg/dL (ref 0.44–1.00)
GFR, Estimated: >60 mL/min (ref >60)
Glucose, Bld: 85 mg/dL (ref 70–99)
Potassium: 4.1 mmol/L (ref 3.5–5.1)
Sodium: 138 mmol/L (ref 135–145)
Total Bilirubin: 1.3 mg/dL — ABNORMAL HIGH (ref 0.3–1.2)
Total Protein: 7 g/dL (ref 6.5–8.1)

## 2023-06-30 LAB — TSH: TSH: 0.955 u[IU]/mL (ref 0.350–4.500)

## 2023-06-30 MED ORDER — BUDESONIDE-FORMOTEROL FUMARATE 80-4.5 MCG/ACT IN AERO
2.0000 | INHALATION_SPRAY | Freq: Two times a day (BID) | RESPIRATORY_TRACT | 3 refills | Status: AC
  Filled 2023-06-30 – 2023-09-05 (×2): qty 10.2, 30d supply, fill #0

## 2023-06-30 MED ORDER — FLUCONAZOLE 150 MG PO TABS
150.0000 mg | ORAL_TABLET | Freq: Once | ORAL | 1 refills | Status: AC
  Filled 2023-06-30: qty 2, 2d supply, fill #0

## 2023-06-30 MED ORDER — BUDESONIDE-FORMOTEROL FUMARATE 80-4.5 MCG/ACT IN AERO: 2.0000 | INHALATION_SPRAY | Freq: Two times a day (BID) | RESPIRATORY_TRACT | 3 refills | Status: DC

## 2023-06-30 MED ORDER — DOXYCYCLINE HYCLATE 100 MG PO TABS
100.0000 mg | ORAL_TABLET | Freq: Two times a day (BID) | ORAL | 0 refills | Status: AC
  Filled 2023-06-30: qty 14, 7d supply, fill #0

## 2023-06-30 MED ORDER — DOXYCYCLINE HYCLATE 100 MG PO TABS: 100.0000 mg | ORAL_TABLET | Freq: Two times a day (BID) | ORAL | 0 refills | Status: DC

## 2023-06-30 MED ORDER — FLUCONAZOLE 150 MG PO TABS: 150.0000 mg | ORAL_TABLET | Freq: Once | ORAL | 1 refills | Status: DC

## 2023-06-30 NOTE — Assessment & Plan Note (Addendum)
Presentation is more consistent with vertigo.  Patient has a history of tinnitus left ear.  I question if congestion has aggravated.  Blood pressure on low end of normal.  She is not orthostatic ( see flow sheet) . If symptoms persist, we will pursue neuroimaging, with IAC, referral to vestibular rehab, perhaps trial of meclizine.  Close follow up.

## 2023-06-30 NOTE — Patient Instructions (Signed)
Start doxycycline  Ensure to take probiotics while on antibiotics and also for 2 weeks after completion. This can either be by eating yogurt daily or taking a probiotic supplement over the counter such as Culturelle.It is important to re-colonize the gut with good bacteria and also to prevent any diarrheal infections associated with antibiotic use.    Labs at hospital this week/or next  Sedan of water  Careful with changing positions.    Please let me know if symptoms do not resolve.

## 2023-06-30 NOTE — Progress Notes (Unsigned)
Assessment & Plan:  There are no diagnoses linked to this encounter.   Return precautions given.   Risks, benefits, and alternatives of the medications and treatment plan prescribed today were discussed, and patient expressed understanding.   Education regarding symptom management and diagnosis given to patient on AVS either electronically or printed.  No follow-ups on file.  [name], FNP  Subjective:    Patient ID: Mary Richards, female    DOB: 1986/09/06, 37 y.o.   MRN: [id number]  CC: Mary Richards is a 37 y.o. female who presents today for an acute visit.    HPI: Complains cough x one week, worsening  Endorses facial pressure ,sore throat, episodic wheezing more noticeable in the morning when getting OOB, productive green cough  No fever, nasal congestion, rash.  Describes facial pressure as 'sore'.   She has been using symbicort, albuterol with relief. She is taking Xyzal.   She also complains of vertigo which started 2 weeks ago, episodic.   She will feel dizzy when turned her head left or right, as well as down to the ground.   Chronic right tinnitus , worse the past 2 weeks.    She has seen ENT ( in June and July) whom prescribed oral prednisone with some relief. Hearing test improved per patient.   She didn't feel lightheaded.  No cp, palpitations, syncope, HA, vision loss, numbness.   She would feel her heart 'pound harder'.   She has a child at home with HFM. She has gotten a little sleep past couple of nights as he has been febrile, up at night,     She has doxycycline in April 2023 without hives.    History of migraine, asthma, allergic rhinitis.   Allergies: Azithromycin, Sulfa antibiotics, Ciprofloxacin, Moxifloxacin hcl, Bactrim [sulfamethoxazole-trimethoprim], and Wound dressing adhesive Current Outpatient Medications on File Prior to Visit  Medication Sig Dispense Refill   albuterol (PROVENTIL) (2.5 MG/3ML) 0.083%  nebulizer solution Take 3 mLs (2.5 mg total) by nebulization every 6 (six) hours as needed for wheezing or shortness of breath. 150 mL 2   albuterol (VENTOLIN HFA) 108 (90 Base) MCG/ACT inhaler INHALE 2 PUFFS INTO THE LUNGS EVERY 6 HOURS AS NEEDED FOR WHEEZING OR SHORTNESS OF BREATH. 6.7 g 1   azelastine (ASTELIN) 0.1 % nasal spray Place 1 spray into both nostrils 2 (two) times daily. Use in each nostril as directed 30 mL 3   cetirizine (ZYRTEC) 10 MG tablet Take 10 mg by mouth daily.     cholestyramine (QUESTRAN) 4 g packet Take 1 packet (4 g total) by mouth 2 (two) times daily. 60 each 11   fluticasone (FLONASE) 50 MCG/ACT nasal spray Place 2 sprays into both nostrils daily. 16 g 0   meloxicam (MOBIC) 15 MG tablet Take 1 tablet (15 mg total) by mouth daily as needed for pain. 30 tablet 1   methylPREDNISolone (MEDROL DOSEPAK) 4 MG TBPK tablet Take as directed. Instructions on package 21 each 0   phentermine (ADIPEX-P) 37.5 MG tablet Take 1 tablet (37.5 mg total) by mouth daily before breakfast. 30 tablet 2   No current facility-administered medications on file prior to visit.    Review of Systems  Constitutional:  Negative for chills and fever.  HENT:  Positive for congestion.   Respiratory:  Positive for cough and wheezing. Negative for shortness of breath.   Cardiovascular:  Negative for chest pain and palpitations.  Gastrointestinal:  Negative for nausea and vomiting.  Objective:    BP 105/72   Pulse 71   Temp 98.5 F (36.9 C)   Ht 5\' 7"  (1.702 m)   Wt 196 lb 6.4 oz (89.1 kg)   SpO2 99%   BMI 30.76 kg/m   BP Readings from Last 3 Encounters:  06/30/23 105/72  04/13/23 110/70  03/06/23 120/78   Wt Readings from Last 3 Encounters:  06/30/23 196 lb 6.4 oz (89.1 kg)  04/13/23 192 lb 6.4 oz (87.3 kg)  04/05/23 188 lb (85.3 kg)   No data found.  Physical Exam Vitals reviewed.  Constitutional:      Appearance: She is well-developed.  HENT:     Head: Normocephalic and  atraumatic.     Right Ear: Hearing, tympanic membrane, ear canal and external ear normal. No decreased hearing noted. No drainage, swelling or tenderness. No middle ear effusion. No foreign body. Tympanic membrane is not erythematous or bulging.     Left Ear: Hearing, tympanic membrane, ear canal and external ear normal. No decreased hearing noted. No drainage, swelling or tenderness.  No middle ear effusion. No foreign body. Tympanic membrane is not erythematous or bulging.     Nose: Nose normal. No rhinorrhea.     Right Sinus: No maxillary sinus tenderness or frontal sinus tenderness.     Left Sinus: No maxillary sinus tenderness or frontal sinus tenderness.     Mouth/Throat:     Pharynx: Uvula midline. No oropharyngeal exudate or posterior oropharyngeal erythema.     Tonsils: No tonsillar abscesses.  Eyes:     Conjunctiva/sclera: Conjunctivae normal.  Cardiovascular:     Rate and Rhythm: Regular rhythm.     Pulses: Normal pulses.     Heart sounds: Normal heart sounds.  Pulmonary:     Effort: Pulmonary effort is normal.     Breath sounds: Normal breath sounds. No wheezing, rhonchi or rales.  Lymphadenopathy:     Head:     Right side of head: No submental, submandibular, tonsillar, preauricular, posterior auricular or occipital adenopathy.     Left side of head: No submental, submandibular, tonsillar, preauricular, posterior auricular or occipital adenopathy.     Cervical: No cervical adenopathy.  Skin:    General: Skin is warm and dry.  Neurological:     Mental Status: She is alert.  Psychiatric:        Speech: Speech normal.        Behavior: Behavior normal.        Thought Content: Thought content normal.

## 2023-06-30 NOTE — Telephone Encounter (Signed)
Call [facility] ent,  We need all  OV notes from Dr Jenne Campus from May and July

## 2023-06-30 NOTE — Telephone Encounter (Signed)
Patient states at check-out that her prescription for inhaler and antibiotic need to go to [facility] at [facility].

## 2023-06-30 NOTE — Telephone Encounter (Signed)
E fax has been sent to [facility] ENT attn Dr. Jenne Campus @ [phone] requesting OV notes

## 2023-06-30 NOTE — Telephone Encounter (Signed)
Pt informed and stated it got worked out

## 2023-06-30 NOTE — Assessment & Plan Note (Signed)
Presentation c/w sinusitis, duration one week. In the setting of constellation of symptoms, concern for bacterial sinusitis and we opted to treat with antibiotic. Long discussion with patient regards to drug allergies.  She recently had doxycycline without any side effect and is comfortable in using again. Counseled on risk of sun burn and advised sun protection. She will let me know how she is doing

## 2023-06-30 NOTE — Telephone Encounter (Signed)
I have sent. Please let pt know

## 2023-07-05 ENCOUNTER — Encounter: Payer: Self-pay | Admitting: Family

## 2023-07-21 ENCOUNTER — Other Ambulatory Visit: Payer: Self-pay

## 2023-07-21 ENCOUNTER — Telehealth: Payer: 59 | Admitting: Physician Assistant

## 2023-07-21 DIAGNOSIS — B9689 Other specified bacterial agents as the cause of diseases classified elsewhere: Secondary | ICD-10-CM

## 2023-07-21 DIAGNOSIS — J019 Acute sinusitis, unspecified: Secondary | ICD-10-CM

## 2023-07-21 MED ORDER — AMOXICILLIN-POT CLAVULANATE 875-125 MG PO TABS
1.0000 | ORAL_TABLET | Freq: Two times a day (BID) | ORAL | 0 refills | Status: DC
Start: 2023-07-21 — End: 2023-08-04
  Filled 2023-07-21: qty 20, 10d supply, fill #0

## 2023-07-21 MED ORDER — PREDNISONE 20 MG PO TABS
20.0000 mg | ORAL_TABLET | Freq: Two times a day (BID) | ORAL | 0 refills | Status: AC
Start: 2023-07-21 — End: 2023-07-26
  Filled 2023-07-21: qty 10, 5d supply, fill #0

## 2023-07-21 NOTE — Addendum Note (Signed)
Addended by: Viviano Simas E on: 07/21/2023 10:16 AM   Modules accepted: Orders

## 2023-07-21 NOTE — Progress Notes (Signed)
E-Visit for Sinus Problems  We are sorry that you are not feeling well.  Here is how we plan to help!  Based on what you have shared with me it looks like you have sinusitis.  Sinusitis is inflammation and infection in the sinus cavities of the head.  Based on your presentation I believe you most likely have Acute Bacterial Sinusitis.  This is an infection caused by bacteria and is treated with antibiotics. I have prescribed  Augmentin 875mg/125mg one tablet twice daily with food, for 10 days.You may use an oral decongestant such as Mucinex D or if you have glaucoma or high blood pressure use plain Mucinex. Saline nasal spray help and can safely be used as often as needed for congestion.  If you develop worsening sinus pain, fever or notice severe headache and vision changes, or if symptoms are not better after completion of antibiotic, please schedule an appointment with a health care provider.    Sinus infections are not as easily transmitted as other respiratory infection, however we still recommend that you avoid close contact with loved ones, especially the very young and elderly.  Remember to wash your hands thoroughly throughout the day as this is the number one way to prevent the spread of infection!  Home Care: Only take medications as instructed by your medical team. Complete the entire course of an antibiotic. Do not take these medications with alcohol. A steam or ultrasonic humidifier can help congestion.  You can place a towel over your head and breathe in the steam from hot water coming from a faucet. Avoid close contacts especially the very young and the elderly. Cover your mouth when you cough or sneeze. Always remember to wash your hands.  Get Help Right Away If: You develop worsening fever or sinus pain. You develop a severe head ache or visual changes. Your symptoms persist after you have completed your treatment plan.  Make sure you Understand these instructions. Will  watch your condition. Will get help right away if you are not doing well or get worse.  Thank you for choosing an e-visit.  Your e-visit answers were reviewed by a board certified advanced clinical practitioner to complete your personal care plan. Depending upon the condition, your plan could have included both over the counter or prescription medications.  Please review your pharmacy choice. Make sure the pharmacy is open so you can pick up prescription now. If there is a problem, you may contact your provider through MyChart messaging and have the prescription routed to another pharmacy.  Your safety is important to us. If you have drug allergies check your prescription carefully.   For the next 24 hours you can use MyChart to ask questions about today's visit, request a non-urgent call back, or ask for a work or school excuse. You will get an email in the next two days asking about your experience. I hope that your e-visit has been valuable and will speed your recovery.  I have spent 5 minutes in review of e-visit questionnaire, review and updating patient chart, medical decision making and response to patient.   Jennifer M Burnette, PA-C  

## 2023-07-24 ENCOUNTER — Ambulatory Visit: Payer: 59 | Admitting: Family

## 2023-08-04 ENCOUNTER — Telehealth: Payer: 59 | Admitting: Emergency Medicine

## 2023-08-04 ENCOUNTER — Encounter: Payer: Self-pay | Admitting: Nurse Practitioner

## 2023-08-04 ENCOUNTER — Ambulatory Visit: Payer: 59 | Admitting: Nurse Practitioner

## 2023-08-04 ENCOUNTER — Ambulatory Visit
Admission: RE | Admit: 2023-08-04 | Discharge: 2023-08-04 | Disposition: A | Discharge status: Home / Self Care | Payer: 59 | Source: Ambulatory Visit | Attending: Nurse Practitioner | Admitting: Nurse Practitioner

## 2023-08-04 ENCOUNTER — Ambulatory Visit
Admission: RE | Admit: 2023-08-04 | Discharge: 2023-08-04 | Disposition: A | Discharge status: Home / Self Care | Payer: 59 | Source: Ambulatory Visit | Attending: Nurse Practitioner | Admitting: *Deleted

## 2023-08-04 ENCOUNTER — Other Ambulatory Visit: Payer: Self-pay

## 2023-08-04 VITALS — BP 112/80 | HR 72 | Temp 97.9°F | Ht 67.0 in | Wt 205.8 lb

## 2023-08-04 DIAGNOSIS — J069 Acute upper respiratory infection, unspecified: Secondary | ICD-10-CM

## 2023-08-04 DIAGNOSIS — R059 Cough, unspecified: Secondary | ICD-10-CM

## 2023-08-04 MED ORDER — DOXYCYCLINE HYCLATE 100 MG PO TABS
100.0000 mg | ORAL_TABLET | Freq: Two times a day (BID) | ORAL | 0 refills | Status: DC
  Filled 2023-08-04: qty 20, 10d supply, fill #0

## 2023-08-04 MED ORDER — CEFPODOXIME PROXETIL 200 MG PO TABS
200.0000 mg | ORAL_TABLET | Freq: Two times a day (BID) | ORAL | 0 refills | Status: DC
  Filled 2023-08-04: qty 20, 5d supply, fill #0
  Filled 2023-08-04 (×2): qty 20, 10d supply, fill #0

## 2023-08-04 NOTE — Progress Notes (Signed)
Because you have been sick for so long, and are wheezing, I feel your condition warrants further evaluation and I recommend that you be seen in a face to face visit. I recommend you be seen in an urgent care.    NOTE: There will be NO CHARGE for this eVisit   If you are having a true medical emergency please call 911.      For an urgent face to face visit, [facility] has eight urgent care centers for your convenience:   NEW!! [facility] at [facility] Get Driving Directions [phone] [address] [place], [address]    [facility] at [facility] Get Driving Directions [phone] [address] [place], [place] [address]   [facility]) Get Driving Directions [phone] [address] [place], [place] [address]  [facility]) Get Driving Directions [phone] [address] [place],  [place]  [address]  [facility] - at [employer]  [phone] [address] [place],  [place] [address]   [facility] at [facility] Get Driving Directions [phone] [address] [place], [place] [address]   [facility] at [facility] Get Driving Directions  [phone] [address] [place], [place] [address]   [facility] at [facility] Directions [phone] [address], [address] [place], [place] [address]  Your MyChart E-visit questionnaire answers were reviewed by a board certified advanced clinical practitioner to complete your personal care plan based on your specific symptoms.  Thank you for using e-Visits.

## 2023-08-04 NOTE — Progress Notes (Signed)
Established Patient Office Visit  Subjective:  Patient ID: Mary Richards, female    DOB: 1986/06/01  Age: 37 y.o. MRN: [id number]  CC:  Chief Complaint  Patient presents with   chest congestion    X 1 month. Pt has had two rounds of antibiotics and prednisone.     HPI  Mary Richards presents for persistent cough and sinusitis since last 1 month.  Patient has been have to round of antibiotics and prednisone without significant improvement.  Patient states that the congestion is worse in the morning and improves as the day passes by.  Patient reports that her husband and child experienced the same symptoms they are feeling better now.  HPI   Past Medical History:  Diagnosis Date   Asthma    Chronic migraine    Complication of anesthesia    DDD (degenerative disc disease), lumbosacral    Family history of adverse reaction to anesthesia    Mother and Father - PONV   PONV (postoperative nausea and vomiting)    Ruptured left tubal ectopic pregnancy causing hemoperitoneum 01/23/2021   Wears contact lenses     Past Surgical History:  Procedure Laterality Date   CHOLECYSTECTOMY     COLONOSCOPY WITH PROPOFOL N/A 02/19/2021   Procedure: COLONOSCOPY WITH PROPOFOL;  Surgeon: Wyline Mood, MD;  Location: [facility] ENDOSCOPY;  Service: Gastroenterology;  Laterality: N/A;   DIAGNOSTIC LAPAROSCOPY WITH REMOVAL OF ECTOPIC PREGNANCY Left 01/23/2021   Procedure: DIAGNOSTIC LAPAROSCOPY WITH REMOVAL OF ECTOPIC PREGNANCY;  Surgeon: Conard Novak, MD;  Location: [facility] ORS;  Service: Gynecology;  Laterality: Left;   PILONIDAL CYST EXCISION     TONSILECTOMY, ADENOIDECTOMY, BILATERAL MYRINGOTOMY AND TUBES     TYMPANOPLASTY Right 05/10/2019   Procedure: TYMPANOPLASTY;  Surgeon: Linus Salmons, MD;  Location: [facility];  Service: ENT;  Laterality: Right;    Family History  Problem Relation Age of Onset   Irritable bowel syndrome Mother    Brain cancer Father 7        Glioblastoma, died one year after dx   Valvular heart disease Sister    COPD Maternal Grandfather    Brain cancer Paternal Grandmother 39       unsure what type, she was not treated   Thyroid cancer Neg Hx    Colon cancer Neg Hx    Breast cancer Neg Hx     Social History   Socioeconomic History   Marital status: Married    Spouse name: Designer, fashion/clothing   Number of children: Not on file   Years of education: Not on file   Highest education level: Not on file  Occupational History   Not on file  Tobacco Use   Smoking status: Never   Smokeless tobacco: Never  Vaping Use   Vaping status: Never Used  Substance and Sexual Activity   Alcohol use: Yes    Alcohol/week: 2.0 standard drinks of alcohol    Types: 2 Standard drinks or equivalent per week    Comment: Social    Drug use: Never   Sexual activity: Yes    Birth control/protection: Condom    Comment: Unknown  Other Topics Concern   Not on file  Social History Narrative   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    2 children   53 year old ( boy)   47 month old ( boy)   Social Determinants of Corporate investment banker Strain: Not on file  Food Insecurity: Not on  file  Transportation Needs: Not on file  Physical Activity: Insufficiently Active (02/08/2023)   Exercise Vital Sign    Days of Exercise per Week: 2 days    Minutes of Exercise per Session: 40 min  Stress: Stress Concern Present (02/08/2023)   [employer] of Occupational Health - Occupational Stress Questionnaire    Feeling of Stress : To some extent  Social Connections: Moderately Isolated (02/08/2023)   Social Connection and Isolation Panel [NHANES]    Frequency of Communication with Friends and Family: More than three times a week    Frequency of Social Gatherings with Friends and Family: Never    Attends Religious Services: Never    Database administrator or Organizations: No    Attends Engineer, structural: Not on file    Marital Status:  Married  Catering manager Violence: Not on file     Outpatient Medications Prior to Visit  Medication Sig Dispense Refill   albuterol (PROVENTIL) (2.5 MG/3ML) 0.083% nebulizer solution Take 3 mLs (2.5 mg total) by nebulization every 6 (six) hours as needed for wheezing or shortness of breath. 150 mL 2   albuterol (VENTOLIN HFA) 108 (90 Base) MCG/ACT inhaler INHALE 2 PUFFS INTO THE LUNGS EVERY 6 HOURS AS NEEDED FOR WHEEZING OR SHORTNESS OF BREATH. 6.7 g 1   azelastine (ASTELIN) 0.1 % nasal spray Place 1 spray into both nostrils 2 (two) times daily. Use in each nostril as directed 30 mL 3   budesonide-formoterol (SYMBICORT) 80-4.5 MCG/ACT inhaler Inhale 2 puffs into the lungs 2 (two) times daily. 10.2 g 3   cetirizine (ZYRTEC) 10 MG tablet Take 10 mg by mouth daily.     cholestyramine (QUESTRAN) 4 g packet Take 1 packet (4 g total) by mouth 2 (two) times daily. 60 each 11   fluticasone (FLONASE) 50 MCG/ACT nasal spray Place 2 sprays into both nostrils daily. 16 g 0   meloxicam (MOBIC) 15 MG tablet Take 1 tablet (15 mg total) by mouth daily as needed for pain. 30 tablet 1   amoxicillin-clavulanate (AUGMENTIN) 875-125 MG tablet Take 1 tablet by mouth 2 (two) times daily. (Patient not taking: Reported on 08/04/2023) 20 tablet 0   No facility-administered medications prior to visit.    Allergies  Allergen Reactions   Azithromycin Hives, Shortness Of Breath and Swelling    Throat swelling   Sulfa Antibiotics Hives   Ciprofloxacin Swelling   Moxifloxacin Hcl Hives   Bactrim [Sulfamethoxazole-Trimethoprim] Rash   Wound Dressing Adhesive Rash    Dermabond    ROS Review of Systems Negative unless indicated in HPI.    Objective:    Physical Exam HENT:     Right Ear: Tympanic membrane normal. Tympanic membrane is not erythematous.     Left Ear: Tympanic membrane normal. Tympanic membrane is not erythematous.     Nose:     Right Turbinates: Not enlarged.     Left Turbinates: Not  enlarged.     Right Sinus: No maxillary sinus tenderness or frontal sinus tenderness.     Left Sinus: No maxillary sinus tenderness or frontal sinus tenderness.     Mouth/Throat:     Mouth: Mucous membranes are moist.     Pharynx: No pharyngeal swelling, oropharyngeal exudate or posterior oropharyngeal erythema.     Tonsils: No tonsillar exudate.  Cardiovascular:     Rate and Rhythm: Normal rate and regular rhythm.  Pulmonary:     Effort: Pulmonary effort is normal.     Breath sounds:  No stridor. Wheezing present.  Neurological:     General: No focal deficit present.     Mental Status: She is oriented to person, place, and time. Mental status is at baseline.  Psychiatric:        Mood and Affect: Mood normal.        Behavior: Behavior normal.        Thought Content: Thought content normal.        Judgment: Judgment normal.     BP 112/80   Pulse 72   Temp 97.9 F (36.6 C) (Oral)   Ht 5\' 7"  (1.702 m)   Wt 205 lb 12.8 oz (93.4 kg)   LMP 07/17/2023   SpO2 98%   BMI 32.23 kg/m  Wt Readings from Last 3 Encounters:  08/04/23 205 lb 12.8 oz (93.4 kg)  06/30/23 196 lb 6.4 oz (89.1 kg)  04/13/23 192 lb 6.4 oz (87.3 kg)     Health Maintenance  Topic Date Due   COVID-19 Vaccine (3 - 2023-24 season) 08/20/2023 (Originally 07/16/2023)   INFLUENZA VACCINE  02/12/2024 (Originally 06/15/2023)   Cervical Cancer Screening (HPV/Pap Cotest)  08/10/2027   Colonoscopy  02/20/2028   DTaP/Tdap/Td (3 - Td or Tdap) 11/12/2031   Hepatitis C Screening  Completed   HPV VACCINES  Aged Out   HIV Screening  Discontinued    There are no preventive care reminders to display for this patient.  Lab Results  Component Value Date   TSH 0.955 06/30/2023   Lab Results  Component Value Date   WBC 10.9 (H) 08/04/2023   HGB 13.2 08/04/2023   HCT 41.5 08/04/2023   MCV 91.8 08/04/2023   PLT 335 08/04/2023   Lab Results  Component Value Date   NA 140 08/04/2023   K 4.4 08/04/2023   CO2 29  08/04/2023   GLUCOSE 89 08/04/2023   BUN 12 08/04/2023   CREATININE 0.62 08/04/2023   BILITOT 0.8 08/04/2023   ALKPHOS 73 06/30/2023   AST 20 08/04/2023   ALT 20 08/04/2023   PROT 6.4 08/04/2023   ALBUMIN 4.2 06/30/2023   CALCIUM 9.6 08/04/2023   ANIONGAP 7 06/30/2023   GFR 119.07 03/06/2023   Lab Results  Component Value Date   CHOL 172 08/09/2022   Lab Results  Component Value Date   HDL 58 08/09/2022   Lab Results  Component Value Date   LDLCALC 96 08/09/2022   Lab Results  Component Value Date   TRIG 99 08/09/2022   Lab Results  Component Value Date   CHOLHDL 3.0 08/09/2022   Lab Results  Component Value Date   HGBA1C 5.3 08/09/2022      Assessment & Plan:  URI with cough and congestion Assessment & Plan: Mild inspiratory wheezing. Will treat with doxycycline. F/u Chest x-ray and labs   Orders: -     DG Chest 2 View; Future -     CBC with Differential/Platelet -     Comprehensive metabolic panel    Follow-up: Return if symptoms worsen or fail to improve.   Kara Dies, NP

## 2023-08-05 LAB — COMPREHENSIVE METABOLIC PANEL
AG Ratio: 1.9 (calc) (ref 1.0–2.5)
ALT: 20 U/L (ref 6–29)
AST: 20 U/L (ref 10–30)
Albumin: 4.2 g/dL (ref 3.6–5.1)
Alkaline phosphatase (APISO): 70 U/L (ref 31–125)
BUN: 12 mg/dL (ref 7–25)
CO2: 29 mmol/L (ref 20–32)
Calcium: 9.6 mg/dL (ref 8.6–10.2)
Chloride: 105 mmol/L (ref 98–110)
Creat: 0.62 mg/dL (ref 0.50–0.97)
Globulin: 2.2 g/dL (calc) (ref 1.9–3.7)
Glucose, Bld: 89 mg/dL (ref 65–99)
Potassium: 4.4 mmol/L (ref 3.5–5.3)
Sodium: 140 mmol/L (ref 135–146)
Total Bilirubin: 0.8 mg/dL (ref 0.2–1.2)
Total Protein: 6.4 g/dL (ref 6.1–8.1)

## 2023-08-05 LAB — CBC WITH DIFFERENTIAL/PLATELET
Absolute Monocytes: 828 cells/uL (ref 200–950)
Basophils Absolute: 65 cells/uL (ref 0–200)
Basophils Relative: 0.6 %
Eosinophils Absolute: 305 cells/uL (ref 15–500)
Eosinophils Relative: 2.8 %
HCT: 41.5 % (ref 35.0–45.0)
Hemoglobin: 13.2 g/dL (ref 11.7–15.5)
Lymphs Abs: 3902 cells/uL — ABNORMAL HIGH (ref 850–3900)
MCH: 29.2 pg (ref 27.0–33.0)
MCHC: 31.8 g/dL — ABNORMAL LOW (ref 32.0–36.0)
MCV: 91.8 fL (ref 80.0–100.0)
MPV: 9.6 fL (ref 7.5–12.5)
Monocytes Relative: 7.6 %
Neutro Abs: 5799 cells/uL (ref 1500–7800)
Neutrophils Relative %: 53.2 %
Platelets: 335 10*3/uL (ref 140–400)
RBC: 4.52 10*6/uL (ref 3.80–5.10)
RDW: 12.4 % (ref 11.0–15.0)
Total Lymphocyte: 35.8 %
WBC: 10.9 10*3/uL — ABNORMAL HIGH (ref 3.8–10.8)

## 2023-08-10 ENCOUNTER — Encounter: Payer: Self-pay | Admitting: Nurse Practitioner

## 2023-08-11 DIAGNOSIS — J300 Vasomotor rhinitis: Secondary | ICD-10-CM | POA: Diagnosis not present

## 2023-08-11 DIAGNOSIS — J4530 Mild persistent asthma, uncomplicated: Secondary | ICD-10-CM | POA: Diagnosis not present

## 2023-08-11 DIAGNOSIS — H1045 Other chronic allergic conjunctivitis: Secondary | ICD-10-CM | POA: Diagnosis not present

## 2023-08-11 DIAGNOSIS — J3089 Other allergic rhinitis: Secondary | ICD-10-CM | POA: Diagnosis not present

## 2023-08-13 NOTE — Assessment & Plan Note (Signed)
Mild inspiratory wheezing. Will treat with doxycycline. F/u Chest x-ray and labs

## 2023-08-15 ENCOUNTER — Other Ambulatory Visit: Payer: Self-pay

## 2023-08-17 ENCOUNTER — Telehealth: Payer: 59 | Admitting: Nurse Practitioner

## 2023-08-17 DIAGNOSIS — G43809 Other migraine, not intractable, without status migrainosus: Secondary | ICD-10-CM

## 2023-08-17 MED ORDER — PREDNISONE 10 MG (21) PO TBPK
ORAL_TABLET | ORAL | 0 refills | Status: DC
Start: 2023-08-17 — End: 2023-08-25

## 2023-08-17 MED ORDER — METOCLOPRAMIDE HCL 5 MG PO TABS
5.0000 mg | ORAL_TABLET | Freq: Four times a day (QID) | ORAL | 0 refills | Status: AC | PRN
Start: 2023-08-17 — End: 2024-09-03

## 2023-08-17 NOTE — Progress Notes (Signed)
Virtual Visit Consent   Mary Richards, you are scheduled for a virtual visit with a  provider today. Just as with appointments in the office, your consent must be obtained to participate. Your consent will be active for this visit and any virtual visit you may have with one of our providers in the next 365 days. If you have a MyChart account, a copy of this consent can be sent to you electronically.  As this is a virtual visit, video technology does not allow for your provider to perform a traditional examination. This may limit your provider's ability to fully assess your condition. If your provider identifies any concerns that need to be evaluated in person or the need to arrange testing (such as labs, EKG, etc.), we will make arrangements to do so. Although advances in technology are sophisticated, we cannot ensure that it will always work on either your end or our end. If the connection with a video visit is poor, the visit may have to be switched to a telephone visit. With either a video or telephone visit, we are not always able to ensure that we have a secure connection.  By engaging in this virtual visit, you consent to the provision of healthcare and authorize for your insurance to be billed (if applicable) for the services provided during this visit. Depending on your insurance coverage, you may receive a charge related to this service.  I need to obtain your verbal consent now. Are you willing to proceed with your visit today? Mary Richards has provided verbal consent on 08/17/2023 for a virtual visit (video or telephone). Viviano Simas, FNP  Date: 08/17/2023 5:34 PM  Virtual Visit via Video Note   I, Viviano Simas, connected with  Mary Richards  ([id number], 07-23-1986) on 08/17/23 at  5:30 PM EDT by a video-enabled telemedicine application and verified that I am speaking with the correct person using two identifiers.  Location: Patient: Virtual Visit  Location Patient: Home Provider: Virtual Visit Location Provider: Home Office   I discussed the limitations of evaluation and management by telemedicine and the availability of in person appointments. The patient expressed understanding and agreed to proceed.    History of Present Illness: Mary Richards is a 37 y.o. who identifies as a female who was assigned female at birth, and is being seen today for a migraine.  Symptom onset was 2 hours ago   She has a history of migraines She has taken 800mg  ibuprofen & MaxAlt without relief   When she bends forward she has increased pounding in her head   She has needed to go to the ED in the past for vomiting from a migraine   This happens about 1-2x a year she does not have a prescription for migraine medications   She was treated for a sinus infection last month and required three rounds of antibiotics  Feels she has improved somewhat but continues to have pressure in her sinuses  She has been on two rounds of steroids in the past few months for the treatment of her sinus infections  She is scheduled to see ENT on 08/22/23  She has continued Xyzal and flonase   When she has had migraines in the past she has not found anything useful aside from ER visit for migraine "cocktail"  She does not respond well to Zofran  Phenergan causes increased sedation for her and she cares for her 20 month old   She has responded well  to reglan in the past   Problems:  Patient Active Problem List   Diagnosis Date Noted   Vertigo 06/30/2023   Sinusitis 06/30/2023   Respiratory illness 04/13/2023   Pneumonia 03/06/2023   Persistent cough 02/16/2023   Urticaria 02/08/2023   Back pain 01/27/2023   Intractable migraine 09/02/2022   Dysuria 07/28/2022   URI with cough and congestion 05/23/2022   History of urinary stone 04/24/2022   Irritable bowel syndrome without diarrhea 04/24/2022   Irritant contact dermatitis, unspecified cause  04/24/2022   Asthma during pregnancy 05/28/2021   Routine physical examination 01/11/2021   Bilateral impacted cerumen 06/05/2020   Obesity (BMI 30-39.9) 04/29/2020   GAD (generalized anxiety disorder) 07/08/2019   Chronic low back pain 07/08/2019    Allergies:  Allergies  Allergen Reactions   Azithromycin Hives, Shortness Of Breath and Swelling    Throat swelling   Sulfa Antibiotics Hives   Ciprofloxacin Swelling   Moxifloxacin Hcl Hives   Bactrim [Sulfamethoxazole-Trimethoprim] Rash   Wound Dressing Adhesive Rash    Dermabond   Medications:   Current Outpatient Medications:    albuterol (PROVENTIL) (2.5 MG/3ML) 0.083% nebulizer solution, Take 3 mLs (2.5 mg total) by nebulization every 6 (six) hours as needed for wheezing or shortness of breath., Disp: 150 mL, Rfl: 2   albuterol (VENTOLIN HFA) 108 (90 Base) MCG/ACT inhaler, INHALE 2 PUFFS INTO THE LUNGS EVERY 6 HOURS AS NEEDED FOR WHEEZING OR SHORTNESS OF BREATH., Disp: 6.7 g, Rfl: 1   azelastine (ASTELIN) 0.1 % nasal spray, Place 1 spray into both nostrils 2 (two) times daily. Use in each nostril as directed, Disp: 30 mL, Rfl: 3   budesonide-formoterol (SYMBICORT) 80-4.5 MCG/ACT inhaler, Inhale 2 puffs into the lungs 2 (two) times daily., Disp: 10.2 g, Rfl: 3   cetirizine (ZYRTEC) 10 MG tablet, Take 10 mg by mouth daily., Disp: , Rfl:    cholestyramine (QUESTRAN) 4 g packet, Take 1 packet (4 g total) by mouth 2 (two) times daily., Disp: 60 each, Rfl: 11   fluticasone (FLONASE) 50 MCG/ACT nasal spray, Place 2 sprays into both nostrils daily., Disp: 16 g, Rfl: 0   meloxicam (MOBIC) 15 MG tablet, Take 1 tablet (15 mg total) by mouth daily as needed for pain., Disp: 30 tablet, Rfl: 1   Observations/Objective: Patient is well-developed, well-nourished in no acute distress.  Resting comfortably  at home.  Head is normocephalic, atraumatic.  No labored breathing.  Speech is clear and coherent with logical content.  Patient is alert  and oriented at baseline.    Assessment and Plan:  1. Other migraine without status migrainosus, not intractable  Likely secondary to ongoing sinusitis pressure  Follow up scheduled with ENT in 5 days  If pain worsens or unable to tolerate oral medications advised UC or ED for IM options for treatment   Meds ordered this encounter  Medications   metoCLOPramide (REGLAN) 5 MG tablet    Sig: Take 1 tablet (5 mg total) by mouth every 6 (six) hours as needed for up to 5 days for nausea.    Dispense:  20 tablet    Refill:  0   predniSONE (STERAPRED UNI-PAK 21 TAB) 10 MG (21) TBPK tablet    Sig: Take 6 tablets on day one, 5 on day two, 4 on day three, 3 on day four, 2 on day five, and 1 on day six. Take with food.    Dispense:  21 tablet    Refill:  0  Follow Up Instructions: I discussed the assessment and treatment plan with the patient. The patient was provided an opportunity to ask questions and all were answered. The patient agreed with the plan and demonstrated an understanding of the instructions.  A copy of instructions were sent to the patient via MyChart unless otherwise noted below.    The patient was advised to call back or seek an in-person evaluation if the symptoms worsen or if the condition fails to improve as anticipated.  Time:  I spent 15 minutes with the patient via telehealth technology discussing the above problems/concerns.    Viviano Simas, FNP

## 2023-08-18 ENCOUNTER — Ambulatory Visit
Admission: EM | Admit: 2023-08-18 | Discharge: 2023-08-18 | Disposition: A | Discharge status: Home / Self Care | Payer: 59 | Attending: Emergency Medicine | Admitting: Emergency Medicine

## 2023-08-18 DIAGNOSIS — G43901 Migraine, unspecified, not intractable, with status migrainosus: Secondary | ICD-10-CM

## 2023-08-18 MED ORDER — DEXAMETHASONE SODIUM PHOSPHATE 10 MG/ML IJ SOLN
10.0000 mg | Freq: Once | INTRAMUSCULAR | Status: AC
  Administered 2023-08-18: 10 mg via INTRAMUSCULAR

## 2023-08-18 MED ORDER — KETOROLAC TROMETHAMINE 30 MG/ML IJ SOLN
60.0000 mg | Freq: Once | INTRAMUSCULAR | Status: AC
  Administered 2023-08-18: 60 mg via INTRAMUSCULAR

## 2023-08-18 NOTE — ED Triage Notes (Signed)
Pt states headache and nausea for the past 3 days. States she took a Reglan Tylenol and a muscle relaxer  at home with some relief.

## 2023-08-18 NOTE — Discharge Instructions (Signed)
Toradol and Decadron injections given today. Can continue to take muscle relaxer and Reglan to help with muscle tightness and nausea. Can also take Tylenol if needed. Recommend waiting until tomorrow morning to take ibuprofen. Follow-up with PCP or neurology. Go to the ER if worsening or no improvement in 24 hours.

## 2023-08-18 NOTE — ED Provider Notes (Signed)
[facility]    CSN: [id number] Arrival date & time: 08/18/23  1435      History   Chief Complaint Chief Complaint  Patient presents with   Headache    HPI Mary Richards is a 37 y.o. female.   Patient presents with concerns of headache for the past 3 days. She reports the pain is primarily in her temples and wraps around to the sides and back of her head. The pain is causing her neck and upper back muscles to be tight and sore as well. She reports prior similar, about a year ago, that resolved with Toradol, Benadryl, and Reglan injections. She saw neurology shortly after that and took a short course of amitriptyline but hasn't had any recurrent headaches or issues since then. She reports nausea but no vomiting. She has been taking Reglan and Robaxin with some temporary improvement. She denies dizziness or any head injury. The patient reports sensitivity to sound but not light.   The history is provided by the patient.  Headache Associated symptoms: nausea   Associated symptoms: no dizziness, no fatigue, no fever, no neck pain, no neck stiffness, no photophobia, no vomiting and no weakness     Past Medical History:  Diagnosis Date   Asthma    Chronic migraine    Complication of anesthesia    DDD (degenerative disc disease), lumbosacral    Family history of adverse reaction to anesthesia    Mother and Father - PONV   PONV (postoperative nausea and vomiting)    Ruptured left tubal ectopic pregnancy causing hemoperitoneum 01/23/2021   Wears contact lenses     Patient Active Problem List   Diagnosis Date Noted   Vertigo 06/30/2023   Sinusitis 06/30/2023   Respiratory illness 04/13/2023   Pneumonia 03/06/2023   Persistent cough 02/16/2023   Urticaria 02/08/2023   Back pain 01/27/2023   Intractable migraine 09/02/2022   Dysuria 07/28/2022   URI with cough and congestion 05/23/2022   History of urinary stone 04/24/2022   Irritable bowel syndrome without  diarrhea 04/24/2022   Irritant contact dermatitis, unspecified cause 04/24/2022   Asthma during pregnancy 05/28/2021   Routine physical examination 01/11/2021   Bilateral impacted cerumen 06/05/2020   Obesity (BMI 30-39.9) 04/29/2020   GAD (generalized anxiety disorder) 07/08/2019   Chronic low back pain 07/08/2019    Past Surgical History:  Procedure Laterality Date   CHOLECYSTECTOMY     COLONOSCOPY WITH PROPOFOL N/A 02/19/2021   Procedure: COLONOSCOPY WITH PROPOFOL;  Surgeon: Wyline Mood, MD;  Location: [facility] ENDOSCOPY;  Service: Gastroenterology;  Laterality: N/A;   DIAGNOSTIC LAPAROSCOPY WITH REMOVAL OF ECTOPIC PREGNANCY Left 01/23/2021   Procedure: DIAGNOSTIC LAPAROSCOPY WITH REMOVAL OF ECTOPIC PREGNANCY;  Surgeon: Conard Novak, MD;  Location: [facility] ORS;  Service: Gynecology;  Laterality: Left;   PILONIDAL CYST EXCISION     TONSILECTOMY, ADENOIDECTOMY, BILATERAL MYRINGOTOMY AND TUBES     TYMPANOPLASTY Right 05/10/2019   Procedure: TYMPANOPLASTY;  Surgeon: Linus Salmons, MD;  Location: [facility];  Service: ENT;  Laterality: Right;    OB History     Gravida  3   Para  2   Term  2   Preterm      AB  1   Living  2      SAB      IAB      Ectopic  1   Multiple  0   Live Births  2  Home Medications    Prior to Admission medications   Medication Sig Start Date End Date Taking? Authorizing Provider  albuterol (PROVENTIL) (2.5 MG/3ML) 0.083% nebulizer solution Take 3 mLs (2.5 mg total) by nebulization every 6 (six) hours as needed for wheezing or shortness of breath. 02/24/23   Allegra Grana, FNP  albuterol (VENTOLIN HFA) 108 (90 Base) MCG/ACT inhaler INHALE 2 PUFFS INTO THE LUNGS EVERY 6 HOURS AS NEEDED FOR WHEEZING OR SHORTNESS OF BREATH. 11/30/22 11/30/23  Allegra Grana, FNP  azelastine (ASTELIN) 0.1 % nasal spray Place 1 spray into both nostrils 2 (two) times daily. Use in each nostril as directed 12/14/21   Allegra Grana,  FNP  budesonide-formoterol (SYMBICORT) 80-4.5 MCG/ACT inhaler Inhale 2 puffs into the lungs 2 (two) times daily. 06/30/23   Allegra Grana, FNP  cetirizine (ZYRTEC) 10 MG tablet Take 10 mg by mouth daily.    [provider]  cholestyramine (QUESTRAN) 4 g packet Take 1 packet (4 g total) by mouth 2 (two) times daily. 06/28/22   Midge Minium, MD  fluticasone (FLONASE) 50 MCG/ACT nasal spray Place 2 sprays into both nostrils daily. 01/31/23   Allegra Grana, FNP  meloxicam (MOBIC) 15 MG tablet Take 1 tablet (15 mg total) by mouth daily as needed for pain. 01/27/23   Allegra Grana, FNP  metoCLOPramide (REGLAN) 5 MG tablet Take 1 tablet (5 mg total) by mouth every 6 (six) hours as needed for up to 5 days for nausea. 08/17/23 08/22/23  Viviano Simas, FNP  predniSONE (STERAPRED UNI-PAK 21 TAB) 10 MG (21) TBPK tablet Take 6 tablets on day one, 5 on day two, 4 on day three, 3 on day four, 2 on day five, and 1 on day six. Take with food. 08/17/23   Viviano Simas, FNP    Family History Family History  Problem Relation Age of Onset   Irritable bowel syndrome Mother    Brain cancer Father 44       Glioblastoma, died one year after dx   Valvular heart disease Sister    COPD Maternal Grandfather    Brain cancer Paternal Grandmother 26       unsure what type, she was not treated   Thyroid cancer Neg Hx    Colon cancer Neg Hx    Breast cancer Neg Hx     Social History Social History   Tobacco Use   Smoking status: Never   Smokeless tobacco: Never  Vaping Use   Vaping status: Never Used  Substance Use Topics   Alcohol use: Yes    Alcohol/week: 2.0 standard drinks of alcohol    Types: 2 Standard drinks or equivalent per week    Comment: Social    Drug use: Never     Allergies   Azithromycin, Sulfa antibiotics, Ciprofloxacin, Moxifloxacin hcl, Bactrim [sulfamethoxazole-trimethoprim], and Wound dressing adhesive   Review of Systems Review of Systems  Constitutional:   Negative for fatigue and fever.  Eyes:  Negative for photophobia and visual disturbance.  Respiratory:  Negative for shortness of breath.   Cardiovascular:  Negative for chest pain.  Gastrointestinal:  Positive for nausea. Negative for vomiting.  Musculoskeletal:  Negative for neck pain and neck stiffness.  Skin:  Negative for rash.  Neurological:  Positive for headaches. Negative for dizziness, syncope and weakness.     Physical Exam Triage Vital Signs ED Triage Vitals  Encounter Vitals Group     BP 08/18/23 1439 130/88     Systolic  BP Percentile --      Diastolic BP Percentile --      Pulse Rate 08/18/23 1439 77     Resp 08/18/23 1439 16     Temp 08/18/23 1439 97.6 F (36.4 C)     Temp Source 08/18/23 1439 Oral     SpO2 08/18/23 1439 99 %     Weight --      Height --      Head Circumference --      Peak Flow --      Pain Score 08/18/23 1442 8     Pain Loc --      Pain Education --      Exclude from Growth Chart --    No data found.  Updated Vital Signs BP 130/88 (BP Location: Left Arm)   Pulse 77   Temp 97.6 F (36.4 C) (Oral)   Resp 16   LMP 08/15/2023 (Approximate)   SpO2 99%   Breastfeeding No   Visual Acuity Right Eye Distance:   Left Eye Distance:   Bilateral Distance:    Right Eye Near:   Left Eye Near:    Bilateral Near:     Physical Exam Vitals and nursing note reviewed.  Constitutional:      General: She is not in acute distress. HENT:     Head: Normocephalic and atraumatic.     Comments: Mild tenderness to bilateral temples into lateral head and upper neck musculature Eyes:     Extraocular Movements: Extraocular movements intact.     Conjunctiva/sclera: Conjunctivae normal.     Pupils: Pupils are equal, round, and reactive to light.  Cardiovascular:     Rate and Rhythm: Normal rate and regular rhythm.     Heart sounds: Normal heart sounds.  Pulmonary:     Effort: Pulmonary effort is normal.     Breath sounds: Normal breath sounds.   Musculoskeletal:     Cervical back: Normal range of motion. No rigidity.  Neurological:     General: No focal deficit present.     Mental Status: She is alert.  Psychiatric:        Mood and Affect: Mood normal.      UC Treatments / Results  Labs (all labs ordered are listed, but only abnormal results are displayed) Labs Reviewed - No data to display  EKG   Radiology No results found.  Procedures Procedures (including critical care time)  Medications Ordered in UC Medications  ketorolac (TORADOL) 30 MG/ML injection 60 mg (60 mg Intramuscular Given 08/18/23 1510)  dexamethasone (DECADRON) injection 10 mg (10 mg Intramuscular Given 08/18/23 1510)    Initial Impression / Assessment and Plan / UC Course  I have reviewed the triage vital signs and the nursing notes.  Pertinent labs & imaging results that were available during my care of the patient were reviewed by me and considered in my medical decision making (see chart for details).     Sx consistent with migraine, similar to past episodes. No evidence of meningitis or other emergent cause. Toradol IM for pain/migraine break and Decadron IM to help prevent recurrence.Pt to continue with Reglan and Robaxin she has at home. Discussed follow-up and ER precautions.   E/M: 1 acute uncomplicated illness, no data, moderate risk due to prescription management (injections, discussing home medications to continue)   Final Clinical Impressions(s) / UC Diagnoses   Final diagnoses:  Migraine with status migrainosus, not intractable, unspecified migraine type     Discharge Instructions  Toradol and Decadron injections given today. Can continue to take muscle relaxer and Reglan to help with muscle tightness and nausea. Can also take Tylenol if needed. Recommend waiting until tomorrow morning to take ibuprofen. Follow-up with PCP or neurology. Go to the ER if worsening or no improvement in 24 hours.      ED Prescriptions    None    PDMP not reviewed this encounter.   Estanislado Pandy, [place] 08/18/23 [phone]

## 2023-08-21 ENCOUNTER — Telehealth: Payer: 59 | Admitting: Emergency Medicine

## 2023-08-21 ENCOUNTER — Other Ambulatory Visit: Payer: Self-pay

## 2023-08-21 DIAGNOSIS — R300 Dysuria: Secondary | ICD-10-CM | POA: Diagnosis not present

## 2023-08-21 MED ORDER — NITROFURANTOIN MONOHYD MACRO 100 MG PO CAPS
100.0000 mg | ORAL_CAPSULE | Freq: Two times a day (BID) | ORAL | 0 refills | Status: DC
  Filled 2023-08-21: qty 10, 5d supply, fill #0

## 2023-08-21 NOTE — Progress Notes (Signed)
E-Visit for Urinary Problems ° °We are sorry that you are not feeling well.  Here is how we plan to help! ° °Based on what you shared with me it looks like you most likely have a simple urinary tract infection. ° °A UTI (Urinary Tract Infection) is a bacterial infection of the bladder. ° °Most cases of urinary tract infections are simple to treat but a key part of your care is to encourage you to drink plenty of fluids and watch your symptoms carefully. ° °I have prescribed MacroBid 100 mg twice a day for 5 days.  Your symptoms should gradually improve. Call us if the burning in your urine worsens, you develop worsening fever, back pain or pelvic pain or if your symptoms do not resolve after completing the antibiotic. ° °Urinary tract infections can be prevented by drinking plenty of water to keep your body hydrated.  Also be sure when you wipe, wipe from front to back and don't hold it in!  If possible, empty your bladder every 4 hours. ° °HOME CARE °Drink plenty of fluids °Compete the full course of the antibiotics even if the symptoms resolve °Remember, when you need to go…go. Holding in your urine can increase the likelihood of getting a UTI! °GET HELP RIGHT AWAY IF: °You cannot urinate °You get a high fever °Worsening back pain occurs °You see blood in your urine °You feel sick to your stomach or throw up °You feel like you are going to pass out ° °MAKE SURE YOU  °Understand these instructions. °Will watch your condition. °Will get help right away if you are not doing well or get worse. ° ° °Thank you for choosing an e-visit. ° °Your e-visit answers were reviewed by a board certified advanced clinical practitioner to complete your personal care plan. Depending upon the condition, your plan could have included both over the counter or prescription medications. ° °Please review your pharmacy choice. Make sure the pharmacy is open so you can pick up prescription now. If there is a problem, you may contact your  provider through MyChart messaging and have the prescription routed to another pharmacy.  Your safety is important to us. If you have drug allergies check your prescription carefully.  ° °For the next 24 hours you can use MyChart to ask questions about today's visit, request a non-urgent call back, or ask for a work or school excuse. °You will get an email in the next two days asking about your experience. I hope that your e-visit has been valuable and will speed your recovery. ° °Approximately 5 minutes was used in reviewing the patient's chart, questionnaire, prescribing medications, and documentation. ° °

## 2023-08-24 ENCOUNTER — Other Ambulatory Visit: Payer: Self-pay

## 2023-08-24 ENCOUNTER — Ambulatory Visit: Payer: 59 | Admitting: Family

## 2023-08-24 ENCOUNTER — Encounter: Payer: Self-pay | Admitting: Family

## 2023-08-24 VITALS — BP 116/84 | HR 91 | Temp 98.1°F | Ht 67.0 in | Wt 203.0 lb

## 2023-08-24 DIAGNOSIS — H81399 Other peripheral vertigo, unspecified ear: Secondary | ICD-10-CM | POA: Diagnosis not present

## 2023-08-24 DIAGNOSIS — L509 Urticaria, unspecified: Secondary | ICD-10-CM | POA: Diagnosis not present

## 2023-08-24 DIAGNOSIS — R300 Dysuria: Secondary | ICD-10-CM

## 2023-08-24 DIAGNOSIS — G43011 Migraine without aura, intractable, with status migrainosus: Secondary | ICD-10-CM | POA: Diagnosis not present

## 2023-08-24 MED ORDER — NORTRIPTYLINE HCL 25 MG PO CAPS
25.0000 mg | ORAL_CAPSULE | Freq: Every day | ORAL | 1 refills | Status: DC
Start: 2023-08-24 — End: 2024-07-17
  Filled 2023-08-24: qty 90, 90d supply, fill #0

## 2023-08-24 NOTE — Patient Instructions (Addendum)
Stop macrobid in setting of suspected hives. We will look at urine.   Continue benadryl and zyrtec.   We will request results from allergy.   Assuming you have been taking nortriptyline 20mg  nightly, we will increase to 25mg  and stay there for maintenance ( or until follow up).   Please call me of you have a difference dose of nortriptyline.

## 2023-08-24 NOTE — Assessment & Plan Note (Signed)
Patient presents with complicated migraine.  Reassuring neurologic exam.  question if cervicogenic in nature.  Previously consultedw ith neurology and patient had been on amitriptyline 20 mg.  She restarted this week.  Advised her she may go ahead and increase to amitriptyline 25 mg nightly and the opt to increase to 50 mg nightly for treatment of pain and headache prevention.  In the setting of pulsatile tinnitus, vertigo, I have ordered MRI brain, with IAC and MRA head. Close follow up.

## 2023-08-24 NOTE — Assessment & Plan Note (Signed)
Resolved at this time.  Advised patient the setting of hives which appeared this morning , we agreed to stop Macrobid of abundance of caution.  Pending urinalysis, urine culture.

## 2023-08-24 NOTE — Assessment & Plan Note (Addendum)
Episodic urticaria; question if viral in nature.  Extensive antibiotic allergies.  Of note, Previously been seen by allergy.  Patient states allergist has tested for PCN allergy and it is safe for her to take PCN.  Requesting his records today
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
## 2023-08-25 ENCOUNTER — Emergency Department
Admission: EM | Admit: 2023-08-25 | Discharge: 2023-08-25 | Disposition: A | Discharge status: Home / Self Care | Payer: 59 | Attending: Emergency Medicine | Admitting: Emergency Medicine

## 2023-08-25 ENCOUNTER — Other Ambulatory Visit: Payer: Self-pay

## 2023-08-25 ENCOUNTER — Telehealth: Payer: Self-pay | Admitting: Family

## 2023-08-25 ENCOUNTER — Encounter: Payer: Self-pay | Admitting: Emergency Medicine

## 2023-08-25 ENCOUNTER — Emergency Department: Payer: 59

## 2023-08-25 ENCOUNTER — Ambulatory Visit: Payer: 59 | Admitting: Family

## 2023-08-25 VITALS — BP 108/60 | HR 90 | Temp 97.9°F | Ht 67.0 in | Wt 200.0 lb

## 2023-08-25 DIAGNOSIS — S0990XA Unspecified injury of head, initial encounter: Secondary | ICD-10-CM | POA: Diagnosis not present

## 2023-08-25 DIAGNOSIS — R0902 Hypoxemia: Secondary | ICD-10-CM | POA: Diagnosis not present

## 2023-08-25 DIAGNOSIS — L509 Urticaria, unspecified: Secondary | ICD-10-CM | POA: Diagnosis not present

## 2023-08-25 DIAGNOSIS — S199XXA Unspecified injury of neck, initial encounter: Secondary | ICD-10-CM | POA: Diagnosis not present

## 2023-08-25 DIAGNOSIS — W01198A Fall on same level from slipping, tripping and stumbling with subsequent striking against other object, initial encounter: Secondary | ICD-10-CM | POA: Diagnosis not present

## 2023-08-25 DIAGNOSIS — Z981 Arthrodesis status: Secondary | ICD-10-CM | POA: Diagnosis not present

## 2023-08-25 DIAGNOSIS — S161XXA Strain of muscle, fascia and tendon at neck level, initial encounter: Secondary | ICD-10-CM | POA: Diagnosis not present

## 2023-08-25 DIAGNOSIS — R55 Syncope and collapse: Secondary | ICD-10-CM

## 2023-08-25 DIAGNOSIS — T782XXA Anaphylactic shock, unspecified, initial encounter: Secondary | ICD-10-CM | POA: Diagnosis not present

## 2023-08-25 DIAGNOSIS — T7840XA Allergy, unspecified, initial encounter: Secondary | ICD-10-CM | POA: Diagnosis not present

## 2023-08-25 DIAGNOSIS — R519 Headache, unspecified: Secondary | ICD-10-CM | POA: Diagnosis not present

## 2023-08-25 LAB — CBC
HCT: 47.7 % — ABNORMAL HIGH (ref 36.0–46.0)
Hemoglobin: 15.1 g/dL — ABNORMAL HIGH (ref 12.0–15.0)
MCH: 29 pg (ref 26.0–34.0)
MCHC: 31.7 g/dL (ref 30.0–36.0)
MCV: 91.6 fL (ref 80.0–100.0)
Platelets: 397 10*3/uL (ref 150–400)
RBC: 5.21 MIL/uL — ABNORMAL HIGH (ref 3.87–5.11)
RDW: 13.1 % (ref 11.5–15.5)
WBC: 16.5 10*3/uL — ABNORMAL HIGH (ref 4.0–10.5)
nRBC: 0 % (ref 0.0–0.2)

## 2023-08-25 LAB — MAGNESIUM: Magnesium: 2.2 mg/dL (ref 1.7–2.4)

## 2023-08-25 LAB — BASIC METABOLIC PANEL
Anion gap: 8 (ref 5–15)
BUN: 15 mg/dL (ref 6–20)
CO2: 23 mmol/L (ref 22–32)
Calcium: 8.7 mg/dL — ABNORMAL LOW (ref 8.9–10.3)
Chloride: 104 mmol/L (ref 98–111)
Creatinine, Ser: 0.68 mg/dL (ref 0.44–1.00)
GFR, Estimated: >60 mL/min (ref >60)
Glucose, Bld: 103 mg/dL — ABNORMAL HIGH (ref 70–99)
Potassium: 5.4 mmol/L — ABNORMAL HIGH (ref 3.5–5.1)
Sodium: 135 mmol/L (ref 135–145)

## 2023-08-25 LAB — CBC WITH DIFFERENTIAL/PLATELET
Abs Immature Granulocytes: 0.23 10*3/uL — ABNORMAL HIGH (ref 0.00–0.07)
Basophils Absolute: 0.1 10*3/uL (ref 0.0–0.1)
Basophils Relative: 0 %
Eosinophils Absolute: 0 10*3/uL (ref 0.0–0.5)
Eosinophils Relative: 0 %
HCT: 45.4 % (ref 36.0–46.0)
Hemoglobin: 15.1 g/dL — ABNORMAL HIGH (ref 12.0–15.0)
Immature Granulocytes: 1 %
Lymphocytes Relative: 10 %
Lymphs Abs: 1.7 10*3/uL (ref 0.7–4.0)
MCH: 29.8 pg (ref 26.0–34.0)
MCHC: 33.3 g/dL (ref 30.0–36.0)
MCV: 89.5 fL (ref 80.0–100.0)
Monocytes Absolute: 0.4 10*3/uL (ref 0.1–1.0)
Monocytes Relative: 2 %
Neutro Abs: 13.8 10*3/uL — ABNORMAL HIGH (ref 1.7–7.7)
Neutrophils Relative %: 87 %
Platelets: 397 10*3/uL (ref 150–400)
RBC: 5.07 MIL/uL (ref 3.87–5.11)
RDW: 13.2 % (ref 11.5–15.5)
WBC: 16.2 10*3/uL — ABNORMAL HIGH (ref 4.0–10.5)
nRBC: 0 % (ref 0.0–0.2)

## 2023-08-25 LAB — URINALYSIS, ROUTINE W REFLEX MICROSCOPIC
Bilirubin Urine: NEGATIVE
Hgb urine dipstick: NEGATIVE
Ketones, ur: NEGATIVE
Leukocytes,Ua: NEGATIVE
Nitrite: NEGATIVE
RBC / HPF: NONE SEEN (ref 0–?)
Specific Gravity, Urine: 1.02 (ref 1.000–1.030)
Total Protein, Urine: NEGATIVE
Urine Glucose: NEGATIVE
Urobilinogen, UA: 0.2 (ref 0.0–1.0)
WBC, UA: NONE SEEN (ref 0–?)
pH: 6 (ref 5.0–8.0)

## 2023-08-25 LAB — URINE CULTURE
MICRO NUMBER:: 15578990
SPECIMEN QUALITY:: ADEQUATE

## 2023-08-25 MED ORDER — TRIAMCINOLONE ACETONIDE 0.1 % EX CREA
1.0000 | TOPICAL_CREAM | Freq: Two times a day (BID) | CUTANEOUS | 1 refills | Status: DC
Start: 2023-08-25 — End: 2024-09-03
  Filled 2023-08-25: qty 30, 30d supply, fill #0

## 2023-08-25 MED ORDER — FAMOTIDINE IN NACL 20-0.9 MG/50ML-% IV SOLN
20.0000 mg | Freq: Once | INTRAVENOUS | Status: AC
  Administered 2023-08-25: 20 mg via INTRAVENOUS
  Filled 2023-08-25: qty 50

## 2023-08-25 MED ORDER — DIPHENHYDRAMINE HCL 50 MG/ML IJ SOLN
25.0000 mg | Freq: Once | INTRAMUSCULAR | Status: AC
  Administered 2023-08-25: 25 mg via INTRAVENOUS
  Filled 2023-08-25: qty 1

## 2023-08-25 MED ORDER — ACETAMINOPHEN 500 MG PO TABS
1000.0000 mg | ORAL_TABLET | Freq: Once | ORAL | Status: AC
  Administered 2023-08-25: 1000 mg via ORAL
  Filled 2023-08-25: qty 2

## 2023-08-25 MED ORDER — PREDNISONE 10 MG PO TABS
ORAL_TABLET | ORAL | 0 refills | Status: AC
Start: 2023-08-25 — End: 2023-09-02
  Filled 2023-08-25: qty 20, 8d supply, fill #0

## 2023-08-25 MED ORDER — HYDROXYZINE HCL 25 MG PO TABS
25.0000 mg | ORAL_TABLET | Freq: Every day | ORAL | 0 refills | Status: AC
Start: 2023-08-25 — End: ?
  Filled 2023-08-25: qty 30, 30d supply, fill #0

## 2023-08-25 MED ORDER — METHYLPREDNISOLONE ACETATE 40 MG/ML IJ SUSP
40.0000 mg | Freq: Once | INTRAMUSCULAR | Status: AC
Start: 2023-08-25 — End: 2023-08-25
  Administered 2023-08-25: 40 mg via INTRAMUSCULAR

## 2023-08-25 MED ORDER — CYCLOBENZAPRINE HCL 5 MG PO TABS
5.0000 mg | ORAL_TABLET | Freq: Three times a day (TID) | ORAL | 0 refills | Status: DC | PRN
  Filled 2023-08-25: qty 30, 10d supply, fill #0

## 2023-08-25 MED ORDER — KETOROLAC TROMETHAMINE 15 MG/ML IJ SOLN
15.0000 mg | Freq: Once | INTRAMUSCULAR | Status: AC
  Administered 2023-08-25: 15 mg via INTRAVENOUS
  Filled 2023-08-25: qty 1

## 2023-08-25 NOTE — ED Triage Notes (Signed)
Patient sent from UC where she was being seen for allergic reaction to macrobid for syncopal episode. Patient reports pain to head that radiates down neck. Patient not on blood thinners

## 2023-08-25 NOTE — Discharge Instructions (Signed)
You were seen in the emergency department after an episode of passing out.  You had a CT scan done of your head and your neck that did not show any broken bones or internal bleeding.  You had an elevated white count on your lab work today.  You did have findings concerning for sinusitis on your CT scan however you did not have any symptoms of sinusitis.  You could have an elevated white count from your syncope and trauma today.  It is important that you follow-up closely and call your primary care physician today to schedule an appointment for reevaluation.  You likely need to get back in with your allergy specialist for your recurrent episodes of hives.  You need repeat lab work done early next week.  Return to the emergency department if you have any fever, headache or worsening symptoms.  Your primary care physician sent you in a prescription for nondrowsy Benadryl and for steroids.  Today you were given a prescription for muscle relaxer.  Pain control:  Ibuprofen (motrin/aleve/advil) - You can take 3 tablets (600 mg) every 6 hours as needed for pain/fever.  Acetaminophen (tylenol) - You can take 2 extra strength tablets (1000 mg) every 6 hours as needed for pain/fever.  You can alternate these medications or take them together.  Make sure you eat food/drink water when taking these medications.  Cyclbenzoprine (Flexeril) - You can take 1/2 to 1 tablet (5-10 mg) every 8 hours as needed for muscle strain/spasm.  Do not drive or work on this medication.  This medication can cause you to feel tired.  Thank you for choosing [place] for your health care, it was my pleasure to care for you today!  Corena Herter, MD

## 2023-08-25 NOTE — Progress Notes (Signed)
RN Scientist, research (physical sciences) responded to patient collapse advised by [employer]. Patient on floor was reported standing at check out and suddenly collapsed and fell by onto floor. CMA was at side and provider attaining vitals went over and held patient head and check for injury noted tenderness to head and patient complaint of neck pain immediately mobilized head and advised patient not to move head. EMS was called continued to hold patient head until EMS arrived and placed collar to immobile neck and head. Vitals to be reported by CMA.

## 2023-08-25 NOTE — Telephone Encounter (Signed)
Patient's husband came into the office . His wife had an appointment here today. She passed out at check out and was sent to the hospital. Hospital found that her white blood count was elevated. [facility] would like patient to have her white blood count check by provider. Appointment made for 11:30 on Wednesday.

## 2023-08-25 NOTE — Assessment & Plan Note (Addendum)
As patient was walking to check out of our office,she suffered an unwitnessed syncope and collapse.  Staff ran to get provider, staff and myself immediately surrounded patient.  When I came to patient she was lying supine on the floor, and her eyes were open.Palpable radial pulse.  She was able to respond to questions as asked if she was okay, she would nod or speak quietly.     1 or 2 minutes later, patient was able to talk and asking questions with me such as ' What happened? ' 'Why am I laying on the floor?' 'My head hurts.' We didn't move patient and kept her immobile while we obtained vitals and staff called 911.  Patient states that she was feeling somewhat lightheaded as she went to the checkout window.  She stated vision did become blurry.  She was able to squeeze she suspects squeeze hands bilaterally, wiggle bilateral fingers and toes.  Vitals were taken while patient was lying on the floor 142/82, oxygen 98%, heart rate 80. I called husband to make him aware of what happened; he described that patient has had syncopal episodes in the past.  Husband is currently in [place] and will drive to [facility] emergency room to meet his wife . Patient did also tell me that she did not drink much water this morning.  Certainly question if vasovagal syncope.  I have given triage report to [facility] ED RN.  Discussed rule out trauma, labs. I did make triage nurse aware that MRI brain, MRA head had previously been ordered and scheduled for next week if appropriate to have these also done in the emergency setting.

## 2023-08-25 NOTE — Progress Notes (Signed)
Assessment & Plan:  Urticaria Assessment & Plan: No evidence of cardiovascular compromise.  Tongue is not swollen.  Lips appear to be slightly swollen from yesterday when I saw patient for urticaria, migraine.  She is managing her secretions.  As she has been unresponsive to Zyrtec 10 mg, I have advised her to increase to 20 mg every day for the next week.  Provided Depo-Medrol 40 mg IM while in office due to lip swelling and prescribed prednisone taper.  Advised her to stop Benadryl and to start Atarax 25 mg nightly.  Plan to collaborate with allergist, Dr Braxton Callas, possibly arrange dermatologist consult for second opinion in setting of recurrent urticaria , unknown cause.   Orders: -     Triamcinolone Acetonide; Apply 1 Application topically 2 (two) times daily to areas of hives  Dispense: 30 g; Refill: 1 -     hydrOXYzine HCl; Take 1 tablet (25 mg total) by mouth at bedtime.  Dispense: 30 tablet; Refill: 0 -     predniSONE; Take 4 tablets (40 mg total) by mouth daily for 2 days, THEN 3 tablets (30 mg total) daily for 2 days, THEN 2 tablets (20 mg total) daily for 2 days, THEN 1 tablet (10 mg total) daily for 2 days.  Dispense: 20 tablet; Refill: 0 -     methylPREDNISolone Acetate  Syncope and collapse Assessment & Plan: As patient was walking to check out of our office,she suffered an unwitnessed syncope and collapse.  Staff ran to get provider, staff and myself immediately surrounded patient.  When I came to patient she was lying supine on the floor, and her eyes were open.Palpable radial pulse.  She was able to respond to questions as asked if she was okay, she would nod or speak quietly.     1 or 2 minutes later, patient was able to talk and asking questions with me such as ' What happened? ' 'Why am I laying on the floor?' 'My head hurts.' We didn't move patient and kept her immobile while we obtained vitals and staff called 911.  Patient states that she was feeling somewhat lightheaded as  she went to the checkout window.  She stated vision did become blurry.  She was able to squeeze she suspects squeeze hands bilaterally, wiggle bilateral fingers and toes.  Vitals were taken while patient was lying on the floor 142/82, oxygen 98%, heart rate 80. I called husband to make him aware of what happened; he described that patient has had syncopal episodes in the past.  Husband is currently in [place] and will drive to [facility] emergency room to meet his wife . Patient did also tell me that she did not drink much water this morning.  Certainly question if vasovagal syncope.  I have given triage report to [facility] ED RN.  Discussed rule out trauma, labs. I did make triage nurse aware that MRI brain, MRA head had previously been ordered and scheduled for next week if appropriate to have these also done in the emergency setting.       Return precautions given.   Risks, benefits, and alternatives of the medications and treatment plan prescribed today were discussed, and patient expressed understanding.   Education regarding symptom management and diagnosis given to patient on AVS either electronically or printed.  Return in about 1 week (around 09/01/2023).  [name], FNP  Subjective:    Patient ID: Mary Richards, female    DOB: 1986-10-10, 37 y.o.   MRN:  [id number]  CC: Faraday Balster is a 37 y.o. female who presents today for an acute visit.    HPI: Patient returns for reevaluation after hives are progressively worsening. Hives had started 2 days ago on bilateral hands, buttocks.  She has stopped Macrobid.  Hives now left flank, behind ears, along hairline and feet. Intensely pruritic. She notices her lips being swollen this morning. She didn't sleep 'at all' last night due to the hives.   She is taking benadryl 50 mg every 4 hours with temporary relief of itching. She remains compliant with zyrtec 10mg  every day.   She is able to breathe and to swallow.  Tongue is not swollen.   Sporadic cough which is 'normal' and unchanged  Denies F, N, v, wheezing, sob.   No tick bite.       No NSAID use.   She was prescribed prednisone taper 08/17/2023 for acute migraine however she didn't take.   Provided dexamethasone 10mg  IM in ED 08/18/23  Last seen by Dr Sidney Ace 08/12/23 ; He advised to stop symibort; she didn't start singulair , trelegy   Cough has improved with symbicort and albuterol BID so she didn't start trelegy ( cost prohibitive as well) . She has concern with affecting depression and did not start  singulair  No known food allergies; She is eats shellfish without allergies.   Allergies: Azithromycin, Sulfa antibiotics, Ciprofloxacin, Macrobid [nitrofurantoin], Moxifloxacin hcl, Bactrim [sulfamethoxazole-trimethoprim], and Wound dressing adhesive Current Outpatient Medications on File Prior to Visit  Medication Sig Dispense Refill   albuterol (PROVENTIL) (2.5 MG/3ML) 0.083% nebulizer solution Take 3 mLs (2.5 mg total) by nebulization every 6 (six) hours as needed for wheezing or shortness of breath. 150 mL 2   albuterol (VENTOLIN HFA) 108 (90 Base) MCG/ACT inhaler INHALE 2 PUFFS INTO THE LUNGS EVERY 6 HOURS AS NEEDED FOR WHEEZING OR SHORTNESS OF BREATH. 6.7 g 1   azelastine (ASTELIN) 0.1 % nasal spray Place 1 spray into both nostrils 2 (two) times daily. Use in each nostril as directed 30 mL 3   budesonide-formoterol (SYMBICORT) 80-4.5 MCG/ACT inhaler Inhale 2 puffs into the lungs 2 (two) times daily. 10.2 g 3   cetirizine (ZYRTEC) 10 MG tablet Take 10 mg by mouth daily.     cholestyramine (QUESTRAN) 4 g packet Take 1 packet (4 g total) by mouth 2 (two) times daily. 60 each 11   fluticasone (FLONASE) 50 MCG/ACT nasal spray Place 2 sprays into both nostrils daily. 16 g 0   meloxicam (MOBIC) 15 MG tablet Take 1 tablet (15 mg total) by mouth daily as needed for pain. 30 tablet 1   nortriptyline (PAMELOR) 25 MG capsule Take 1  capsule (25 mg total) by mouth at bedtime. 90 capsule 1   metoCLOPramide (REGLAN) 5 MG tablet Take 1 tablet (5 mg total) by mouth every 6 (six) hours as needed for up to 5 days for nausea. 20 tablet 0   No current facility-administered medications on file prior to visit.    Review of Systems  Constitutional:  Negative for chills, diaphoresis and fever.  HENT:  Negative for trouble swallowing.   Respiratory:  Negative for cough.   Cardiovascular:  Negative for chest pain and palpitations.  Gastrointestinal:  Negative for nausea and vomiting.  Skin:  Positive for rash.  Neurological:  Negative for headaches.      Objective:    BP 108/60   Pulse 90   Temp 97.9 F (36.6 C) (Oral)   Ht  5\' 7"  (1.702 m)   Wt 200 lb (90.7 kg)   LMP 08/15/2023 (Approximate)   SpO2 99%   BMI 31.32 kg/m   BP Readings from Last 3 Encounters:  08/25/23 108/60  08/24/23 116/84  08/18/23 130/88   Wt Readings from Last 3 Encounters:  08/25/23 200 lb (90.7 kg)  08/24/23 203 lb (92.1 kg)  08/04/23 205 lb 12.8 oz (93.4 kg)    Physical Exam Vitals reviewed.  Constitutional:      Appearance: She is well-developed.  HENT:     Head: Normocephalic and atraumatic.     Salivary Glands: Right salivary gland is not diffusely enlarged. Left salivary gland is not diffusely enlarged.     Right Ear: Hearing, tympanic membrane, ear canal and external ear normal. No decreased hearing noted. No drainage, swelling or tenderness. No middle ear effusion. No foreign body. Tympanic membrane is not erythematous or bulging.     Left Ear: Hearing, tympanic membrane, ear canal and external ear normal. No decreased hearing noted. No drainage, swelling or tenderness.  No middle ear effusion. No foreign body. Tympanic membrane is not erythematous or bulging.     Nose: Nose normal. No rhinorrhea.     Right Sinus: No maxillary sinus tenderness or frontal sinus tenderness.     Left Sinus: No maxillary sinus tenderness or frontal  sinus tenderness.     Mouth/Throat:     Lips: No lesions.     Mouth: No oral lesions.     Pharynx: Uvula midline. No pharyngeal swelling, oropharyngeal exudate, posterior oropharyngeal erythema or uvula swelling.     Tonsils: No tonsillar abscesses.     Comments: Upper and lower lip appears slightly enlarged from her baseline. Tongue is not swollen.  Eyes:     Conjunctiva/sclera: Conjunctivae normal.  Cardiovascular:     Rate and Rhythm: Regular rhythm.     Pulses: Normal pulses.     Heart sounds: Normal heart sounds.  Pulmonary:     Effort: Pulmonary effort is normal.     Breath sounds: Normal breath sounds. No wheezing, rhonchi or rales.  Lymphadenopathy:     Head:     Right side of head: No submental, submandibular, tonsillar, preauricular, posterior auricular or occipital adenopathy.     Left side of head: No submental, submandibular, tonsillar, preauricular, posterior auricular or occipital adenopathy.     Cervical: No cervical adenopathy.  Skin:    General: Skin is warm and dry.  Neurological:     Mental Status: She is alert.  Psychiatric:        Speech: Speech normal.        Behavior: Behavior normal.        Thought Content: Thought content normal.       Raised, erythematous plaques bilateral dorsal aspect of feet, left flank and along hairline.  No vesicular lesions or pustules.

## 2023-08-25 NOTE — Assessment & Plan Note (Addendum)
No evidence of cardiovascular compromise.  Tongue is not swollen.  Lips appear to be slightly swollen from yesterday when I saw patient for urticaria, migraine.  She is managing her secretions.  As she has been unresponsive to Zyrtec 10 mg, I have advised her to increase to 20 mg every day for the next week.  Provided Depo-Medrol 40 mg IM while in office due to lip swelling and prescribed prednisone taper.  Advised her to stop Benadryl and to start Atarax 25 mg nightly.  Plan to collaborate with allergist, Dr [place] Callas, possibly arrange dermatologist consult for second opinion in setting of recurrent urticaria , unknown cause.

## 2023-08-25 NOTE — Patient Instructions (Addendum)
Stop benadryl  ( first generation antihistamine)  Start atarax (first-generation anti histamine) 25 mg at bedtime. May increase in 10 to 25 mg increments at weekly intervals if needed based on response and tolerability.   Increase Zyrtec 20 mg daily.   Please let me know how you are doing and certainly if the weekend you develop progressive symptoms, any difficulty swallowing or breathing, you would need to immediately call 911 or report to nearest emergency room.

## 2023-08-25 NOTE — ED Provider Notes (Signed)
[facility] Provider Note    Event Date/Time   First MD Initiated Contact with Patient 08/25/23 1102     (approximate)   History   Loss of Consciousness (Patient sent from UC where she was being seen for allergic reaction to macrobid for syncopal episode. Patient reports pain to head that radiates down neck. Patient not on blood thinners )   HPI  Mary Richards is a 37 y.o. female past medical history significant for recurrent urticaria, who presents to the emergency department following a syncopal episode.  Patient went to her primary care physician today for ongoing hives with face and lip swelling.  Given Benadryl and steroids.  Whenever she went to go check out had an episode where she was feeling like she was going to pass out and then had a syncopal episode that was witnessed when signing out.  Endorses head ache and neck pain following the the syncopal episode.  No tongue biting or or urinary incontinence.  No history of seizures.  States that she quickly responded back to normal but she is uncertain of how long she was out for.  Denies any chest pain or shortness of breath.  Denies any concern for pregnancy and states that her husband had a vasectomy.  Denies any abdominal pain.  Endorses a history of headache.  States that she saw her primary care physician yesterday for ongoing ringing in her ears and was scheduled to have an MRI done this coming Friday.  Denies any significant headache prior to the fall.  Denies any change in vision or dizziness.  Denies change in hearing.  Denies any extremity numbness or weakness.  Recently started on Macrobid for urinary tract infection.  History of recurrent urticaria and has had 3 prior episodes and was just evaluated by allergy specialist.  No ongoing symptoms of urinary tract infection.  Did not eat breakfast this morning.     Physical Exam   Triage Vital Signs: ED Triage Vitals  Encounter Vitals Group     BP  08/25/23 1112 119/71     Systolic BP Percentile --      Diastolic BP Percentile --      Pulse Rate 08/25/23 1112 83     Resp 08/25/23 1112 18     Temp 08/25/23 1112 99.8 F (37.7 C)     Temp Source 08/25/23 1112 Oral     SpO2 08/25/23 1105 100 %     Weight 08/25/23 1108 200 lb (90.7 kg)     Height 08/25/23 1108 5\' 7"  (1.702 m)     Head Circumference --      Peak Flow --      Pain Score 08/25/23 1108 7     Pain Loc --      Pain Education --      Exclude from Growth Chart --     Most recent vital signs: Vitals:   08/25/23 1107 08/25/23 1112  BP:  119/71  Pulse:  83  Resp:  18  Temp:  99.8 F (37.7 C)  SpO2: 100% 100%    Physical Exam Constitutional:      Appearance: She is well-developed.  HENT:     Head: Atraumatic.  Eyes:     Extraocular Movements: Extraocular movements intact.     Conjunctiva/sclera: Conjunctivae normal.     Pupils: Pupils are equal, round, and reactive to light.  Neck:     Comments: Cervical spine in place Cardiovascular:  Rate and Rhythm: Regular rhythm.  Pulmonary:     Effort: No respiratory distress.  Abdominal:     General: There is no distension.     Tenderness: There is no abdominal tenderness.  Musculoskeletal:        General: Normal range of motion.     Cervical back: Normal range of motion. Tenderness present.     Right lower leg: No edema.     Left lower leg: No edema.  Skin:    General: Skin is warm.     Capillary Refill: Capillary refill takes less than 2 seconds.     Findings: Rash present.     Comments: Urticarial rash to bilateral feet, forearms.  Neurological:     General: No focal deficit present.     Mental Status: She is alert. Mental status is at baseline.     Comments: GCS 15.  5/5 strength bilateral upper and lower extremities.  Sensation intact.  Psychiatric:        Mood and Affect: Mood normal.     IMPRESSION / MDM / ASSESSMENT AND PLAN / ED COURSE  I reviewed the triage vital signs and the nursing  notes.  Differential diagnosis including allergic reaction, anaphylactic reaction, intracranial hemorrhage, cervical spine fracture, dislocation, electrolyte abnormality, migraine headache  EKG  I, Corena Herter, the attending physician, personally viewed and interpreted this ECG.   Rate: Normal  Rhythm: Normal sinus  Axis: Normal  Intervals: Normal  ST&T Change: None  No tachycardic or bradycardic dysrhythmias while on cardiac telemetry.  RADIOLOGY I independently reviewed imaging, my interpretation of imaging: CT scan of the head -no signs of intracranial hemorrhage.  Read as no acute findings.  CT scan of the cervical spine -no sign of fracture or dislocation.  Read as no acute findings.  LABS (all labs ordered are listed, but only abnormal results are displayed) Labs interpreted as -    Labs Reviewed  CBC - Abnormal; Notable for the following components:      Result Value   WBC 16.5 (*)    RBC 5.21 (*)    Hemoglobin 15.1 (*)    HCT 47.7 (*)    All other components within normal limits  BASIC METABOLIC PANEL - Abnormal; Notable for the following components:   Potassium 5.4 (*)    Glucose, Bld 103 (*)    Calcium 8.7 (*)    All other components within normal limits  CBC WITH DIFFERENTIAL/PLATELET - Abnormal; Notable for the following components:   WBC 16.2 (*)    Hemoglobin 15.1 (*)    Neutro Abs 13.8 (*)    Abs Immature Granulocytes 0.23 (*)    All other components within normal limits  MAGNESIUM  CBG MONITORING, ED     MDM    Patient's clinical picture is most concerning for an allergic reaction likely secondary to Macrobid.  Patient is no longer on that medication.  Denies any symptoms of a urinary tract infection and on chart review patient had a UA done yesterday at her primary care physician office that did not show any signs of a UTI.  Do not feel that repeat urine is necessary at this time and currently has a culture that is pending.  On reevaluation  patient's urticaria has resolved.  No progression of any face or lip swelling does not feel like her throat is closing.  Do not feel that the patient needs epinephrine for an anaphylactic reaction at this time.  Encouraged to reestablish and follow-up  with her allergy specialist for further testing.  On reevaluation patient is having left paraspinal muscle tenderness but no midline tenderness have a low suspicion for ligamentous injury.  Patient CT scan of the head was reading as possible sinusitis however patient states that she does not have any symptoms of acute sinusitis at this time.  Does state that she has a history of sinus problems in the past.  Does have leukocytosis of 16, likely secondary to trauma.  Patient without an obvious source of an infectious process at this time.  No headache or neck pain or stiffness prior to the syncopal episode.  Clinical picture is not consistent with meningitis.  No cough or shortness of breath, doubt pneumonia.  Abdomen is nontender, doubt intra-abdominal pathology.  No symptoms of pyelonephritis or urinary tract infection.  On reevaluation patient states she is feeling much better.  Mildly elevated potassium level.  Discussed symptomatic treatment for musculoskeletal pain and strain.  Discussed close follow-up with her primary care physician early next week to repeat lab work and make sure that they are improving.  Discussed return for any ongoing symptoms or worsening symptoms to the emergency department.  Patient expressed understanding.    PROCEDURES:  Critical Care performed: No  Procedures  Patient's presentation is most consistent with acute presentation with potential threat to life or bodily function.   MEDICATIONS ORDERED IN ED: Medications  diphenhydrAMINE (BENADRYL) injection 25 mg (25 mg Intravenous Given 08/25/23 1215)  famotidine (PEPCID) IVPB 20 mg premix (0 mg Intravenous Stopped 08/25/23 1412)  acetaminophen (TYLENOL) tablet 1,000 mg  (1,000 mg Oral Given 08/25/23 1159)  ketorolac (TORADOL) 15 MG/ML injection 15 mg (15 mg Intravenous Given 08/25/23 1413)    FINAL CLINICAL IMPRESSION(S) / ED DIAGNOSES   Final diagnoses:  Syncope, unspecified syncope type  Injury of head, initial encounter  Allergic reaction, initial encounter  Strain of neck muscle, initial encounter     Rx / DC Orders   ED Discharge Orders          Ordered    cyclobenzaprine (FLEXERIL) 5 MG tablet  3 times daily PRN        08/25/23 1415             Note:  This document was prepared using Dragon voice recognition software and may include unintentional dictation errors.   Corena Herter, MD 08/25/23 1452

## 2023-08-25 NOTE — ED Notes (Signed)
Report from dr office:  patient has hives all night .  They gave medrol 40 mg in office and when pt was being discharged, she passed out.  Fell hard hitting head.  Ems was called.  She has hx of migraines and has an mri scheduled for 10/18.  She also has history of passing out.

## 2023-08-28 ENCOUNTER — Other Ambulatory Visit: Payer: Self-pay

## 2023-08-28 ENCOUNTER — Emergency Department
Admission: EM | Admit: 2023-08-28 | Discharge: 2023-08-28 | Disposition: A | Discharge status: Home / Self Care | Payer: 59 | Attending: Emergency Medicine | Admitting: Emergency Medicine

## 2023-08-28 DIAGNOSIS — Z7951 Long term (current) use of inhaled steroids: Secondary | ICD-10-CM | POA: Diagnosis not present

## 2023-08-28 DIAGNOSIS — J45909 Unspecified asthma, uncomplicated: Secondary | ICD-10-CM | POA: Diagnosis not present

## 2023-08-28 DIAGNOSIS — L509 Urticaria, unspecified: Secondary | ICD-10-CM | POA: Diagnosis not present

## 2023-08-28 MED ORDER — FAMOTIDINE IN NACL 20-0.9 MG/50ML-% IV SOLN
20.0000 mg | Freq: Once | INTRAVENOUS | Status: AC
  Administered 2023-08-28: 20 mg via INTRAVENOUS
  Filled 2023-08-28: qty 50

## 2023-08-28 MED ORDER — METHYLPREDNISOLONE SODIUM SUCC 125 MG IJ SOLR
125.0000 mg | Freq: Once | INTRAMUSCULAR | Status: AC
  Administered 2023-08-28: 125 mg via INTRAVENOUS
  Filled 2023-08-28: qty 2

## 2023-08-28 MED ORDER — DIPHENHYDRAMINE HCL 50 MG/ML IJ SOLN
50.0000 mg | Freq: Once | INTRAMUSCULAR | Status: AC
  Administered 2023-08-28: 50 mg via INTRAVENOUS
  Filled 2023-08-28: qty 1

## 2023-08-28 NOTE — ED Notes (Signed)
Provided pt with discharge instructions and education. All of pt questions answered. Pt in possession of all belongings. Pt AAOX4 and stable at time of discharge.Pt ambulated w/ steady gait towards ED exit.

## 2023-08-28 NOTE — Discharge Instructions (Addendum)
Please continue Benadryl 50 mg every 8 hours as needed for itching.  Please continue your prednisone and Atarax as prescribed.  You may also begin taking over-the-counter Zyrtec or Claritin once daily and over-the-counter Pepcid once daily as these are antihistamines as well.  It is safe to take all of these medications together.  Please call your allergist for close follow-up.

## 2023-08-28 NOTE — ED Triage Notes (Signed)
Pt reports allergic reaction x5 days, Pt was seen previously for same. Pt reports taking prescribed attarax, prednisone and benadryl with no relief. Pt has hives to arms legs and torso. Pt took benadryl PTA. Pt denies sob or throat discomfort.

## 2023-08-28 NOTE — ED Provider Notes (Signed)
[facility] Provider Note    Event Date/Time   First MD Initiated Contact with Patient 08/28/23 [phone]     (approximate)   History   Allergic Reaction   HPI  Mary Richards is a 37 y.o. female with history of migraines, asthma who presents to the emergency department with diffuse urticaria.  States the symptoms have been ongoing intermittently for about the last week.  States initially they thought the symptoms were secondary to being on Macrobid.  Last took Macrobid 3 days ago.  She also was recently started on nortriptyline for headaches and states she stopped taking this 1 day ago.  Denies any other new exposures.  She has been on a steroid taper and is on day 2.  She has been taking Benadryl and Atarax without relief.  States when the hives get bad it causes her to have severe pruritus that makes her very nauseated.  She has had intermittent lip and eye swelling with this but none currently.  Last took Benadryl at 8 PM.  States she had to take a cold shower before coming up to the ED just to resolve her symptoms long enough to drive here.   History provided by patient.    Past Medical History:  Diagnosis Date   Asthma    Chronic migraine    Complication of anesthesia    DDD (degenerative disc disease), lumbosacral    Family history of adverse reaction to anesthesia    Mother and Father - PONV   PONV (postoperative nausea and vomiting)    Ruptured left tubal ectopic pregnancy causing hemoperitoneum 01/23/2021   Wears contact lenses     Past Surgical History:  Procedure Laterality Date   CHOLECYSTECTOMY     COLONOSCOPY WITH PROPOFOL N/A 02/19/2021   Procedure: COLONOSCOPY WITH PROPOFOL;  Surgeon: Wyline Mood, MD;  Location: [facility] END[facility]COPY;  Service: Gastroenterology;  Laterality: N/A;   DIAGNOSTIC LAPAROSCOPY WITH REMOVAL OF ECTOPIC PREGNANCY Left 01/23/2021   Procedure: DIAGNOSTIC LAPAROSCOPY WITH REMOVAL OF ECTOPIC PREGNANCY;  Surgeon: Conard Novak, MD;  Location: [facility] ORS;  Service: Gynecology;  Laterality: Left;   PILONIDAL CYST EXCISION     TONSILECTOMY, ADENOIDECTOMY, BILATERAL MYRINGOTOMY AND TUBES     TYMPANOPLASTY Right 05/10/2019   Procedure: TYMPANOPLASTY;  Surgeon: Linus Salmons, MD;  Location: [facility];  Service: ENT;  Laterality: Right;    MEDICATIONS:  Prior to Admission medications   Medication Sig Start Date End Date Taking? Authorizing Provider  albuterol (PROVENTIL) (2.5 MG/3ML) 0.083% nebulizer solution Take 3 mLs (2.5 mg total) by nebulization every 6 (six) hours as needed for wheezing or shortness of breath. 02/24/23   Allegra Grana, FNP  albuterol (VENTOLIN HFA) 108 (90 Base) MCG/ACT inhaler INHALE 2 PUFFS INTO THE LUNGS EVERY 6 HOURS AS NEEDED FOR WHEEZING OR SHORTNESS OF BREATH. 11/30/22 11/30/23  Allegra Grana, FNP  azelastine (ASTELIN) 0.1 % nasal spray Place 1 spray into both nostrils 2 (two) times daily. Use in each nostril as directed 12/14/21   Allegra Grana, FNP  budesonide-formoterol (SYMBICORT) 80-4.5 MCG/ACT inhaler Inhale 2 puffs into the lungs 2 (two) times daily. 06/30/23   Allegra Grana, FNP  cetirizine (ZYRTEC) 10 MG tablet Take 10 mg by mouth daily.    [provider]  cholestyramine (QUESTRAN) 4 g packet Take 1 packet (4 g total) by mouth 2 (two) times daily. 06/28/22   Midge Minium, MD  cyclobenzaprine (FLEXERIL) 5 MG tablet Take 1 tablet (  5 mg total) by mouth 3 (three) times daily as needed for muscle spasms. 08/25/23   Corena Herter, MD  fluticasone (FLONASE) 50 MCG/ACT nasal spray Place 2 sprays into both nostrils daily. 01/31/23   Allegra Grana, FNP  hydrOXYzine (ATARAX) 25 MG tablet Take 1 tablet (25 mg total) by mouth at bedtime. 08/25/23   Allegra Grana, FNP  meloxicam (MOBIC) 15 MG tablet Take 1 tablet (15 mg total) by mouth daily as needed for pain. 01/27/23   Allegra Grana, FNP  metoCLOPramide (REGLAN) 5 MG tablet Take 1 tablet (5  mg total) by mouth every 6 (six) hours as needed for up to 5 days for nausea. 08/17/23 08/22/23  Viviano Simas, FNP  nortriptyline (PAMELOR) 25 MG capsule Take 1 capsule (25 mg total) by mouth at bedtime. 08/24/23   Allegra Grana, FNP  predniSONE (DELTASONE) 10 MG tablet Take 4 tablets (40 mg total) by mouth daily for 2 days, THEN 3 tablets (30 mg total) daily for 2 days, THEN 2 tablets (20 mg total) daily for 2 days, THEN 1 tablet (10 mg total) daily for 2 days. 08/25/23 09/02/23  Allegra Grana, FNP  triamcinolone cream (KENALOG) 0.1 % Apply 1 Application topically 2 (two) times daily to areas of hives 08/25/23   Allegra Grana, FNP    Physical Exam   Triage Vital Signs: ED Triage Vitals  Encounter Vitals Group     BP 08/28/23 0201 130/86     Systolic BP Percentile --      Diastolic BP Percentile --      Pulse Rate 08/28/23 0201 82     Resp 08/28/23 0201 18     Temp 08/28/23 0201 98.3 F (36.8 C)     Temp Source 08/28/23 0201 Oral     SpO2 08/28/23 0201 100 %     Weight 08/28/23 0200 200 lb (90.7 kg)     Height 08/28/23 0200 5\' 7"  (1.702 m)     Head Circumference --      Peak Flow --      Pain Score 08/28/23 0200 0     Pain Loc --      Pain Education --      Exclude from Growth Chart --     Most recent vital signs: Vitals:   08/28/23 0201  BP: 130/86  Pulse: 82  Resp: 18  Temp: 98.3 F (36.8 C)  SpO2: 100%    CONSTITUTIONAL: Alert, responds appropriately to questions. Well-appearing; well-nourished HEAD: Normocephalic, atraumatic EYES: Conjunctivae clear, pupils appear equal, sclera nonicteric ENT: normal nose; moist mucous membranes, no angioedema, no trismus or drooling, normal phonation, no stridor NECK: Supple, normal ROM CARD: RRR; S1 and S2 appreciated RESP: Normal chest excursion without splinting or tachypnea; breath sounds clear and equal bilaterally; no wheezes, no rhonchi, no rales, no hypoxia or respiratory distress, speaking full  sentences ABD/GI: Non-distended; soft, non-tender, no rebound, no guarding, no peritoneal signs BACK: The back appears normal EXT: Normal ROM in all joints; no deformity noted, no edema SKIN: Normal color for age and race; warm; diffuse scattered urticaria NEURO: Moves all extremities equally, normal speech PSYCH: The patient's mood and manner are appropriate.   ED Results / Procedures / Treatments   LABS: (all labs ordered are listed, but only abnormal results are displayed) Labs Reviewed - No data to display   EKG:  EKG Interpretation Date/Time:    Ventricular Rate:    PR Interval:    QRS Duration:  QT Interval:    QTC Calculation:   R Axis:      Text Interpretation:           RADIOLOGY: My personal review and interpretation of imaging:    I have personally reviewed all radiology reports.   No results found.   PROCEDURES:  Critical Care performed: No   CRITICAL CARE Performed by: Rochele Raring   Total critical care time: 0 minutes  Critical care time was exclusive of separately billable procedures and treating other patients.  Critical care was necessary to treat or prevent imminent or life-threatening deterioration.  Critical care was time spent personally by me on the following activities: development of treatment plan with patient and/or surrogate as well as nursing, discussions with consultants, evaluation of patient's response to treatment, examination of patient, obtaining history from patient or surrogate, ordering and performing treatments and interventions, ordering and review of laboratory studies, ordering and review of radiographic studies, pulse oximetry and re-evaluation of patient's condition.   Procedures    IMPRESSION / MDM / ASSESSMENT AND PLAN / ED COURSE  I reviewed the triage vital signs and the nursing notes.    Patient here with diffuse urticaria.  The patient is on the cardiac monitor to evaluate for evidence of  arrhythmia and/or significant heart rate changes.   DIFFERENTIAL DIAGNOSIS (includes but not limited to):   Type I hypersensitivity, type IV hypersensitivity, urticaria multiforme, no signs of SJS, TEN, SSSS, RMSF, Lymes, cellulitis   Patient's presentation is most consistent with acute complicated illness / injury requiring diagnostic workup.   PLAN: Will give IV Benadryl, Solu-Medrol, Pepcid for symptomatic relief.  No indication for epinephrine at this time.  Discussed with patient that she may be getting chronically exposed to something that she is allergic to.  She states that she has stopped taking Macrobid 3 days ago and also just stopped taking nortriptyline 1 day ago.  She has an appointment to see an allergy specialist but not until December.  No sign of any superimposed infection today.  No sign of airway involvement.  Blood pressure is normal.   MEDICATIONS GIVEN IN ED: Medications  methylPREDNISolone sodium succinate (SOLU-MEDROL) 125 mg/2 mL injection 125 mg (125 mg Intravenous Given 08/28/23 0406)  diphenhydrAMINE (BENADRYL) injection 50 mg (50 mg Intravenous Given 08/28/23 0406)  famotidine (PEPCID) IVPB 20 mg premix (0 mg Intravenous Stopped 08/28/23 0438)     ED COURSE:  5:20 AM  Pt reports feeling much better.  Have advised her to continue her Benadryl and Atarax and steroids as prescribed.  Will have her take Zyrtec or Claritin in the morning and Pepcid as well.  She will call her allergy specialist for close follow-up.  At this time, I do not feel there is any life-threatening condition present. I reviewed all nursing notes, vitals, pertinent previous records.  All lab and urine results, EKGs, imaging ordered have been independently reviewed and interpreted by myself.  I reviewed all available radiology reports from any imaging ordered this visit.  Based on my assessment, I feel the patient is safe to be discharged home without further emergent workup and can continue  workup as an outpatient as needed. Discussed all findings, treatment plan as well as usual and customary return precautions.  They verbalize understanding and are comfortable with this plan.  Outpatient follow-up has been provided as needed.  All questions have been answered.    CONSULTS:  none   OUTSIDE RECORDS REVIEWED: Reviewed PCP note on 08/24/2023 and  08/25/2023.Marland Kitchen       FINAL CLINICAL IMPRESSION(S) / ED DIAGNOSES   Final diagnoses:  Urticaria     Rx / DC Orders   ED Discharge Orders     None        Note:  This document was prepared using Dragon voice recognition software and may include unintentional dictation errors.   Shareen Capwell, Layla Maw, DO 08/28/23 [phone]

## 2023-08-29 ENCOUNTER — Other Ambulatory Visit: Payer: Self-pay

## 2023-08-29 ENCOUNTER — Telehealth: Payer: Self-pay | Admitting: Family

## 2023-08-29 DIAGNOSIS — J4530 Mild persistent asthma, uncomplicated: Secondary | ICD-10-CM | POA: Diagnosis not present

## 2023-08-29 DIAGNOSIS — H1045 Other chronic allergic conjunctivitis: Secondary | ICD-10-CM | POA: Diagnosis not present

## 2023-08-29 DIAGNOSIS — J300 Vasomotor rhinitis: Secondary | ICD-10-CM | POA: Diagnosis not present

## 2023-08-29 DIAGNOSIS — J3089 Other allergic rhinitis: Secondary | ICD-10-CM | POA: Diagnosis not present

## 2023-08-29 MED ORDER — MOMETASONE FUROATE 0.1 % EX OINT
1.0000 | TOPICAL_OINTMENT | Freq: Two times a day (BID) | CUTANEOUS | 3 refills | Status: DC | PRN
  Filled 2023-08-29: qty 60, 90d supply, fill #0

## 2023-08-29 MED ORDER — HYDROXYZINE HCL 25 MG PO TABS
25.0000 mg | ORAL_TABLET | Freq: Three times a day (TID) | ORAL | 3 refills | Status: DC | PRN
  Filled 2023-08-29: qty 90, 30d supply, fill #0

## 2023-08-29 MED ORDER — FAMOTIDINE 40 MG PO TABS
40.0000 mg | ORAL_TABLET | Freq: Two times a day (BID) | ORAL | 5 refills | Status: AC
  Filled 2023-08-29: qty 60, 30d supply, fill #0

## 2023-08-29 NOTE — Telephone Encounter (Signed)
Called Dr [place] Callas office [phone] and spoke to [place] and she stated that we need to call [facility]  or [facility] because they do not test for medication allergies .

## 2023-08-29 NOTE — Telephone Encounter (Signed)
Call [facility] Dr [place] Callas [phone]     I am concerned due to persistent hives ; concern for multiple antibiotic allergies including macrobid ( most recently)  Pt has been in the ED 08/28/2023  She needs an appt asap   Please ask when she can be seen and let me know

## 2023-08-29 NOTE — Telephone Encounter (Signed)
Noted  

## 2023-08-30 ENCOUNTER — Ambulatory Visit: Payer: 59 | Admitting: Family

## 2023-08-30 ENCOUNTER — Encounter: Payer: Self-pay | Admitting: Family

## 2023-08-30 ENCOUNTER — Other Ambulatory Visit: Payer: Self-pay

## 2023-08-30 VITALS — BP 116/78 | HR 78 | Temp 97.7°F | Ht 67.0 in | Wt 204.4 lb

## 2023-08-30 DIAGNOSIS — R55 Syncope and collapse: Secondary | ICD-10-CM

## 2023-08-30 DIAGNOSIS — G43809 Other migraine, not intractable, without status migrainosus: Secondary | ICD-10-CM | POA: Diagnosis not present

## 2023-08-30 DIAGNOSIS — L509 Urticaria, unspecified: Secondary | ICD-10-CM | POA: Diagnosis not present

## 2023-08-30 DIAGNOSIS — J0100 Acute maxillary sinusitis, unspecified: Secondary | ICD-10-CM

## 2023-08-30 LAB — CBC WITH DIFFERENTIAL/PLATELET
Basophils Absolute: 0 10*3/uL (ref 0.0–0.1)
Basophils Relative: 0.2 % (ref 0.0–3.0)
Eosinophils Absolute: 0 10*3/uL (ref 0.0–0.7)
Eosinophils Relative: 0.1 % (ref 0.0–5.0)
HCT: 42.4 % (ref 36.0–46.0)
Hemoglobin: 13.6 g/dL (ref 12.0–15.0)
Lymphocytes Relative: 17.7 % (ref 12.0–46.0)
Lymphs Abs: 3 10*3/uL (ref 0.7–4.0)
MCHC: 32.2 g/dL (ref 30.0–36.0)
MCV: 93.5 fL (ref 78.0–100.0)
Monocytes Absolute: 0.9 10*3/uL (ref 0.1–1.0)
Monocytes Relative: 5.5 % (ref 3.0–12.0)
Neutro Abs: 13 10*3/uL — ABNORMAL HIGH (ref 1.4–7.7)
Neutrophils Relative %: 76.5 % (ref 43.0–77.0)
Platelets: 458 10*3/uL — ABNORMAL HIGH (ref 150.0–400.0)
RBC: 4.54 Mil/uL (ref 3.87–5.11)
RDW: 13.9 % (ref 11.5–15.5)
WBC: 17 10*3/uL — ABNORMAL HIGH (ref 4.0–10.5)

## 2023-08-30 LAB — BASIC METABOLIC PANEL
BUN: 13 mg/dL (ref 6–23)
CO2: 28 meq/L (ref 19–32)
Calcium: 9.7 mg/dL (ref 8.4–10.5)
Chloride: 103 meq/L (ref 96–112)
Creatinine, Ser: 0.6 mg/dL (ref 0.40–1.20)
GFR: 115.69 mL/min (ref >60.00)
Glucose, Bld: 100 mg/dL — ABNORMAL HIGH (ref 70–99)
Potassium: 4.3 meq/L (ref 3.5–5.1)
Sodium: 139 meq/L (ref 135–145)

## 2023-08-30 NOTE — Assessment & Plan Note (Signed)
She was not orthostatic on exam today.  Discussed history of syncopal episodes.  Occurring with vomiting.

## 2023-08-30 NOTE — Patient Instructions (Signed)
Lets continue to discuss stay vigilant in regards to syncopal episodes.  Please liberalize salt and add more salt to your diet; it is very important for you to stay hydrated.   Please be very careful with position changes  Referral to [facility], Dr. Lucie Leather Let us know if you dont hear back within a week in regards to an appointment being scheduled.   So that you are aware, if you are [facility] MyChart user , please pay attention to your MyChart messages as you may receive a MyChart message with a phone number to call and schedule this test/appointment own your own from our referral coordinator. This is a new process so I do not want you to miss this message.  If you are not a MyChart user, you will receive a phone call.     Please let me know how you are doing overall

## 2023-08-30 NOTE — Progress Notes (Unsigned)
Assessment & Plan:  There are no diagnoses linked to this encounter.   Return precautions given.   Risks, benefits, and alternatives of the medications and treatment plan prescribed today were discussed, and patient expressed understanding.   Education regarding symptom management and diagnosis given to patient on AVS either electronically or printed.  No follow-ups on file.  [name], FNP  Subjective:    Patient ID: Mary Richards, female    DOB: 04-13-1986, 37 y.o.   MRN: [id number]  CC: Mary Richards is a 37 y.o. female who presents today for follow up, ED follow up.   HPI: She reports that she will get 'hives with any kind of infection'    Hives are worse at night. Hives worse at night and in 'warm places' inside groin, axilla.   Compliant with azelastine, zyrtec.   She is on day 5 of 8 day prednisone course. ( She suspended for one day after solu medrol 125mg )   She was seen by Dr [place] Callas yesterday.  Per patient, he told her to increase atarax 25 mg TID and zyrtec 10mg  TID.   She is no longer on nortriptyline.  Denies F, chills, cough  She has not had syncopal episode or lightheadedness Presents emergency room 08/25/2023 for syncopal episode, injury of head and neck.  Clinical picture most concerning for allergic reaction likely secondary to Macrobid.  New progression of face or lip swelling, not provided with epinephrine.  Given Benadryl 25 mg IM, Pepcid IVPB, acetaminophen 1000 mg, Toradol 15 mg in ED.   CT cervical spine without fracture or dislocation.  CT head without intracranial hemorrhage.  Of note fluid levels in both maxillary sinuses.  WBC 16.5   She returned to the emergency department 08/28/2023 urticaria; given IV Benadryl 50mg , Solu-Medrol 125 IM, Pepcid IVPB 20mg    MRI brain 09/01/23 scheduled  Prior syncope 2019 after she 'popped shoulder into joint.'  She reports previously syncopal episode with vomiting, hives episode 6  months ago.   She usually can sit down when feels lightheaded and prevent syncope. Last week , she felt diaphoretic and vision become 'tunnel' , prior to syncopal episode.   No associated CP, palpitations, SOB.   Cefpodoxime 200mg  BID x 10 days 08/04/23.  Dced augmentin.   Allergies: Azithromycin, Sulfa antibiotics, Ciprofloxacin, Macrobid [nitrofurantoin], Moxifloxacin hcl, Bactrim [sulfamethoxazole-trimethoprim], and Wound dressing adhesive Current Outpatient Medications on File Prior to Visit  Medication Sig Dispense Refill   albuterol (PROVENTIL) (2.5 MG/3ML) 0.083% nebulizer solution Take 3 mLs (2.5 mg total) by nebulization every 6 (six) hours as needed for wheezing or shortness of breath. 150 mL 2   albuterol (VENTOLIN HFA) 108 (90 Base) MCG/ACT inhaler INHALE 2 PUFFS INTO THE LUNGS EVERY 6 HOURS AS NEEDED FOR WHEEZING OR SHORTNESS OF BREATH. 6.7 g 1   azelastine (ASTELIN) 0.1 % nasal spray Place 1 spray into both nostrils 2 (two) times daily. Use in each nostril as directed 30 mL 3   budesonide-formoterol (SYMBICORT) 80-4.5 MCG/ACT inhaler Inhale 2 puffs into the lungs 2 (two) times daily. 10.2 g 3   cetirizine (ZYRTEC) 10 MG tablet Take 10 mg by mouth daily.     cholestyramine (QUESTRAN) 4 g packet Take 1 packet (4 g total) by mouth 2 (two) times daily. 60 each 11   cyclobenzaprine (FLEXERIL) 5 MG tablet Take 1 tablet (5 mg total) by mouth 3 (three) times daily as needed for muscle spasms. 30 tablet 0   famotidine (PEPCID) 40 MG tablet  Take 1 tablet (40 mg total) by mouth 2 (two) times daily. 60 tablet 5   fluticasone (FLONASE) 50 MCG/ACT nasal spray Place 2 sprays into both nostrils daily. 16 g 0   hydrOXYzine (ATARAX) 25 MG tablet Take 1 tablet (25 mg total) by mouth at bedtime. 30 tablet 0   hydrOXYzine (ATARAX) 25 MG tablet Take 1 tablet (25 mg total) by mouth 3 (three) times daily as needed. 90 tablet 3   meloxicam (MOBIC) 15 MG tablet Take 1 tablet (15 mg total) by mouth daily as  needed for pain. 30 tablet 1   mometasone (ELOCON) 0.1 % ointment Apply 1 Application topically sparingly 2 (two) times daily as needed. 60 g 3   nortriptyline (PAMELOR) 25 MG capsule Take 1 capsule (25 mg total) by mouth at bedtime. 90 capsule 1   predniSONE (DELTASONE) 10 MG tablet Take 4 tablets (40 mg total) by mouth daily for 2 days, THEN 3 tablets (30 mg total) daily for 2 days, THEN 2 tablets (20 mg total) daily for 2 days, THEN 1 tablet (10 mg total) daily for 2 days. 20 tablet 0   triamcinolone cream (KENALOG) 0.1 % Apply 1 Application topically 2 (two) times daily to areas of hives 30 g 1   metoCLOPramide (REGLAN) 5 MG tablet Take 1 tablet (5 mg total) by mouth every 6 (six) hours as needed for up to 5 days for nausea. 20 tablet 0   No current facility-administered medications on file prior to visit.    Review of Systems    Objective:    BP 116/78   Pulse 78   Temp 97.7 F (36.5 C) (Oral)   Ht 5\' 7"  (1.702 m)   Wt 204 lb 6.4 oz (92.7 kg)   LMP 08/15/2023 (Approximate)   BMI 32.01 kg/m  BP Readings from Last 3 Encounters:  08/30/23 116/78  08/28/23 124/80  08/25/23 119/71   Wt Readings from Last 3 Encounters:  08/30/23 204 lb 6.4 oz (92.7 kg)  08/28/23 200 lb (90.7 kg)  08/25/23 200 lb (90.7 kg)    Physical Exam

## 2023-08-31 NOTE — Telephone Encounter (Signed)
Noted  Can you get Dr Lyla Son ov notes sent to [place]; she was seen by him this week

## 2023-08-31 NOTE — Assessment & Plan Note (Addendum)
Of unknown etiology.  We discussed viruses that seem to be aggravating and also discussed the context of Macrobid and if new onset allergic reaction is the culprit.  Patient currently following with [facility] Dr. [place] Callas whom she saw yesterday ( requesting notes)  Previously she had been seen by [facility], Dr. Steffanie Rainwater.  Patient and I jointly agreed that second opinion is appropriate as I am concerned in regards to frequent and difficult to treat nature of the urticaria.  Consider dermatology consult as well.  Patient will complete prednisone and continue Zyrtec, hydroxyzine as recommended by Dr. [place] Callas

## 2023-08-31 NOTE — Telephone Encounter (Signed)
Spoke to [place] @ Dr [place] Callas office she stated that she will fax over ov notes. Fax number was given

## 2023-08-31 NOTE — Assessment & Plan Note (Addendum)
Reviewed emergency room visits with patient.  We discussed maxillary sinus fluid levels as noted on CT head.  Reassuring HEENT exam today.  She was treated 3 weeks ago with Cefpodoxime 200mg  BID x 10 days (08/04/23), prior to augmentin ( 07/21/23) .  She has extensive drug allergies and recurrent sinusitis.  We jointly agreed symptoms are not worsening and we will continue to monitor. Consider ENT consult after she has seen Dr Lucie Leather.  She will let me know if any new or acute symptoms which would warrant antibiotics.

## 2023-09-01 ENCOUNTER — Ambulatory Visit
Admission: RE | Admit: 2023-09-01 | Discharge: 2023-09-01 | Disposition: A | Discharge status: Home / Self Care | Payer: 59 | Source: Ambulatory Visit | Attending: Family

## 2023-09-01 DIAGNOSIS — H81399 Other peripheral vertigo, unspecified ear: Secondary | ICD-10-CM

## 2023-09-01 DIAGNOSIS — G43011 Migraine without aura, intractable, with status migrainosus: Secondary | ICD-10-CM

## 2023-09-01 MED ORDER — GADOBUTROL 1 MMOL/ML IV SOLN
9.0000 mL | Freq: Once | INTRAVENOUS | Status: AC | PRN
  Administered 2023-09-01: 9 mL via INTRAVENOUS

## 2023-09-01 NOTE — Addendum Note (Signed)
Addended by: [place], Aliyyah Riese on: 09/01/2023 12:50 PM   Modules accepted: Orders

## 2023-09-01 NOTE — Addendum Note (Signed)
Addended by: [place], Andie Mungin on: 09/01/2023 12:45 PM   Modules accepted: Orders

## 2023-09-04 ENCOUNTER — Ambulatory Visit: Payer: 59 | Admitting: Family

## 2023-09-05 ENCOUNTER — Other Ambulatory Visit: Payer: Self-pay | Admitting: Family

## 2023-09-05 DIAGNOSIS — B9789 Other viral agents as the cause of diseases classified elsewhere: Secondary | ICD-10-CM

## 2023-09-06 ENCOUNTER — Other Ambulatory Visit: Payer: Self-pay

## 2023-09-06 MED ORDER — FLUTICASONE PROPIONATE 50 MCG/ACT NA SUSP
2.0000 | Freq: Every day | NASAL | 0 refills | Status: DC
  Filled 2023-09-06: qty 16, 30d supply, fill #0

## 2023-09-07 ENCOUNTER — Ambulatory Visit: Payer: 59 | Admitting: Family

## 2023-09-13 ENCOUNTER — Encounter: Payer: Self-pay | Admitting: Family

## 2023-09-13 NOTE — Telephone Encounter (Signed)
Spoke to [place] at [facility] MRI she stated that she would send message to have someone read and post results in chart, Pt has been notified

## 2023-09-22 ENCOUNTER — Telehealth: Payer: Self-pay

## 2023-09-22 NOTE — Telephone Encounter (Signed)
Patient returned office phone call and note was read from [place]. Patient has a ENT appointment next week and she sees Dr Lucie Leather next month.

## 2023-09-22 NOTE — Telephone Encounter (Signed)
-----   Message from [name] sent at 09/22/2023 12:51 PM EST ----- Call Mary Richards Mary Richards has not viewed MyChart result note.  Please review my chart note in detail with Mary Richards.   Please let me know if questions

## 2023-09-22 NOTE — Telephone Encounter (Signed)
Lvm for pt to give office a call back in regards to Margaret's mychart msg:     No acute findings on MRI brain, MRA head.  Fortunately mucosal thickening which previously worse on the left is improved.  Unless clinical symptoms are worsening, I would recommend monitoring clinically.  Would you consider seeing [facility] discuss recurrent sinusitis?  Would you prefer to see allergist , Dr Lucie Leather next month as planned to discuss findings?

## 2023-09-26 ENCOUNTER — Telehealth: Payer: Self-pay

## 2023-09-26 NOTE — Telephone Encounter (Signed)
LVM to inform pt of results below per Claris Che  No acute findings on MRI brain, MRA head.  Fortunately mucosal thickening which previously worse on the left is improved.  Unless clinical symptoms are worsening, I would recommend monitoring clinically.  Would you consider seeing [facility] discuss recurrent sinusitis?  Would you prefer to see allergist , Dr Lucie Leather next month as planned to discuss findings?

## 2023-09-26 NOTE — Telephone Encounter (Signed)
Patient just called. I read her the message.

## 2023-10-09 ENCOUNTER — Telehealth: Payer: 59 | Admitting: Family Medicine

## 2023-10-09 DIAGNOSIS — R21 Rash and other nonspecific skin eruption: Secondary | ICD-10-CM

## 2023-10-09 NOTE — Progress Notes (Signed)
    Thank you for the details you included in the comment boxes. Those details are very helpful in determining the best course of treatment for you and help [place] to provide the best care.Because you were just treated for this similarly in Oct, and are having a return of symptoms, we think in person might be best, but we will try to see if we can assist you with a virtual video first. We recommend that you schedule a Virtual Urgent Care video visit in order for the provider to better assess what is going on.  The provider will be able to give you a more accurate diagnosis and treatment plan if we can more freely discuss your symptoms and with the addition of a virtual examination.    If you change your visit to a video visit, we will bill your insurance (similar to an office visit) and you will not be charged for this e-Visit. You will be able to stay at home and speak with the first available [facility] advanced practice provider. The link to do a video visit is in the drop down Menu tab of your Welcome screen in MyChart.

## 2023-10-11 ENCOUNTER — Encounter: Payer: Self-pay | Admitting: Family

## 2023-10-11 ENCOUNTER — Other Ambulatory Visit: Payer: Self-pay | Admitting: Family

## 2023-10-11 ENCOUNTER — Other Ambulatory Visit: Payer: Self-pay

## 2023-10-11 DIAGNOSIS — L509 Urticaria, unspecified: Secondary | ICD-10-CM

## 2023-10-11 MED ORDER — PREDNISONE 10 MG PO TABS
ORAL_TABLET | ORAL | 0 refills | Status: AC
Start: 2023-10-11 — End: 2023-10-17
  Filled 2023-10-11: qty 17, 6d supply, fill #0

## 2023-10-11 MED ORDER — HYDROXYZINE HCL 25 MG PO TABS
25.0000 mg | ORAL_TABLET | Freq: Three times a day (TID) | ORAL | 3 refills | Status: AC | PRN
  Filled 2023-10-11: qty 90, 30d supply, fill #0

## 2023-10-11 NOTE — Telephone Encounter (Signed)
Pt is agreeable to prednisolone

## 2023-10-25 ENCOUNTER — Ambulatory Visit: Payer: 59 | Admitting: Allergy and Immunology

## 2023-10-28 ENCOUNTER — Telehealth: Payer: 59 | Admitting: Physician Assistant

## 2023-10-28 DIAGNOSIS — J069 Acute upper respiratory infection, unspecified: Secondary | ICD-10-CM

## 2023-10-28 NOTE — Progress Notes (Signed)
 Because of the duration of your symptoms, I feel your condition warrants further evaluation and I recommend that you be seen in a face to face visit.   NOTE: There will be NO CHARGE for this eVisit   If you are having a true medical emergency please call 911.      For an urgent face to face visit, [facility] has eight urgent care centers for your convenience:   NEW!! [facility]gent [facility] Center at [facility] Get Driving Directions [phone] [address], [address] [place], [address]    [facility] Urgent Care Center at [facility] Get Driving Directions [phone] [address] [place], [place] [address]   [facility] Urgent Care Center [facility] [place]) Get Driving Directions [phone] [address] [place], [place] [address]  [facility] Urgent Care Center [facility] [place]) Get Driving Directions [phone] [address] [place],  [place]  [address]  [facility] Health Urgent Care Center [facility] - at [employer]  [phone] [address] [place],  [place] [address]   [facility] Health Urgent Care at [facility] Get Driving Directions [phone] [address] [place], [place] [address]   [facility] Health Urgent Care at [facility] Get Driving Directions  [phone] [address] [place], [place] [address]   [facility] Health Urgent Care at [facility] Directions [phone] [address] [place], [place] [address]  Your MyChart E-visit questionnaire answers were reviewed by a board certified advanced clinical practitioner to complete your personal care plan based on your specific symptoms.  Thank you for using e-Visits.

## 2023-10-31 ENCOUNTER — Ambulatory Visit
Admission: EM | Admit: 2023-10-31 | Discharge: 2023-10-31 | Disposition: A | Discharge status: Home / Self Care | Payer: 59 | Attending: Emergency Medicine | Admitting: Emergency Medicine

## 2023-10-31 ENCOUNTER — Ambulatory Visit: Payer: 59

## 2023-10-31 ENCOUNTER — Other Ambulatory Visit: Payer: Self-pay

## 2023-10-31 DIAGNOSIS — J0140 Acute pansinusitis, unspecified: Secondary | ICD-10-CM | POA: Diagnosis not present

## 2023-10-31 MED ORDER — PREDNISONE 10 MG (21) PO TBPK
ORAL_TABLET | Freq: Every day | ORAL | 0 refills | Status: DC
  Filled 2023-10-31: qty 21, 6d supply, fill #0

## 2023-10-31 NOTE — ED Provider Notes (Signed)
Renaldo Fiddler    CSN: [id number] Arrival date & time: 10/31/23  0807      History   Chief Complaint Chief Complaint  Patient presents with   Nasal Congestion    HPI Mary Richards is a 37 y.o. female.   Patient presents for evaluation of nasal congestion, chest congestion, a nonproductive cough, wheezing, bilateral ear fullness, intermittent generalized headache and sore throat present for 13 days.  Over the last 3 days has had increasing sinus pressure along the forehead and the cheeks radiating into the teeth causing constant pain.  Did completed e-visit and is currently taking amoxicillin.  Wheezing has resolved with use of albuterol inhaler, known history of asthma.  Sore throat has resolved and congestion has improved.  Tolerating food and liquids.  Additionally has been using Zyrtec, Flonase, as Astelin, Sudafed, Mucinex and ibuprofen.Sick contact within household   Past Medical History:  Diagnosis Date   Asthma    Chronic migraine    Complication of anesthesia    DDD (degenerative disc disease), lumbosacral    Family history of adverse reaction to anesthesia    Mother and Father - PONV   PONV (postoperative nausea and vomiting)    Ruptured left tubal ectopic pregnancy causing hemoperitoneum 01/23/2021   Wears contact lenses     Patient Active Problem List   Diagnosis Date Noted   Syncope and collapse 08/25/2023   Vertigo 06/30/2023   Sinusitis 06/30/2023   Respiratory illness 04/13/2023   Pneumonia 03/06/2023   Persistent cough 02/16/2023   Urticaria 02/08/2023   Back pain 01/27/2023   Intractable migraine 09/02/2022   Dysuria 07/28/2022   URI with cough and congestion 05/23/2022   History of urinary stone 04/24/2022   Irritable bowel syndrome without diarrhea 04/24/2022   Irritant contact dermatitis, unspecified cause 04/24/2022   Asthma during pregnancy 05/28/2021   Routine physical examination 01/11/2021   Bilateral impacted cerumen  06/05/2020   Obesity (BMI 30-39.9) 04/29/2020   GAD (generalized anxiety disorder) 07/08/2019   Chronic low back pain 07/08/2019    Past Surgical History:  Procedure Laterality Date   CHOLECYSTECTOMY     COLONOSCOPY WITH PROPOFOL N/A 02/19/2021   Procedure: COLONOSCOPY WITH PROPOFOL;  Surgeon: Wyline Mood, MD;  Location: [facility];  Service: Gastroenterology;  Laterality: N/A;   DIAGNOSTIC LAPAROSCOPY WITH REMOVAL OF ECTOPIC PREGNANCY Left 01/23/2021   Procedure: DIAGNOSTIC LAPAROSCOPY WITH REMOVAL OF ECTOPIC PREGNANCY;  Surgeon: Conard Novak, MD;  Location: [facility] ORS;  Service: Gynecology;  Laterality: Left;   PILONIDAL CYST EXCISION     TONSILECTOMY, ADENOIDECTOMY, BILATERAL MYRINGOTOMY AND TUBES     TYMPANOPLASTY Right 05/10/2019   Procedure: TYMPANOPLASTY;  Surgeon: Linus Salmons, MD;  Location: [facility];  Service: ENT;  Laterality: Right;    OB History     Gravida  3   Para  2   Term  2   Preterm      AB  1   Living  2      SAB      IAB      Ectopic  1   Multiple  0   Live Births  2            Home Medications    Prior to Admission medications   Medication Sig Start Date End Date Taking? Authorizing Provider  predniSONE (STERAPRED UNI-PAK 21 TAB) 10 MG (21) TBPK tablet Take by mouth daily. Take 6 tabs by mouth daily  for 1 days, then 5  tabs for 1 days, then 4 tabs for 1 days, then 3 tabs for 1 days, 2 tabs for 1 days, then 1 tab by mouth daily for 1 days 10/31/23  Yes Ader Fritze R, NP  albuterol (PROVENTIL) (2.5 MG/3ML) 0.083% nebulizer solution Take 3 mLs (2.5 mg total) by nebulization every 6 (six) hours as needed for wheezing or shortness of breath. 02/24/23   Allegra Grana, FNP  albuterol (VENTOLIN HFA) 108 (90 Base) MCG/ACT inhaler INHALE 2 PUFFS INTO THE LUNGS EVERY 6 HOURS AS NEEDED FOR WHEEZING OR SHORTNESS OF BREATH. 11/30/22 11/30/23  Allegra Grana, FNP  azelastine (ASTELIN) 0.1 % nasal spray Place 1 spray into  both nostrils 2 (two) times daily. Use in each nostril as directed 12/14/21   Allegra Grana, FNP  budesonide-formoterol (SYMBICORT) 80-4.5 MCG/ACT inhaler Inhale 2 puffs into the lungs 2 (two) times daily. 06/30/23   Allegra Grana, FNP  cetirizine (ZYRTEC) 10 MG tablet Take 10 mg by mouth daily.    [provider]  cholestyramine (QUESTRAN) 4 g packet Take 1 packet (4 g total) by mouth 2 (two) times daily. 06/28/22   Midge Minium, MD  cyclobenzaprine (FLEXERIL) 5 MG tablet Take 1 tablet (5 mg total) by mouth 3 (three) times daily as needed for muscle spasms. 08/25/23   Corena Herter, MD  famotidine (PEPCID) 40 MG tablet Take 1 tablet (40 mg total) by mouth 2 (two) times daily. 08/29/23     fluticasone (FLONASE) 50 MCG/ACT nasal spray Place 2 sprays into both nostrils daily. 09/06/23   Allegra Grana, FNP  hydrOXYzine (ATARAX) 25 MG tablet Take 1 tablet (25 mg total) by mouth at bedtime. 08/25/23   Allegra Grana, FNP  hydrOXYzine (ATARAX) 25 MG tablet Take 1 tablet (25 mg total) by mouth 3 (three) times daily as needed. 10/11/23   Allegra Grana, FNP  meloxicam (MOBIC) 15 MG tablet Take 1 tablet (15 mg total) by mouth daily as needed for pain. 01/27/23   Allegra Grana, FNP  metoCLOPramide (REGLAN) 5 MG tablet Take 1 tablet (5 mg total) by mouth every 6 (six) hours as needed for up to 5 days for nausea. 08/17/23 08/22/23  Viviano Simas, FNP  mometasone (ELOCON) 0.1 % ointment Apply 1 Application topically sparingly 2 (two) times daily as needed. 08/29/23     nortriptyline (PAMELOR) 25 MG capsule Take 1 capsule (25 mg total) by mouth at bedtime. 08/24/23   Allegra Grana, FNP  triamcinolone cream (KENALOG) 0.1 % Apply 1 Application topically 2 (two) times daily to areas of hives 08/25/23   Allegra Grana, FNP    Family History Family History  Problem Relation Age of Onset   Irritable bowel syndrome Mother    Brain cancer Father 10       Glioblastoma, died one  year after dx   Valvular heart disease Sister    COPD Maternal Grandfather    Brain cancer Paternal Grandmother 31       unsure what type, she was not treated   Thyroid cancer Neg Hx    Colon cancer Neg Hx    Breast cancer Neg Hx     Social History Social History   Tobacco Use   Smoking status: Never   Smokeless tobacco: Never  Vaping Use   Vaping status: Never Used  Substance Use Topics   Alcohol use: Yes    Alcohol/week: 2.0 standard drinks of alcohol    Types: 2 Standard drinks or  equivalent per week    Comment: Social    Drug use: Never     Allergies   Azithromycin, Sulfa antibiotics, Ciprofloxacin, Macrobid [nitrofurantoin], Moxifloxacin hcl, Bactrim [sulfamethoxazole-trimethoprim], and Wound dressing adhesive   Review of Systems Review of Systems   Physical Exam Triage Vital Signs ED Triage Vitals  Encounter Vitals Group     BP 10/31/23 0837 114/78     Systolic BP Percentile --      Diastolic BP Percentile --      Pulse Rate 10/31/23 0837 91     Resp 10/31/23 0837 18     Temp 10/31/23 0837 98.8 F (37.1 C)     Temp Source 10/31/23 0837 Oral     SpO2 10/31/23 0837 98 %     Weight --      Height --      Head Circumference --      Peak Flow --      Pain Score 10/31/23 0841 3     Pain Loc --      Pain Education --      Exclude from Growth Chart --    No data found.  Updated Vital Signs BP 114/78 (BP Location: Left Arm)   Pulse 91   Temp 98.8 F (37.1 C) (Oral)   Resp 18   LMP 10/06/2023   SpO2 98%   Visual Acuity Right Eye Distance:   Left Eye Distance:   Bilateral Distance:    Right Eye Near:   Left Eye Near:    Bilateral Near:     Physical Exam Constitutional:      Appearance: Normal appearance.  HENT:     Head: Normocephalic.     Right Ear: Tympanic membrane, ear canal and external ear normal.     Left Ear: Tympanic membrane, ear canal and external ear normal.     Nose: Congestion present. No rhinorrhea.     Right Sinus:  Maxillary sinus tenderness and frontal sinus tenderness present.     Left Sinus: Maxillary sinus tenderness and frontal sinus tenderness present.     Mouth/Throat:     Mouth: Mucous membranes are moist.     Pharynx: Oropharynx is clear. No oropharyngeal exudate or posterior oropharyngeal erythema.  Eyes:     Extraocular Movements: Extraocular movements intact.  Cardiovascular:     Rate and Rhythm: Normal rate and regular rhythm.     Pulses: Normal pulses.     Heart sounds: Normal heart sounds.  Pulmonary:     Effort: Pulmonary effort is normal.     Breath sounds: Normal breath sounds.  Musculoskeletal:     Cervical back: Normal range of motion and neck supple.  Neurological:     Mental Status: She is alert and oriented to person, place, and time. Mental status is at baseline.      UC Treatments / Results  Labs (all labs ordered are listed, but only abnormal results are displayed) Labs Reviewed - No data to display  EKG   Radiology No results found.  Procedures Procedures (including critical care time)  Medications Ordered in UC Medications - No data to display  Initial Impression / Assessment and Plan / UC Course  I have reviewed the triage vital signs and the nursing notes.  Pertinent labs & imaging results that were available during my care of the patient were reviewed by me and considered in my medical decision making (see chart for details).  Acute nonrecurrent pansinusitis  Patient is in no signs of  distress nor toxic appearing.  Vital signs are stable.  Low suspicion for pneumonia, pneumothorax or bronchitis and therefore will defer imaging.  Presentation and symptomology consistent with a sinusitis, encouraged to complete amoxicillin as directed.  Declined steroid injection in clinic, prescribed oral prednisone course.  Recommended continue supportive careMay follow-up with urgent care as needed if symptoms persist or worsen.  .   Final Clinical Impressions(s)  / UC Diagnoses   Final diagnoses:  Acute non-recurrent pansinusitis     Discharge Instructions      You are being treated for a sinus infection  Continue antibiotic as directed to provide coverage for bacteria causing symptoms to prolong  Begin prednisone every morning with food as directed to help reduce sinus pressure you may take Tylenol in addition to this  Continue all supportive care to help further reduce congestion, can additionally attempt any of the following below   For cough: honey 1/2 to 1 teaspoon (you can dilute the honey in water or another fluid).  You can also use guaifenesin and dextromethorphan for cough. You can use a humidifier for chest congestion and cough.  If you don't have a humidifier, you can sit in the bathroom with the hot shower running.      For sore throat: try warm salt water gargles, cepacol lozenges, throat spray, warm tea or water with lemon/honey, popsicles or ice, or OTC cold relief medicine for throat discomfort.   For congestion: take a daily anti-histamine like Zyrtec, Claritin, and a oral decongestant, such as pseudoephedrine.  You can also use Flonase 1-2 sprays in each nostril daily.   It is important to stay hydrated: drink plenty of fluids (water, gatorade/powerade/pedialyte, juices, or teas) to keep your throat moisturized and help further relieve irritation/discomfort.    ED Prescriptions     Medication Sig Dispense Auth. Provider   predniSONE (STERAPRED UNI-PAK 21 TAB) 10 MG (21) TBPK tablet Take by mouth daily. Take 6 tabs by mouth daily  for 1 days, then 5 tabs for 1 days, then 4 tabs for 1 days, then 3 tabs for 1 days, 2 tabs for 1 days, then 1 tab by mouth daily for 1 days 21 tablet Freddie Nghiem, Elita Boone, NP      PDMP not reviewed this encounter.   Valinda Hoar, [place] 10/31/23 [phone]

## 2023-10-31 NOTE — Discharge Instructions (Signed)
You are being treated for a sinus infection  Continue antibiotic as directed to provide coverage for bacteria causing symptoms to prolong  Begin prednisone every morning with food as directed to help reduce sinus pressure you may take Tylenol in addition to this  Continue all supportive care to help further reduce congestion, can additionally attempt any of the following below   For cough: honey 1/2 to 1 teaspoon (you can dilute the honey in water or another fluid).  You can also use guaifenesin and dextromethorphan for cough. You can use a humidifier for chest congestion and cough.  If you don't have a humidifier, you can sit in the bathroom with the hot shower running.      For sore throat: try warm salt water gargles, cepacol lozenges, throat spray, warm tea or water with lemon/honey, popsicles or ice, or OTC cold relief medicine for throat discomfort.   For congestion: take a daily anti-histamine like Zyrtec, Claritin, and a oral decongestant, such as pseudoephedrine.  You can also use Flonase 1-2 sprays in each nostril daily.   It is important to stay hydrated: drink plenty of fluids (water, gatorade/powerade/pedialyte, juices, or teas) to keep your throat moisturized and help further relieve irritation/discomfort.

## 2023-10-31 NOTE — ED Triage Notes (Signed)
Patient to Urgent Care with complaints of nasal congestion/ sinus pain and pressure. Pain radiates into teeth and jaw.   Reports symptoms started 13 days ago.  Meds: taking amoxicillin (x3 days) prescribed via e-visit. Taking ibuprofen d/t discomfort. Zyrtec twice daily/ flonase/ astelin nasal spray/ sudafed/ mucinex.

## 2023-11-03 ENCOUNTER — Other Ambulatory Visit: Payer: Self-pay

## 2023-11-03 ENCOUNTER — Telehealth: Payer: 59 | Admitting: Physician Assistant

## 2023-11-03 DIAGNOSIS — H109 Unspecified conjunctivitis: Secondary | ICD-10-CM

## 2023-11-03 MED ORDER — POLYMYXIN B-TRIMETHOPRIM 10000-0.1 UNIT/ML-% OP SOLN
1.0000 [drp] | Freq: Four times a day (QID) | OPHTHALMIC | 0 refills | Status: DC
Start: 2023-11-03 — End: 2024-09-03
  Filled 2023-11-03: qty 10, 5d supply, fill #0

## 2023-11-03 NOTE — Progress Notes (Signed)
E-Visit for Pink Eye   We are sorry that you are not feeling well.  Here is how we plan to help!  Based on what you have shared with me it looks like you have conjunctivitis.  Conjunctivitis is a common inflammatory or infectious condition of the eye that is often referred to as "pink eye".  In most cases it is contagious (viral or bacterial). However, not all conjunctivitis requires antibiotics (ex. Allergic).  We have made appropriate suggestions for you based upon your presentation.  I have prescribed Polytrim Ophthalmic drops 1-2 drops 4 times a day times 5 days  Pink eye can be highly contagious.  It is typically spread through direct contact with secretions, or contaminated objects or surfaces that one may have touched.  Strict handwashing is suggested with soap and water is urged.  If not available, use alcohol based had sanitizer.  Avoid unnecessary touching of the eye.  If you wear contact lenses, you will need to refrain from wearing them until you see no white discharge from the eye for at least 24 hours after being on medication.  You should see symptom improvement in 1-2 days after starting the medication regimen.  Call us if symptoms are not improved in 1-2 days.  Home Care: Wash your hands often! Do not wear your contacts until you complete your treatment plan. Avoid sharing towels, bed linen, personal items with a person who has pink eye. See attention for anyone in your home with similar symptoms.  Get Help Right Away If: Your symptoms do not improve. You develop blurred or loss of vision. Your symptoms worsen (increased discharge, pain or redness)   Thank you for choosing an e-visit.  Your e-visit answers were reviewed by a board certified advanced clinical practitioner to complete your personal care plan. Depending upon the condition, your plan could have included both over the counter or prescription medications.  Please review your pharmacy choice. Make sure the  pharmacy is open so you can pick up prescription now. If there is a problem, you may contact your provider through MyChart messaging and have the prescription routed to another pharmacy.  Your safety is important to us. If you have drug allergies check your prescription carefully.   For the next 24 hours you can use MyChart to ask questions about today's visit, request a non-urgent call back, or ask for a work or school excuse. You will get an email in the next two days asking about your experience. I hope that your e-visit has been valuable and will speed your recovery.  I have spent 5 minutes in review of e-visit questionnaire, review and updating patient chart, medical decision making and response to patient.   Wilkes Potvin M Hennessey Cantrell, PA-C  

## 2023-11-06 ENCOUNTER — Other Ambulatory Visit: Payer: Self-pay

## 2023-11-06 ENCOUNTER — Telehealth: Payer: 59 | Admitting: Physician Assistant

## 2023-11-06 DIAGNOSIS — B9689 Other specified bacterial agents as the cause of diseases classified elsewhere: Secondary | ICD-10-CM

## 2023-11-06 DIAGNOSIS — J019 Acute sinusitis, unspecified: Secondary | ICD-10-CM

## 2023-11-06 MED ORDER — DOXYCYCLINE HYCLATE 100 MG PO TABS
100.0000 mg | ORAL_TABLET | Freq: Two times a day (BID) | ORAL | 0 refills | Status: DC
Start: 2023-11-06 — End: 2023-12-11
  Filled 2023-11-06: qty 20, 10d supply, fill #0

## 2023-11-06 NOTE — Progress Notes (Signed)
Virtual Visit Consent   Anabiya Beauford, you are scheduled for a virtual visit with a [facility] provider today. Just as with appointments in the office, your consent must be obtained to participate. Your consent will be active for this visit and any virtual visit you may have with one of our providers in the next 365 days. If you have a MyChart account, a copy of this consent can be sent to you electronically.  As this is a virtual visit, video technology does not allow for your provider to perform a traditional examination. This may limit your provider's ability to fully assess your condition. If your provider identifies any concerns that need to be evaluated in person or the need to arrange testing (such as labs, EKG, etc.), we will make arrangements to do so. Although advances in technology are sophisticated, we cannot ensure that it will always work on either your end or our end. If the connection with a video visit is poor, the visit may have to be switched to a telephone visit. With either a video or telephone visit, we are not always able to ensure that we have a secure connection.  By engaging in this virtual visit, you consent to the provision of healthcare and authorize for your insurance to be billed (if applicable) for the services provided during this visit. Depending on your insurance coverage, you may receive a charge related to this service.  I need to obtain your verbal consent now. Are you willing to proceed with your visit today? Ayrin Pizzimenti has provided verbal consent on 11/06/2023 for a virtual visit (video or telephone). Piedad Climes, [place]  Date: 11/06/2023 10:50 AM  Virtual Visit via Video Note   I, Piedad Climes, connected with  Mary Richards  ([id number], [birth year]) on 11/06/23 at 10:45 AM EST by a video-enabled telemedicine application and verified that I am speaking with the correct person using two identifiers.  Location: Patient:  Virtual Visit Location Patient: Home Provider: Virtual Visit Location Provider: Home Office   I discussed the limitations of evaluation and management by telemedicine and the availability of in person appointments. The patient expressed understanding and agreed to proceed.    History of Present Illness: Mary Richards is a 37 y.o. who identifies as a female who was assigned female at birth, and is being seen today for continued symptoms of sinusitis over the past couple of weeks despite treatment with course of steroid and Augmentin. Notes substantial continued facial and sinus pain. Discharge is green-yellow. Morning chest congestion (thinks from PND). Denies fever, chills, aches.   OTC -- Mucinex, Sudafed, Claritin, Flonase  HPI: HPI  Problems:  Patient Active Problem List   Diagnosis Date Noted   Syncope and collapse 08/25/2023   Vertigo 06/30/2023   Sinusitis 06/30/2023   Respiratory illness 04/13/2023   Pneumonia 03/06/2023   Persistent cough 02/16/2023   Urticaria 02/08/2023   Back pain 01/27/2023   Intractable migraine 09/02/2022   Dysuria 07/28/2022   URI with cough and congestion 05/23/2022   History of urinary stone 04/24/2022   Irritable bowel syndrome without diarrhea 04/24/2022   Irritant contact dermatitis, unspecified cause 04/24/2022   Asthma during pregnancy 05/28/2021   Routine physical examination 01/11/2021   Bilateral impacted cerumen 06/05/2020   Obesity (BMI 30-39.9) 04/29/2020   GAD (generalized anxiety disorder) 07/08/2019   Chronic low back pain 07/08/2019    Allergies:  Allergies  Allergen Reactions   Azithromycin Hives, Shortness Of Breath and  Swelling    Throat swelling   Sulfa Antibiotics Hives   Ciprofloxacin Swelling   Macrobid [Nitrofurantoin] Hives    08/2023   Moxifloxacin Hcl Hives   Bactrim [Sulfamethoxazole-Trimethoprim] Rash   Wound Dressing Adhesive Rash    Dermabond   Medications:  Current Outpatient Medications:     doxycycline (VIBRA-TABS) 100 MG tablet, Take 1 tablet (100 mg total) by mouth 2 (two) times daily., Disp: 20 tablet, Rfl: 0   albuterol (PROVENTIL) (2.5 MG/3ML) 0.083% nebulizer solution, Take 3 mLs (2.5 mg total) by nebulization every 6 (six) hours as needed for wheezing or shortness of breath., Disp: 150 mL, Rfl: 2   albuterol (VENTOLIN HFA) 108 (90 Base) MCG/ACT inhaler, INHALE 2 PUFFS INTO THE LUNGS EVERY 6 HOURS AS NEEDED FOR WHEEZING OR SHORTNESS OF BREATH., Disp: 6.7 g, Rfl: 1   azelastine (ASTELIN) 0.1 % nasal spray, Place 1 spray into both nostrils 2 (two) times daily. Use in each nostril as directed, Disp: 30 mL, Rfl: 3   budesonide-formoterol (SYMBICORT) 80-4.5 MCG/ACT inhaler, Inhale 2 puffs into the lungs 2 (two) times daily., Disp: 10.2 g, Rfl: 3   cetirizine (ZYRTEC) 10 MG tablet, Take 10 mg by mouth daily., Disp: , Rfl:    cholestyramine (QUESTRAN) 4 g packet, Take 1 packet (4 g total) by mouth 2 (two) times daily., Disp: 60 each, Rfl: 11   cyclobenzaprine (FLEXERIL) 5 MG tablet, Take 1 tablet (5 mg total) by mouth 3 (three) times daily as needed for muscle spasms., Disp: 30 tablet, Rfl: 0   famotidine (PEPCID) 40 MG tablet, Take 1 tablet (40 mg total) by mouth 2 (two) times daily., Disp: 60 tablet, Rfl: 5   fluticasone (FLONASE) 50 MCG/ACT nasal spray, Place 2 sprays into both nostrils daily., Disp: 16 g, Rfl: 0   hydrOXYzine (ATARAX) 25 MG tablet, Take 1 tablet (25 mg total) by mouth at bedtime., Disp: 30 tablet, Rfl: 0   hydrOXYzine (ATARAX) 25 MG tablet, Take 1 tablet (25 mg total) by mouth 3 (three) times daily as needed., Disp: 90 tablet, Rfl: 3   meloxicam (MOBIC) 15 MG tablet, Take 1 tablet (15 mg total) by mouth daily as needed for pain., Disp: 30 tablet, Rfl: 1   metoCLOPramide (REGLAN) 5 MG tablet, Take 1 tablet (5 mg total) by mouth every 6 (six) hours as needed for up to 5 days for nausea., Disp: 20 tablet, Rfl: 0   mometasone (ELOCON) 0.1 % ointment, Apply 1 Application  topically sparingly 2 (two) times daily as needed., Disp: 60 g, Rfl: 3   nortriptyline (PAMELOR) 25 MG capsule, Take 1 capsule (25 mg total) by mouth at bedtime., Disp: 90 capsule, Rfl: 1   predniSONE (STERAPRED UNI-PAK 21 TAB) 10 MG (21) TBPK tablet, Take by mouth daily. Take 6 tabs by mouth daily  for 1 days, then 5 tabs for 1 days, then 4 tabs for 1 days, then 3 tabs for 1 days, 2 tabs for 1 days, then 1 tab by mouth daily for 1 days, Disp: 21 tablet, Rfl: 0   triamcinolone cream (KENALOG) 0.1 %, Apply 1 Application topically 2 (two) times daily to areas of hives, Disp: 30 g, Rfl: 1   trimethoprim-polymyxin b (POLYTRIM) ophthalmic solution, Place 1 drop into the left eye every 6 (six) hours for 5 days, Disp: 10 mL, Rfl: 0  Observations/Objective: Patient is well-developed, well-nourished in no acute distress.  Resting comfortably  at home.  Head is normocephalic, atraumatic.  No labored breathing. Speech is  clear and coherent with logical content.  Patient is alert and oriented at baseline.   Assessment and Plan: 1. Acute bacterial sinusitis (Primary) - doxycycline (VIBRA-TABS) 100 MG tablet; Take 1 tablet (100 mg total) by mouth 2 (two) times daily.  Dispense: 20 tablet; Refill: 0  Stop Augmentin. Rx Doxycycline.  Increase fluids.  Rest.  Saline nasal spray.  Probiotic.  Mucinex as directed.  Humidifier in bedroom. Continue Flonase and OTC medications as discussed.  Call or return to clinic if symptoms are not improving.   Follow Up Instructions: I discussed the assessment and treatment plan with the patient. The patient was provided an opportunity to ask questions and all were answered. The patient agreed with the plan and demonstrated an understanding of the instructions.  A copy of instructions were sent to the patient via MyChart unless otherwise noted below.   The patient was advised to call back or seek an in-person evaluation if the symptoms worsen or if the condition fails to  improve as anticipated.    Piedad Climes, PA-C

## 2023-11-06 NOTE — Patient Instructions (Signed)
Mary Richards, thank you for joining Mary Climes, PA-C for today's virtual visit.  While this provider is not your primary care provider (PCP), if your PCP is located in our provider database this encounter information will be shared with them immediately following your visit.   A [facility] MyChart account gives you access to today's visit and all your visits, tests, and labs performed at [facility] " click here if you don't have a [facility] MyChart account or go to [email or web address]  Consent: (Patient) Mary Richards provided verbal consent for this virtual visit at the beginning of the encounter.  Current Medications:  Current Outpatient Medications:    albuterol (PROVENTIL) (2.5 MG/3ML) 0.083% nebulizer solution, Take 3 mLs (2.5 mg total) by nebulization every 6 (six) hours as needed for wheezing or shortness of breath., Disp: 150 mL, Rfl: 2   albuterol (VENTOLIN HFA) 108 (90 Base) MCG/ACT inhaler, INHALE 2 PUFFS INTO THE LUNGS EVERY 6 HOURS AS NEEDED FOR WHEEZING OR SHORTNESS OF BREATH., Disp: 6.7 g, Rfl: 1   azelastine (ASTELIN) 0.1 % nasal spray, Place 1 spray into both nostrils 2 (two) times daily. Use in each nostril as directed, Disp: 30 mL, Rfl: 3   budesonide-formoterol (SYMBICORT) 80-4.5 MCG/ACT inhaler, Inhale 2 puffs into the lungs 2 (two) times daily., Disp: 10.2 g, Rfl: 3   cetirizine (ZYRTEC) 10 MG tablet, Take 10 mg by mouth daily., Disp: , Rfl:    cholestyramine (QUESTRAN) 4 g packet, Take 1 packet (4 g total) by mouth 2 (two) times daily., Disp: 60 each, Rfl: 11   cyclobenzaprine (FLEXERIL) 5 MG tablet, Take 1 tablet (5 mg total) by mouth 3 (three) times daily as needed for muscle spasms., Disp: 30 tablet, Rfl: 0   famotidine (PEPCID) 40 MG tablet, Take 1 tablet (40 mg total) by mouth 2 (two) times daily., Disp: 60 tablet, Rfl: 5   fluticasone (FLONASE) 50 MCG/ACT nasal spray, Place 2 sprays into both nostrils daily., Disp: 16 g,  Rfl: 0   hydrOXYzine (ATARAX) 25 MG tablet, Take 1 tablet (25 mg total) by mouth at bedtime., Disp: 30 tablet, Rfl: 0   hydrOXYzine (ATARAX) 25 MG tablet, Take 1 tablet (25 mg total) by mouth 3 (three) times daily as needed., Disp: 90 tablet, Rfl: 3   meloxicam (MOBIC) 15 MG tablet, Take 1 tablet (15 mg total) by mouth daily as needed for pain., Disp: 30 tablet, Rfl: 1   metoCLOPramide (REGLAN) 5 MG tablet, Take 1 tablet (5 mg total) by mouth every 6 (six) hours as needed for up to 5 days for nausea., Disp: 20 tablet, Rfl: 0   mometasone (ELOCON) 0.1 % ointment, Apply 1 Application topically sparingly 2 (two) times daily as needed., Disp: 60 g, Rfl: 3   nortriptyline (PAMELOR) 25 MG capsule, Take 1 capsule (25 mg total) by mouth at bedtime., Disp: 90 capsule, Rfl: 1   predniSONE (STERAPRED UNI-PAK 21 TAB) 10 MG (21) TBPK tablet, Take by mouth daily. Take 6 tabs by mouth daily  for 1 days, then 5 tabs for 1 days, then 4 tabs for 1 days, then 3 tabs for 1 days, 2 tabs for 1 days, then 1 tab by mouth daily for 1 days, Disp: 21 tablet, Rfl: 0   triamcinolone cream (KENALOG) 0.1 %, Apply 1 Application topically 2 (two) times daily to areas of hives, Disp: 30 g, Rfl: 1   trimethoprim-polymyxin b (POLYTRIM) ophthalmic solution, Place 1 drop into the left eye every 6 (  six) hours for 5 days, Disp: 10 mL, Rfl: 0   Medications ordered in this encounter:  No orders of the defined types were placed in this encounter.    *If you need refills on other medications prior to your next appointment, please contact your pharmacy*  Follow-Up: Call back or seek an in-person evaluation if the symptoms worsen or if the condition fails to improve as anticipated.  [facility] [phone]  Other Instructions Please stop the Augmentin. Start the Doxycycline as directed  Increase fluid intake.  Use Saline nasal spray.  Take a daily multivitamin. Ok to continue your OTC medications.  Place a humidifier in  the bedroom.  Please call or return clinic if symptoms are not improving.  Sinusitis Sinusitis is redness, soreness, and swelling (inflammation) of the paranasal sinuses. Paranasal sinuses are air pockets within the bones of your face (beneath the eyes, the middle of the forehead, or above the eyes). In healthy paranasal sinuses, mucus is able to drain out, and air is able to circulate through them by way of your nose. However, when your paranasal sinuses are inflamed, mucus and air can become trapped. This can allow bacteria and other germs to grow and cause infection. Sinusitis can develop quickly and last only a short time (acute) or continue over a long period (chronic). Sinusitis that lasts for more than 12 weeks is considered chronic.  CAUSES  Causes of sinusitis include: Allergies. Structural abnormalities, such as displacement of the cartilage that separates your nostrils (deviated septum), which can decrease the air flow through your nose and sinuses and affect sinus drainage. Functional abnormalities, such as when the small hairs (cilia) that line your sinuses and help remove mucus do not work properly or are not present. SYMPTOMS  Symptoms of acute and chronic sinusitis are the same. The primary symptoms are pain and pressure around the affected sinuses. Other symptoms include: Upper toothache. Earache. Headache. Bad breath. Decreased sense of smell and taste. A cough, which worsens when you are lying flat. Fatigue. Fever. Thick drainage from your nose, which often is green and may contain pus (purulent). Swelling and warmth over the affected sinuses. DIAGNOSIS  Your caregiver will perform a physical exam. During the exam, your caregiver may: Look in your nose for signs of abnormal growths in your nostrils (nasal polyps). Tap over the affected sinus to check for signs of infection. View the inside of your sinuses (endoscopy) with a special imaging device with a light attached  (endoscope), which is inserted into your sinuses. If your caregiver suspects that you have chronic sinusitis, one or more of the following tests may be recommended: Allergy tests. Nasal culture A sample of mucus is taken from your nose and sent to a lab and screened for bacteria. Nasal cytology A sample of mucus is taken from your nose and examined by your caregiver to determine if your sinusitis is related to an allergy. TREATMENT  Most cases of acute sinusitis are related to a viral infection and will resolve on their own within 10 days. Sometimes medicines are prescribed to help relieve symptoms (pain medicine, decongestants, nasal steroid sprays, or saline sprays).  However, for sinusitis related to a bacterial infection, your caregiver will prescribe antibiotic medicines. These are medicines that will help kill the bacteria causing the infection.  Rarely, sinusitis is caused by a fungal infection. In theses cases, your caregiver will prescribe antifungal medicine. For some cases of chronic sinusitis, surgery is needed. Generally, these are cases  in which sinusitis recurs more than 3 times per year, despite other treatments. HOME CARE INSTRUCTIONS  Drink plenty of water. Water helps thin the mucus so your sinuses can drain more easily. Use a humidifier. Inhale steam 3 to 4 times a day (for example, sit in the bathroom with the shower running). Apply a warm, moist washcloth to your face 3 to 4 times a day, or as directed by your caregiver. Use saline nasal sprays to help moisten and clean your sinuses. Take over-the-counter or prescription medicines for pain, discomfort, or fever only as directed by your caregiver. SEEK IMMEDIATE MEDICAL CARE IF: You have increasing pain or severe headaches. You have nausea, vomiting, or drowsiness. You have swelling around your face. You have vision problems. You have a stiff neck. You have difficulty breathing. MAKE SURE YOU:  Understand these  instructions. Will watch your condition. Will get help right away if you are not doing well or get worse. Document Released: 10/31/2005 Document Revised: 01/23/2012 Document Reviewed: 11/15/2011 [facility] Patient Information 2014 [place], [place].    If you have been instructed to have an in-person evaluation today at a local Urgent Care facility, please use the link below. It will take you to a list of all of our available [facility], including address, phone number and hours of operation. Please do not delay care.  [facility]  If you or a family member do not have a primary care provider, use the link below to schedule a visit and establish care. When you choose a [facility] primary care physician or advanced practice provider, you gain a long-term partner in health. Find a Primary Care Provider  Learn more about [facility] in-office and virtual care options: [facility] - Get Care Now

## 2023-11-25 ENCOUNTER — Telehealth: Payer: Commercial Managed Care - PPO | Admitting: Family Medicine

## 2023-11-25 DIAGNOSIS — N760 Acute vaginitis: Secondary | ICD-10-CM

## 2023-11-25 MED ORDER — FLUCONAZOLE 150 MG PO TABS
150.0000 mg | ORAL_TABLET | Freq: Once | ORAL | 0 refills | Status: AC
Start: 2023-11-25 — End: 2023-11-25

## 2023-11-25 NOTE — Progress Notes (Signed)

## 2023-12-06 ENCOUNTER — Encounter: Payer: 59 | Admitting: Family

## 2023-12-07 ENCOUNTER — Ambulatory Visit: Payer: 59 | Admitting: Allergy and Immunology

## 2023-12-11 ENCOUNTER — Other Ambulatory Visit: Payer: Self-pay

## 2023-12-11 ENCOUNTER — Ambulatory Visit (INDEPENDENT_AMBULATORY_CARE_PROVIDER_SITE_OTHER): Payer: Commercial Managed Care - PPO | Admitting: Family

## 2023-12-11 ENCOUNTER — Encounter: Payer: Self-pay | Admitting: Family

## 2023-12-11 VITALS — BP 130/80 | HR 70 | Temp 98.8°F | Ht 67.0 in | Wt 211.0 lb

## 2023-12-11 DIAGNOSIS — Z13220 Encounter for screening for lipoid disorders: Secondary | ICD-10-CM | POA: Diagnosis not present

## 2023-12-11 DIAGNOSIS — F411 Generalized anxiety disorder: Secondary | ICD-10-CM

## 2023-12-11 DIAGNOSIS — Z0000 Encounter for general adult medical examination without abnormal findings: Secondary | ICD-10-CM | POA: Diagnosis not present

## 2023-12-11 DIAGNOSIS — Z8639 Personal history of other endocrine, nutritional and metabolic disease: Secondary | ICD-10-CM | POA: Diagnosis not present

## 2023-12-11 DIAGNOSIS — M25561 Pain in right knee: Secondary | ICD-10-CM | POA: Diagnosis not present

## 2023-12-11 DIAGNOSIS — Z136 Encounter for screening for cardiovascular disorders: Secondary | ICD-10-CM

## 2023-12-11 MED ORDER — BUPROPION HCL ER (XL) 150 MG PO TB24
150.0000 mg | ORAL_TABLET | ORAL | 3 refills | Status: DC
  Filled 2023-12-11: qty 90, 90d supply, fill #0
  Filled 2024-05-15: qty 90, 90d supply, fill #1

## 2023-12-11 MED ORDER — TRAZODONE HCL 50 MG PO TABS
25.0000 mg | ORAL_TABLET | Freq: Every evening | ORAL | 1 refills | Status: AC | PRN
Start: 2023-12-11 — End: ?
  Filled 2023-12-11: qty 30, 30d supply, fill #0

## 2023-12-11 NOTE — Progress Notes (Signed)
Assessment & Plan:  Routine physical examination Assessment & Plan: Deferred clinical breast exam, Pap smear due to patient preference and she is following with GYN.  Advised her to make appointment for Pap smear.    Orders: -     CBC with Differential/Platelet; Future -     Comprehensive metabolic panel; Future  GAD (generalized anxiety disorder) -     buPROPion HCl ER (XL); Take 1 tablet (150 mg total) by mouth every morning.  Dispense: 90 tablet; Refill: 3 -     traZODone HCl; Take 0.5-1 tablets (25-50 mg total) by mouth at bedtime as needed for sleep.  Dispense: 30 tablet; Refill: 1 -     TSH; Future  History of vitamin D deficiency -     VITAMIN D 25 Hydroxy (Vit-D Deficiency, Fractures); Future  Encounter for lipid screening for cardiovascular disease -     Lipid panel; Future  Acute pain of right knee Assessment & Plan: Acute.  Question patellofemoral syndrome.  Advised conservative therapy NSAID, exercise.  She will let me know if she would like a referral to orthopedic.  She previously has seen Dr. Hyacinth Meeker      Return precautions given.   Risks, benefits, and alternatives of the medications and treatment plan prescribed today were discussed, and patient expressed understanding.   Education regarding symptom management and diagnosis given to patient on AVS either electronically or printed.  Return in about 6 weeks (around 01/22/2024).  [name], FNP  Subjective:    Patient ID: Mary Richards, female    DOB: 06-Sep-1986, 38 y.o.   MRN: [id number]  CC: Mary Richards is a 38 y.o. female who presents today for physical exam.    HPI: Complains of right knee pain x 10 days , unchanged.      No injury Pain with bending Knee is not giving out.  No pain with walking  Icing, ibuprofen and soft brace with relief She has seen Dr Hyacinth Meeker for left knee in the past.   She complains of increased anxiety, depression.  Lost of family member.  She  has not used atarax for anxiety. She has been on wellbutrin, zoloft, trazodone  in the past.  Endorses emotional eating.  No ho seizure, anorexia, bulimia   Colorectal Cancer Screening: UTD, Dr Tobi Bastos 4/82/22; repeat in 7 years Breast Cancer Screening: No early family history of breast cancer Cervical Cancer Screening: due; last obtained 08/09/22 [facility] [place], negative HPV; 12/12/18 negative malignancy, negative HPV. follows with GYN Mary Richards        Tetanus - UTD        Exercise: No regular exercise.   Alcohol use: Occasional Smoking/tobacco use: Nonsmoker.    Health Maintenance  Topic Date Due   COVID-19 Vaccine (3 - 2024-25 season) 07/16/2023   Flu Shot  02/12/2024*   Pneumococcal Vaccination (1 of 2 - PCV) 12/10/2024*   Pap with HPV screening  08/10/2027   Colon Cancer Screening  02/20/2028   DTaP/Tdap/Td vaccine (3 - Td or Tdap) 11/12/2031   Hepatitis C Screening  Completed   HPV Vaccine  Aged Out   HIV Screening  Discontinued  *Topic was postponed. The date shown is not the original due date.    ALLERGIES: Azithromycin, Sulfa antibiotics, Ciprofloxacin, Macrobid [nitrofurantoin], Moxifloxacin hcl, Bactrim [sulfamethoxazole-trimethoprim], and Wound dressing adhesive  Current Outpatient Medications on File Prior to Visit  Medication Sig Dispense Refill   albuterol (PROVENTIL) (2.5 MG/3ML) 0.083% nebulizer solution Take 3 mLs (2.5 mg  total) by nebulization every 6 (six) hours as needed for wheezing or shortness of breath. 150 mL 2   azelastine (ASTELIN) 0.1 % nasal spray Place 1 spray into both nostrils 2 (two) times daily. Use in each nostril as directed 30 mL 3   budesonide-formoterol (SYMBICORT) 80-4.5 MCG/ACT inhaler Inhale 2 puffs into the lungs 2 (two) times daily. 10.2 g 3   cetirizine (ZYRTEC) 10 MG tablet Take 10 mg by mouth daily.     cholestyramine (QUESTRAN) 4 g packet Take 1 packet (4 g total) by mouth 2 (two) times daily. 60 each 11   famotidine (PEPCID) 40 MG tablet  Take 1 tablet (40 mg total) by mouth 2 (two) times daily. 60 tablet 5   fluticasone (FLONASE) 50 MCG/ACT nasal spray Place 2 sprays into both nostrils daily. 16 g 0   hydrOXYzine (ATARAX) 25 MG tablet Take 1 tablet (25 mg total) by mouth at bedtime. 30 tablet 0   hydrOXYzine (ATARAX) 25 MG tablet Take 1 tablet (25 mg total) by mouth 3 (three) times daily as needed. 90 tablet 3   meloxicam (MOBIC) 15 MG tablet Take 1 tablet (15 mg total) by mouth daily as needed for pain. 30 tablet 1   mometasone (ELOCON) 0.1 % ointment Apply 1 Application topically sparingly 2 (two) times daily as needed. 60 g 3   nortriptyline (PAMELOR) 25 MG capsule Take 1 capsule (25 mg total) by mouth at bedtime. 90 capsule 1   triamcinolone cream (KENALOG) 0.1 % Apply 1 Application topically 2 (two) times daily to areas of hives 30 g 1   trimethoprim-polymyxin b (POLYTRIM) ophthalmic solution Place 1 drop into the left eye every 6 (six) hours for 5 days 10 mL 0   albuterol (VENTOLIN HFA) 108 (90 Base) MCG/ACT inhaler INHALE 2 PUFFS INTO THE LUNGS EVERY 6 HOURS AS NEEDED FOR WHEEZING OR SHORTNESS OF BREATH. 6.7 g 1   metoCLOPramide (REGLAN) 5 MG tablet Take 1 tablet (5 mg total) by mouth every 6 (six) hours as needed for up to 5 days for nausea. 20 tablet 0   No current facility-administered medications on file prior to visit.    Review of Systems  Constitutional:  Negative for chills and fever.  Respiratory:  Negative for cough.   Cardiovascular:  Negative for chest pain and palpitations.  Gastrointestinal:  Negative for nausea and vomiting.  Musculoskeletal:  Positive for arthralgias.      Objective:    BP 130/80   Pulse 70   Temp 98.8 F (37.1 C) (Oral)   Ht 5\' 7"  (1.702 m)   Wt 211 lb (95.7 kg)   LMP  (LMP Unknown)   SpO2 97%   BMI 33.05 kg/m   BP Readings from Last 3 Encounters:  12/11/23 130/80  10/31/23 114/78  08/30/23 116/78   Wt Readings from Last 3 Encounters:  12/11/23 211 lb (95.7 kg)   08/30/23 204 lb 6.4 oz (92.7 kg)  08/28/23 200 lb (90.7 kg)    Physical Exam Vitals reviewed.  Constitutional:      Appearance: She is well-developed.  Eyes:     Conjunctiva/sclera: Conjunctivae normal.  Cardiovascular:     Rate and Rhythm: Normal rate and regular rhythm.     Pulses: Normal pulses.     Heart sounds: Normal heart sounds.  Pulmonary:     Effort: Pulmonary effort is normal.     Breath sounds: Normal breath sounds. No wheezing, rhonchi or rales.  Musculoskeletal:     Right  knee: No swelling or bony tenderness. Normal range of motion.     Left knee: No swelling or bony tenderness. Normal range of motion.     Comments: Bilateral knees are symmetric. No effusion appreciated. No increase in warmth or erythema. Right knee:  Able to extend to -5 to 10 degrees and flex to 110 degrees. No catching with McMurray maneuver.   No calf tenderness of lower leg edema bilaterally.    Skin:    General: Skin is warm and dry.  Neurological:     Mental Status: She is alert.  Psychiatric:        Speech: Speech normal.        Behavior: Behavior normal.        Thought Content: Thought content normal.

## 2023-12-11 NOTE — Patient Instructions (Addendum)
You are due for Pap smear.  Start wellbutrin ; monitor for increased anxiety  Trial exercise ; if pain worsens, please stop exercise below and we will arrange follow up with Dr Hyacinth Meeker.   Continue ice, NSAIDS.    Knee Exercises Ask your health care provider which exercises are safe for you. Do exercises exactly as told by your health care provider and adjust them as directed. It is normal to feel mild stretching, pulling, tightness, or discomfort as you do these exercises. Stop right away if you feel sudden pain or your pain gets worse. Do not begin these exercises until told by your health care provider. Stretching and range-of-motion exercises These exercises warm up your muscles and joints and improve the movement and flexibility of your knee. These exercises also help to relieve pain and swelling. Knee extension, prone  Lie on your abdomen (prone position) on a bed. Place your left / right knee just beyond the edge of the surface so your knee is not on the bed. You can put a towel under your left / right thigh just above your kneecap for comfort. Relax your leg muscles and allow gravity to straighten your knee (extension). You should feel a stretch behind your left / right knee. Hold this position for __________ seconds. Scoot up so your knee is supported between repetitions. Repeat __________ times. Complete this exercise __________ times a day. Knee flexion, active  Lie on your back with both legs straight. If this causes back discomfort, bend your left / right knee so your foot is flat on the floor. Slowly slide your left / right heel back toward your buttocks. Stop when you feel a gentle stretch in the front of your knee or thigh (flexion). Hold this position for __________ seconds. Slowly slide your left / right heel back to the starting position. Repeat __________ times. Complete this exercise __________ times a day. Quadriceps stretch, prone  Lie on your abdomen on a firm  surface, such as a bed or padded floor. Bend your left / right knee and hold your ankle. If you cannot reach your ankle or pant leg, loop a belt around your foot and grab the belt instead. Gently pull your heel toward your buttocks. Your knee should not slide out to the side. You should feel a stretch in the front of your thigh and knee (quadriceps). Hold this position for __________ seconds. Repeat __________ times. Complete this exercise __________ times a day. Hamstring, supine  Lie on your back (supine position). Loop a belt or towel over the ball of your left / right foot. The ball of your foot is on the walking surface, right under your toes. Straighten your left / right knee and slowly pull on the belt to raise your leg until you feel a gentle stretch behind your knee (hamstring). Do not let your knee bend while you do this. Keep your other leg flat on the floor. Hold this position for __________ seconds. Repeat __________ times. Complete this exercise __________ times a day. Strengthening exercises These exercises build strength and endurance in your knee. Endurance is the ability to use your muscles for a long time, even after they get tired. Quadriceps, isometric This exercise strengthens the muscles in front of your thigh (quadriceps) without moving your knee joint (isometric). Lie on your back with your left / right leg extended and your other knee bent. Put a rolled towel or small pillow under your knee if told by your health care provider. Slowly  tense the muscles in the front of your left / right thigh. You should see your kneecap slide up toward your hip or see increased dimpling just above the knee. This motion will push the back of the knee toward the floor. For __________ seconds, hold the muscle as tight as you can without increasing your pain. Relax the muscles slowly and completely. Repeat __________ times. Complete this exercise __________ times a day. Straight leg  raises This exercise strengthens the muscles in front of your thigh (quadriceps) and the muscles that move your hips (hip flexors). Lie on your back with your left / right leg extended and your other knee bent. Tense the muscles in the front of your left / right thigh. You should see your kneecap slide up or see increased dimpling just above the knee. Your thigh may even shake a bit. Keep these muscles tight as you raise your leg 4-6 inches (10-15 cm) off the floor. Do not let your knee bend. Hold this position for __________ seconds. Keep these muscles tense as you lower your leg. Relax your muscles slowly and completely after each repetition. Repeat __________ times. Complete this exercise __________ times a day. Hamstring, isometric  Lie on your back on a firm surface. Bend your left / right knee about __________ degrees. Dig your left / right heel into the surface as if you are trying to pull it toward your buttocks. Tighten the muscles in the back of your thighs (hamstring) to "dig" as hard as you can without increasing any pain. Hold this position for __________ seconds. Release the tension gradually and allow your muscles to relax completely for __________ seconds after each repetition. Repeat __________ times. Complete this exercise __________ times a day. Hamstring curls If told by your health care provider, do this exercise while wearing ankle weights. Begin with __________lb / kg weights. Then increase the weight by 1 lb (0.5 kg) increments. Do not wear ankle weights that are more than __________lb / kg. Lie on your abdomen with your legs straight. Bend your left / right knee as far as you can without feeling pain. Keep your hips flat against the floor. Hold this position for __________ seconds. Slowly lower your leg to the starting position. Repeat __________ times. Complete this exercise __________ times a day. Squats This exercise strengthens the muscles in front of your thigh  and knee (quadriceps). Stand in front of a table, with your feet and knees pointing straight ahead. You may rest your hands on the table for balance but not for support. Slowly bend your knees and lower your hips like you are going to sit in a chair. Keep your weight over your heels, not over your toes. Keep your lower legs upright so they are parallel with the table legs. Do not let your hips go lower than your knees. Do not bend lower than told by your health care provider. If your knee pain increases, do not bend as low. Hold the squat position for __________ seconds. Slowly push with your legs to return to standing. Do not use your hands to pull yourself to standing. Repeat __________ times. Complete this exercise __________ times a day. Wall slides This exercise strengthens the muscles in front of your thigh and knee (quadriceps). Lean your back against a smooth wall or door, and walk your feet out 18-24 inches (46-61 cm) from it. Place your feet hip-width apart. Slowly slide down the wall or door until your knees bend __________ degrees. Keep your knees  over your heels, not over your toes. Keep your knees in line with your hips. Hold this position for __________ seconds. Repeat __________ times. Complete this exercise __________ times a day. Straight leg raises, side-lying This exercise strengthens the muscles that rotate the leg at the hip and move it away from your body (hip abductors). Lie on your side with your left / right leg in the top position. Lie so your head, shoulder, knee, and hip line up. You may bend your bottom knee to help you keep your balance. Roll your hips slightly forward so your hips are stacked directly over each other and your left / right knee is facing forward. Leading with your heel, lift your top leg 4-6 inches (10-15 cm). You should feel the muscles in your outer hip lifting. Do not let your foot drift forward. Do not let your knee roll toward the  ceiling. Hold this position for __________ seconds. Slowly return your leg to the starting position. Let your muscles relax completely after each repetition. Repeat __________ times. Complete this exercise __________ times a day. Straight leg raises, prone This exercise stretches the muscles that move your hips away from the front of the pelvis (hip extensors). Lie on your abdomen on a firm surface. You can put a pillow under your hips if that is more comfortable. Tense the muscles in your buttocks and lift your left / right leg about 4-6 inches (10-15 cm). Keep your knee straight as you lift your leg. Hold this position for __________ seconds. Slowly lower your leg to the starting position. Let your leg relax completely after each repetition. Repeat __________ times. Complete this exercise __________ times a day. This information is not intended to replace advice given to you by your health care provider. Make sure you discuss any questions you have with your health care provider. Document Revised: 07/13/2021 Document Reviewed: 07/13/2021 Else[employer] Patient Education  2024 [employer]  Health Maintenance, Female Adopting a healthy lifestyle and getting preventive care are important in promoting health and wellness. Ask your health care provider about: The right schedule for you to have regular tests and exams. Things you can do on your own to prevent diseases and keep yourself healthy. What should I know about diet, weight, and exercise? Eat a healthy diet  Eat a diet that includes plenty of vegetables, fruits, low-fat dairy products, and lean protein. Do not eat a lot of foods that are high in solid fats, added sugars, or sodium. Maintain a healthy weight Body mass index (BMI) is used to identify weight problems. It estimates body fat based on height and weight. Your health care provider can help determine your BMI and help you achieve or maintain a healthy weight. Get regular  exercise Get regular exercise. This is one of the most important things you can do for your health. Most adults should: Exercise for at least 150 minutes each week. The exercise should increase your heart rate and make you sweat (moderate-intensity exercise). Do strengthening exercises at least twice a week. This is in addition to the moderate-intensity exercise. Spend less time sitting. Even light physical activity can be beneficial. Watch cholesterol and blood lipids Have your blood tested for lipids and cholesterol at 37 years of age, then have this test every 5 years. Have your cholesterol levels checked more often if: Your lipid or cholesterol levels are high. You are older than 37 years of age. You are at high risk for heart disease. What should I know about  cancer screening? Depending on your health history and family history, you may need to have cancer screening at various ages. This may include screening for: Breast cancer. Cervical cancer. Colorectal cancer. Skin cancer. Lung cancer. What should I know about heart disease, diabetes, and high blood pressure? Blood pressure and heart disease High blood pressure causes heart disease and increases the risk of stroke. This is more likely to develop in people who have high blood pressure readings or are overweight. Have your blood pressure checked: Every 3-5 years if you are 45-59 years of age. Every year if you are 17 years old or older. Diabetes Have regular diabetes screenings. This checks your fasting blood sugar level. Have the screening done: Once every three years after age 37 if you are at a normal weight and have a low risk for diabetes. More often and at a younger age if you are overweight or have a high risk for diabetes. What should I know about preventing infection? Hepatitis B If you have a higher risk for hepatitis B, you should be screened for this virus. Talk with your health care provider to find out if you are at  risk for hepatitis B infection. Hepatitis C Testing is recommended for: Everyone born from [birth year] through 1964. Anyone with known risk factors for hepatitis C. Sexually transmitted infections (STIs) Get screened for STIs, including gonorrhea and chlamydia, if: You are sexually active and are younger than 37 years of age. You are older than 37 years of age and your health care provider tells you that you are at risk for this type of infection. Your sexual activity has changed since you were last screened, and you are at increased risk for chlamydia or gonorrhea. Ask your health care provider if you are at risk. Ask your health care provider about whether you are at high risk for HIV. Your health care provider may recommend a prescription medicine to help prevent HIV infection. If you choose to take medicine to prevent HIV, you should first get tested for HIV. You should then be tested every 3 months for as long as you are taking the medicine. Pregnancy If you are about to stop having your period (premenopausal) and you may become pregnant, seek counseling before you get pregnant. Take 400 to 800 micrograms (mcg) of folic acid every day if you become pregnant. Ask for birth control (contraception) if you want to prevent pregnancy. Osteoporosis and menopause Osteoporosis is a disease in which the bones lose minerals and strength with aging. This can result in bone fractures. If you are 47 years old or older, or if you are at risk for osteoporosis and fractures, ask your health care provider if you should: Be screened for bone loss. Take a calcium or vitamin D supplement to lower your risk of fractures. Be given hormone replacement therapy (HRT) to treat symptoms of menopause. Follow these instructions at home: Alcohol use Do not drink alcohol if: Your health care provider tells you not to drink. You are pregnant, may be pregnant, or are planning to become pregnant. If you drink alcohol: Limit  how much you have to: 0-1 drink a day. Know how much alcohol is in your drink. In the [place] one drink equals one 12 oz bottle of beer (355 mL), one 5 oz glass of wine (148 mL), or one 1 oz glass of hard liquor (44 mL). Lifestyle Do not use any products that contain nicotine or tobacco. These products include cigarettes, chewing tobacco, and vaping devices,  such as e-cigarettes. If you need help quitting, ask your health care provider. Do not use street drugs. Do not share needles. Ask your health care provider for help if you need support or information about quitting drugs. General instructions Schedule regular health, dental, and eye exams. Stay current with your vaccines. Tell your health care provider if: You often feel depressed. You have ever been abused or do not feel safe at home. Summary Adopting a healthy lifestyle and getting preventive care are important in promoting health and wellness. Follow your health care provider's instructions about healthy diet, exercising, and getting tested or screened for diseases. Follow your health care provider's instructions on monitoring your cholesterol and blood pressure. This information is not intended to replace advice given to you by your health care provider. Make sure you discuss any questions you have with your health care provider. Document Revised: 03/22/2021 Document Reviewed: 03/22/2021 Elsevier Patient Education  2024 [employer].

## 2023-12-12 ENCOUNTER — Encounter: Payer: Self-pay | Admitting: Family

## 2023-12-12 ENCOUNTER — Other Ambulatory Visit
Admission: RE | Admit: 2023-12-12 | Discharge: 2023-12-12 | Disposition: A | Discharge status: Home / Self Care | Payer: Commercial Managed Care - PPO | Attending: Family | Admitting: Family

## 2023-12-12 DIAGNOSIS — F411 Generalized anxiety disorder: Secondary | ICD-10-CM | POA: Insufficient documentation

## 2023-12-12 DIAGNOSIS — Z13220 Encounter for screening for lipoid disorders: Secondary | ICD-10-CM | POA: Diagnosis not present

## 2023-12-12 DIAGNOSIS — Z136 Encounter for screening for cardiovascular disorders: Secondary | ICD-10-CM | POA: Insufficient documentation

## 2023-12-12 DIAGNOSIS — Z8639 Personal history of other endocrine, nutritional and metabolic disease: Secondary | ICD-10-CM | POA: Diagnosis not present

## 2023-12-12 DIAGNOSIS — Z0000 Encounter for general adult medical examination without abnormal findings: Secondary | ICD-10-CM | POA: Insufficient documentation

## 2023-12-12 LAB — CBC WITH DIFFERENTIAL/PLATELET
Abs Immature Granulocytes: 0.05 10*3/uL (ref 0.00–0.07)
Basophils Absolute: 0.1 10*3/uL (ref 0.0–0.1)
Basophils Relative: 1 %
Eosinophils Absolute: 0.3 10*3/uL (ref 0.0–0.5)
Eosinophils Relative: 3 %
HCT: 41.6 % (ref 36.0–46.0)
Hemoglobin: 14.1 g/dL (ref 12.0–15.0)
Immature Granulocytes: 1 %
Lymphocytes Relative: 41 %
Lymphs Abs: 3.5 10*3/uL (ref 0.7–4.0)
MCH: 29.9 pg (ref 26.0–34.0)
MCHC: 33.9 g/dL (ref 30.0–36.0)
MCV: 88.1 fL (ref 80.0–100.0)
Monocytes Absolute: 0.8 10*3/uL (ref 0.1–1.0)
Monocytes Relative: 9 %
Neutro Abs: 4 10*3/uL (ref 1.7–7.7)
Neutrophils Relative %: 45 %
Platelets: 315 10*3/uL (ref 150–400)
RBC: 4.72 MIL/uL (ref 3.87–5.11)
RDW: 12.6 % (ref 11.5–15.5)
WBC: 8.6 10*3/uL (ref 4.0–10.5)
nRBC: 0 % (ref 0.0–0.2)

## 2023-12-12 LAB — COMPREHENSIVE METABOLIC PANEL
ALT: 15 U/L (ref 0–44)
AST: 19 U/L (ref 15–41)
Albumin: 3.9 g/dL (ref 3.5–5.0)
Alkaline Phosphatase: 59 U/L (ref 38–126)
Anion gap: 6 (ref 5–15)
BUN: 21 mg/dL — ABNORMAL HIGH (ref 6–20)
CO2: 25 mmol/L (ref 22–32)
Calcium: 8.9 mg/dL (ref 8.9–10.3)
Chloride: 107 mmol/L (ref 98–111)
Creatinine, Ser: 0.54 mg/dL (ref 0.44–1.00)
GFR, Estimated: >60 mL/min (ref >60)
Glucose, Bld: 90 mg/dL (ref 70–99)
Potassium: 4.1 mmol/L (ref 3.5–5.1)
Sodium: 138 mmol/L (ref 135–145)
Total Bilirubin: 1.1 mg/dL (ref 0.0–1.2)
Total Protein: 6.5 g/dL (ref 6.5–8.1)

## 2023-12-12 LAB — VITAMIN D 25 HYDROXY (VIT D DEFICIENCY, FRACTURES): Vit D, 25-Hydroxy: 54.75 ng/mL (ref 30–100)

## 2023-12-12 LAB — TSH: TSH: 2.531 u[IU]/mL (ref 0.350–4.500)

## 2023-12-12 LAB — LIPID PANEL
Cholesterol: 156 mg/dL (ref 0–200)
HDL: 58 mg/dL (ref >40)
LDL Cholesterol: 86 mg/dL (ref 0–99)
Total CHOL/HDL Ratio: 2.7 {ratio}
Triglycerides: 58 mg/dL (ref <150)
VLDL: 12 mg/dL (ref 0–40)

## 2023-12-15 DIAGNOSIS — M25561 Pain in right knee: Secondary | ICD-10-CM | POA: Insufficient documentation

## 2023-12-15 NOTE — Assessment & Plan Note (Signed)
Deferred clinical breast exam, Pap smear due to patient preference and she is following with GYN.  Advised her to make appointment for Pap smear.

## 2023-12-15 NOTE — Assessment & Plan Note (Signed)
Acute.  Question patellofemoral syndrome.  Advised conservative therapy NSAID, exercise.  She will let me know if she would like a referral to orthopedic.  She previously has seen Dr. Hyacinth Meeker

## 2024-01-03 ENCOUNTER — Ambulatory Visit: Payer: Commercial Managed Care - PPO | Admitting: Licensed Practical Nurse

## 2024-01-12 DIAGNOSIS — M25561 Pain in right knee: Secondary | ICD-10-CM | POA: Diagnosis not present

## 2024-01-12 DIAGNOSIS — M25562 Pain in left knee: Secondary | ICD-10-CM | POA: Diagnosis not present

## 2024-01-14 ENCOUNTER — Other Ambulatory Visit: Payer: Self-pay

## 2024-01-14 MED ORDER — PREDNISONE 5 MG PO TABS
ORAL_TABLET | ORAL | 0 refills | Status: AC
  Filled 2024-01-14: qty 48, 12d supply, fill #0

## 2024-01-15 ENCOUNTER — Other Ambulatory Visit: Payer: Self-pay | Admitting: Family

## 2024-01-15 ENCOUNTER — Ambulatory Visit: Payer: 59 | Admitting: Allergy and Immunology

## 2024-01-15 ENCOUNTER — Other Ambulatory Visit: Payer: Self-pay

## 2024-01-15 DIAGNOSIS — B9789 Other viral agents as the cause of diseases classified elsewhere: Secondary | ICD-10-CM

## 2024-01-15 MED FILL — Fluticasone Propionate Nasal Susp 50 MCG/ACT: NASAL | 30 days supply | Qty: 16 | Fill #0 | Status: CN

## 2024-01-22 ENCOUNTER — Telehealth: Payer: Commercial Managed Care - PPO | Admitting: Family

## 2024-01-26 ENCOUNTER — Other Ambulatory Visit: Payer: Self-pay

## 2024-02-08 ENCOUNTER — Other Ambulatory Visit (HOSPITAL_COMMUNITY)
Admission: RE | Admit: 2024-02-08 | Discharge: 2024-02-08 | Disposition: A | Discharge status: Home / Self Care | Source: Ambulatory Visit | Attending: Licensed Practical Nurse | Admitting: Licensed Practical Nurse

## 2024-02-08 ENCOUNTER — Ambulatory Visit: Payer: Commercial Managed Care - PPO | Admitting: Licensed Practical Nurse

## 2024-02-08 ENCOUNTER — Encounter: Payer: Self-pay | Admitting: Licensed Practical Nurse

## 2024-02-08 VITALS — BP 133/64 | HR 84 | Ht 67.0 in | Wt 208.3 lb

## 2024-02-08 DIAGNOSIS — Z01419 Encounter for gynecological examination (general) (routine) without abnormal findings: Secondary | ICD-10-CM | POA: Diagnosis not present

## 2024-02-08 DIAGNOSIS — Z124 Encounter for screening for malignant neoplasm of cervix: Secondary | ICD-10-CM | POA: Insufficient documentation

## 2024-02-08 NOTE — Progress Notes (Signed)
 Gynecology Annual Exam   PCP: Allegra Grana, FNP  Chief Complaint:  Chief Complaint  Patient presents with   Gynecologic Exam    History of Present Illness: Patient is a 38 y.o. [phone] presents for annual exam. The patient reports her cycles have been "weird for the last 2 years" they can be a little early or late and at times she will spot a few days before the start of the cycle, or the cycle with "linger" with light bleeding.   Breast concerns" Her older sister recently found a lump in her breast, she had a biopsy which showed no cancer. Her younger sister found a suspicious area, her US showed nothing to be concerned about but was recommended to return in 6 months. Denies family hx of breast cancer. Ahriana does SBE, has not found any concerning areas.    LMP: Patient's last menstrual period was 01/16/2024 (exact date). Average Interval: irregular, 27-31days Duration of flow: 8 days Heavy Menses: no Clots: no Intermenstrual Bleeding: no Postcoital Bleeding: no Dysmenorrhea: mild cramping  The patient is sexually active with 1 female partner. She currently uses vasectomy for contraception. She has occasional  dyspareunia.  The patient does perform self breast exams.  There is no notable family history of breast or ovarian cancer in her family.  The patient wears seatbelts: yes.   The patient has regular exercise:  walking .    The patient reports current symptoms of depression.  Managed with Wellbutrin  Works as a Psychologist, sport and exercise and [place] Lives with husband and kids, feels safe  PCP: last seen January  Dental up to date Eye exam up to date, wears glasses  Colonoscopy showed 2 polyps    Review of Systems: ROS see HPI  Past Medical History:  Patient Active Problem List   Diagnosis Date Noted Date Diagnosed   Right knee pain 12/15/2023    Syncope and collapse 08/25/2023    Vertigo 06/30/2023    Sinusitis 06/30/2023    Respiratory illness 04/13/2023     Pneumonia 03/06/2023    Persistent cough 02/16/2023    Urticaria 02/08/2023    Back pain 01/27/2023    Intractable migraine 09/02/2022    Dysuria 07/28/2022 08/31/2022   URI with cough and congestion 05/23/2022 08/31/2022   History of urinary stone 04/24/2022 08/31/2022   Irritable bowel syndrome without diarrhea 04/24/2022 08/31/2022   Irritant contact dermatitis, unspecified cause 04/24/2022 08/31/2022   Asthma during pregnancy 05/28/2021    Routine physical examination 01/11/2021    Bilateral impacted cerumen 06/05/2020    Obesity (BMI 30-39.9) 04/29/2020    GAD (generalized anxiety disorder) 07/08/2019    Chronic low back pain 07/08/2019     Past Surgical History:  Past Surgical History:  Procedure Laterality Date   CHOLECYSTECTOMY     COLONOSCOPY WITH PROPOFOL N/A 02/19/2021   Procedure: COLONOSCOPY WITH PROPOFOL;  Surgeon: Wyline Mood, MD;  Location: [facility] ENDOSCOPY;  Service: Gastroenterology;  Laterality: N/A;   DIAGNOSTIC LAPAROSCOPY WITH REMOVAL OF ECTOPIC PREGNANCY Left 01/23/2021   Procedure: DIAGNOSTIC LAPAROSCOPY WITH REMOVAL OF ECTOPIC PREGNANCY;  Surgeon: Conard Novak, MD;  Location: [facility] ORS;  Service: Gynecology;  Laterality: Left;   PILONIDAL CYST EXCISION     TONSILECTOMY, ADENOIDECTOMY, BILATERAL MYRINGOTOMY AND TUBES     TYMPANOPLASTY Right 05/10/2019   Procedure: TYMPANOPLASTY;  Surgeon: Linus Salmons, MD;  Location: [facility];  Service: ENT;  Laterality: Right;    Gynecologic History:  Patient's last  menstrual period was 01/16/2024 (exact date). Contraception: vasectomy Last Pap: Results were: ASCUS with NEGATIVE high risk HPV 2023  Obstetric History: [id number]  Family History:  Family History  Problem Relation Age of Onset   Irritable bowel syndrome Mother    Brain cancer Father 49       Glioblastoma, died one year after dx   Valvular heart disease Sister        Non Cancerous breast mass (both sisters)   COPD Maternal Grandfather     Brain cancer Paternal Grandmother 6       unsure what type, she was not treated   Thyroid cancer Neg Hx    Colon cancer Neg Hx    Breast cancer Neg Hx     Social History:  Social History   Socioeconomic History   Marital status: Married    Spouse name: Designer, fashion/clothing   Number of children: Not on file   Years of education: Not on file   Highest education level: Some college, no degree  Occupational History   Not on file  Tobacco Use   Smoking status: Never   Smokeless tobacco: Never  Vaping Use   Vaping status: Never Used  Substance and Sexual Activity   Alcohol use: Yes    Alcohol/week: 2.0 standard drinks of alcohol    Types: 2 Standard drinks or equivalent per week    Comment: Social    Drug use: Never   Sexual activity: Yes    Birth control/protection: Surgical    Comment: spouse  Other Topics Concern   Not on file  Social History Narrative   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    2 children   31 year old ( boy)   21 month old ( boy)   Social Drivers of Corporate investment banker Strain: Medium Risk (08/25/2023)   Overall Financial Resource Strain (CARDIA)    Difficulty of Paying Living Expenses: Somewhat hard  Food Insecurity: Food Insecurity Present (08/25/2023)   Hunger Vital Sign    Worried About Running Out of Food in the Last Year: Sometimes true    Ran Out of Food in the Last Year: Sometimes true  Transportation Needs: No Transportation Needs (08/25/2023)   PRAPARE - Administrator, Civil Service (Medical): No    Lack of Transportation (Non-Medical): No  Physical Activity: Insufficiently Active (08/25/2023)   Exercise Vital Sign    Days of Exercise per Week: 2 days    Minutes of Exercise per Session: 40 min  Stress: Stress Concern Present (08/25/2023)   [employer] of Occupational Health - Occupational Stress Questionnaire    Feeling of Stress : To some extent  Social Connections: Moderately Isolated (08/25/2023)   Social  Connection and Isolation Panel [NHANES]    Frequency of Communication with Friends and Family: Once a week    Frequency of Social Gatherings with Friends and Family: Once a week    Attends Religious Services: More than 4 times per year    Active Member of [employer] or Organizations: No    Attends Engineer, structural: Not on file    Marital Status: Married  Catering manager Violence: Not on file    Allergies:  Allergies  Allergen Reactions   Azithromycin Hives, Shortness Of Breath and Swelling    Throat swelling   Sulfa Antibiotics Hives   Ciprofloxacin Swelling   Macrobid [Nitrofurantoin] Hives    08/2023   Moxifloxacin Hcl Hives   Pamelor [Nortriptyline Hcl]  Hives    Face and lip swelling, throat starting itchy   Bactrim [Sulfamethoxazole-Trimethoprim] Rash   Wound Dressing Adhesive Rash    Dermabond    Medications: Prior to Admission medications   Medication Sig Start Date End Date Taking? Authorizing Provider  albuterol (PROVENTIL) (2.5 MG/3ML) 0.083% nebulizer solution Take 3 mLs (2.5 mg total) by nebulization every 6 (six) hours as needed for wheezing or shortness of breath. 02/24/23  Yes Arnett, Lyn Records, FNP  azelastine (ASTELIN) 0.1 % nasal spray Place 1 spray into both nostrils 2 (two) times daily. Use in each nostril as directed 12/14/21  Yes Arnett, Lyn Records, FNP  budesonide-formoterol (SYMBICORT) 80-4.5 MCG/ACT inhaler Inhale 2 puffs into the lungs 2 (two) times daily. 06/30/23  Yes Allegra Grana, FNP  buPROPion (WELLBUTRIN XL) 150 MG 24 hr tablet Take 1 tablet (150 mg total) by mouth every morning. 12/11/23  Yes Arnett, Lyn Records, FNP  cetirizine (ZYRTEC) 10 MG tablet Take 10 mg by mouth daily.   Yes [provider]  cholestyramine (QUESTRAN) 4 g packet Take 1 packet (4 g total) by mouth 2 (two) times daily. 06/28/22  Yes Midge Minium, MD  famotidine (PEPCID) 40 MG tablet Take 1 tablet (40 mg total) by mouth 2 (two) times daily. 08/29/23  Yes    fluticasone (FLONASE) 50 MCG/ACT nasal spray Place 2 sprays into both nostrils daily. 01/15/24  Yes Allegra Grana, FNP  hydrOXYzine (ATARAX) 25 MG tablet Take 1 tablet (25 mg total) by mouth at bedtime. 08/25/23  Yes Arnett, Lyn Records, FNP  hydrOXYzine (ATARAX) 25 MG tablet Take 1 tablet (25 mg total) by mouth 3 (three) times daily as needed. 10/11/23  Yes Allegra Grana, FNP  meloxicam (MOBIC) 15 MG tablet Take 1 tablet (15 mg total) by mouth daily as needed for pain. 01/27/23  Yes Allegra Grana, FNP  traZODone (DESYREL) 50 MG tablet Take 0.5-1 tablets (25-50 mg total) by mouth at bedtime as needed for sleep. 12/11/23  Yes Allegra Grana, FNP  albuterol (VENTOLIN HFA) 108 (90 Base) MCG/ACT inhaler INHALE 2 PUFFS INTO THE LUNGS EVERY 6 HOURS AS NEEDED FOR WHEEZING OR SHORTNESS OF BREATH. 11/30/22 11/30/23  Allegra Grana, FNP  metoCLOPramide (REGLAN) 5 MG tablet Take 1 tablet (5 mg total) by mouth every 6 (six) hours as needed for up to 5 days for nausea. 08/17/23 08/22/23  Viviano Simas, FNP  mometasone (ELOCON) 0.1 % ointment Apply 1 Application topically sparingly 2 (two) times daily as needed. Patient not taking: Reported on 02/08/2024 08/29/23     nortriptyline (PAMELOR) 25 MG capsule Take 1 capsule (25 mg total) by mouth at bedtime. Patient not taking: Reported on 02/08/2024 08/24/23   Allegra Grana, FNP  triamcinolone cream (KENALOG) 0.1 % Apply 1 Application topically 2 (two) times daily to areas of hives Patient not taking: Reported on 02/08/2024 08/25/23   Allegra Grana, FNP  trimethoprim-polymyxin b (POLYTRIM) ophthalmic solution Place 1 drop into the left eye every 6 (six) hours for 5 days Patient not taking: Reported on 02/08/2024 11/03/23   Margaretann Loveless, PA-C    Physical Exam Vitals: Blood pressure 133/64, pulse 84, height 5\' 7"  (1.702 m), weight 208 lb 4.8 oz (94.5 kg), last menstrual period 01/16/2024.  General: NAD HEENT: normocephalic,  anicteric Thyroid: no enlargement, no palpable nodules Pulmonary: No increased work of breathing, CTAB Cardiovascular: RRR, distal pulses 2+ Breast: Breast symmetrical, no tenderness, no palpable nodules or masses, no skin or nipple  retraction present, no nipple discharge.  No axillary or supraclavicular lymphadenopathy. Abdomen: NABS, soft, non-tender, non-distended.  Umbilicus without lesions.  No hepatomegaly, splenomegaly or masses palpable. No evidence of hernia  Genitourinary:  External: Normal external female genitalia.  Normal urethral meatus, normal Bartholin's and Skene's glands.    Vagina: Normal vaginal mucosa, no evidence of prolapse.  Good tone  Cervix: Grossly normal in appearance, no bleeding  Uterus: Non-enlarged, mobile, normal contour.  No CMT  Adnexa: ovaries non-enlarged, no adnexal masses  Rectal: deferred  Lymphatic: no evidence of inguinal lymphadenopathy Extremities: no edema, erythema, or tenderness Neurologic: Grossly intact Psychiatric: mood appropriate, affect full   Assessment: 38 y.o. [id number] routine annual exam  Plan: Problem List Items Addressed This Visit   None Visit Diagnoses       Well woman exam    -  Primary   Relevant Orders   Cytology - PAP     Cervical cancer screening       Relevant Orders   Cytology - PAP       2) STI screening  wasoffered and declined  2)  ASCCP guidelines and rational discussed.  Patient opts for  based on results  screening interval  3) Contraception - the patient is currently using  vasectomy.  She is happy with her current form of contraception and plans to continue  4) Routine healthcare maintenance including cholesterol, diabetes screening discussed managed by PCP  5) Return in about 1 year (around 02/07/2025).  Carie Caddy, CNM  [facility] 02/08/2024, 2:17 PM

## 2024-02-09 ENCOUNTER — Other Ambulatory Visit: Payer: Self-pay

## 2024-02-09 ENCOUNTER — Other Ambulatory Visit: Payer: Self-pay | Admitting: Family

## 2024-02-09 DIAGNOSIS — R0981 Nasal congestion: Secondary | ICD-10-CM

## 2024-02-09 MED FILL — Azelastine HCl Nasal Spray 0.1% (137 MCG/SPRAY): NASAL | 50 days supply | Qty: 30 | Fill #0 | Status: AC

## 2024-02-09 MED FILL — Fluticasone Propionate Nasal Susp 50 MCG/ACT: NASAL | 30 days supply | Qty: 16 | Fill #0 | Status: AC

## 2024-02-15 LAB — CYTOLOGY - PAP
Comment: NEGATIVE
Diagnosis: NEGATIVE
High risk HPV: NEGATIVE

## 2024-02-17 ENCOUNTER — Encounter: Payer: Self-pay | Admitting: Licensed Practical Nurse

## 2024-03-15 ENCOUNTER — Telehealth: Admitting: Physician Assistant

## 2024-03-15 DIAGNOSIS — J029 Acute pharyngitis, unspecified: Secondary | ICD-10-CM | POA: Diagnosis not present

## 2024-03-15 MED ORDER — CEPHALEXIN 500 MG PO CAPS: 500.0000 mg | ORAL_CAPSULE | Freq: Two times a day (BID) | ORAL | 0 refills | Status: AC

## 2024-03-15 NOTE — Progress Notes (Signed)
E-Visit for Sore Throat - Strep Symptoms ? ?We are sorry that you are not feeling well.  Here is how we plan to help! ? ?Based on what you have shared with me it is likely that you have strep pharyngitis.  Strep pharyngitis is inflammation and infection in the back of the throat.  This is an infection cause by bacteria and is treated with antibiotics.  I have prescribed Cephalexin 500 mg twice a day for 10 days. For throat pain, we recommend over the counter oral pain relief medications such as acetaminophen or aspirin, or anti-inflammatory medications such as ibuprofen or naproxen sodium. Topical treatments such as oral throat lozenges or sprays may be used as needed. Strep infections are not as easily transmitted as other respiratory infections, however we still recommend that you avoid close contact with loved ones, especially the very young and elderly.  Remember to wash your hands thoroughly throughout the day as this is the number one way to prevent the spread of infection and wipe down door knobs and counters with disinfectant. ? ? ?Home Care: ?Only take medications as instructed by your medical team. ?Complete the entire course of an antibiotic. ?Do not take these medications with alcohol. ?A steam or ultrasonic humidifier can help congestion.  You can place a towel over your head and breathe in the steam from hot water coming from a faucet. ?Avoid close contacts especially the very young and the elderly. ?Cover your mouth when you cough or sneeze. ?Always remember to wash your hands. ? ?Get Help Right Away If: ?You develop worsening fever or sinus pain. ?You develop a severe head ache or visual changes. ?Your symptoms persist after you have completed your treatment plan. ? ?Make sure you ?Understand these instructions. ?Will watch your condition. ?Will get help right away if you are not doing well or get worse. ? ? ?Thank you for choosing an e-visit. ? ?Your e-visit answers were reviewed by a board  certified advanced clinical practitioner to complete your personal care plan. Depending upon the condition, your plan could have included both over the counter or prescription medications. ? ?Please review your pharmacy choice. Make sure the pharmacy is open so you can pick up prescription now. If there is a problem, you may contact your provider through MyChart messaging and have the prescription routed to another pharmacy.  Your safety is important to us. If you have drug allergies check your prescription carefully.  ? ?For the next 24 hours you can use MyChart to ask questions about today's visit, request a non-urgent call back, or ask for a work or school excuse. ?You will get an email in the next two days asking about your experience. I hope that your e-visit has been valuable and will speed your recovery. ? ?

## 2024-03-15 NOTE — Progress Notes (Signed)
 I have spent 5 minutes in review of e-visit questionnaire, review and updating patient chart, medical decision making and response to patient.   Laure Kidney, PA-C

## 2024-03-27 ENCOUNTER — Encounter: Payer: Self-pay | Admitting: Family

## 2024-03-28 ENCOUNTER — Ambulatory Visit: Admitting: Nurse Practitioner

## 2024-03-28 VITALS — BP 104/62 | HR 82 | Temp 98.2°F | Ht 67.0 in | Wt 211.0 lb

## 2024-03-28 DIAGNOSIS — R300 Dysuria: Secondary | ICD-10-CM | POA: Diagnosis not present

## 2024-03-28 LAB — POC URINALSYSI DIPSTICK (AUTOMATED)
Bilirubin, UA: NEGATIVE
Blood, UA: NEGATIVE
Glucose, UA: NEGATIVE
Ketones, UA: NEGATIVE
Leukocytes, UA: NEGATIVE
Nitrite, UA: NEGATIVE
Protein, UA: NEGATIVE
Spec Grav, UA: >=1.03 — AB (ref 1.010–1.025)
Urobilinogen, UA: 0.2 U/dL
pH, UA: 6 (ref 5.0–8.0)

## 2024-03-28 NOTE — Assessment & Plan Note (Signed)
 Symptoms inconsistent with UTI. Recent antibiotics for strep throat.Dipstick negative.  -Pt politely declined vaginal swab. -Urine culture pending. -She will let us  know if symptoms not improving.

## 2024-03-28 NOTE — Progress Notes (Signed)
 Established Patient Office Visit  Subjective:  Patient ID: Mary Richards, female    DOB: 05-05-1986  Age: 38 y.o. MRN: [id number]  CC:  Chief Complaint  Patient presents with   Acute Visit    Burning during urination   Discussed the use of a AI scribe software for clinical note transcription with the patient, who gave verbal consent to proceed.  HPI  Mary Richards is a 38 year old female with a history of kidney stones who presents with intermittent burning sensation during urination.  She experiences a burning sensation during urination intermittently for almost a week, particularly in the mornings, which gradually subsides. There is no associated frequency, urgency, hesitance, or hematuria.  Approximately two and a half weeks ago, she experienced back pain, which has since resolved, and she suspects she may have passed a kidney stone. Her current symptoms are not typical of her past UTIs, which were more persistent and severe. She recently completed a course of Keflex  for strep throat on Mar 25, 2024. She has been increasing her fluid intake    Past Medical History:  Diagnosis Date   Asthma    Chronic migraine    Complication of anesthesia    DDD (degenerative disc disease), lumbosacral    Family history of adverse reaction to anesthesia    Mother and Father - PONV   PONV (postoperative nausea and vomiting)    Ruptured left tubal ectopic pregnancy causing hemoperitoneum 01/23/2021   Wears contact lenses     Past Surgical History:  Procedure Laterality Date   CHOLECYSTECTOMY     COLONOSCOPY WITH PROPOFOL  N/A 02/19/2021   Procedure: COLONOSCOPY WITH PROPOFOL ;  Surgeon: Luke Salaam, MD;  Location: [facility] ENDOSCOPY;  Service: Gastroenterology;  Laterality: N/A;   DIAGNOSTIC LAPAROSCOPY WITH REMOVAL OF ECTOPIC PREGNANCY Left 01/23/2021   Procedure: DIAGNOSTIC LAPAROSCOPY WITH REMOVAL OF ECTOPIC PREGNANCY;  Surgeon: Kris Pester, MD;  Location: [facility] ORS;   Service: Gynecology;  Laterality: Left;   PILONIDAL CYST EXCISION     TONSILECTOMY, ADENOIDECTOMY, BILATERAL MYRINGOTOMY AND TUBES     TYMPANOPLASTY Right 05/10/2019   Procedure: TYMPANOPLASTY;  Surgeon: Lesly Raspberry, MD;  Location: [facility];  Service: ENT;  Laterality: Right;    Family History  Problem Relation Age of Onset   Irritable bowel syndrome Mother    Brain cancer Father 36       Glioblastoma, died one year after dx   Valvular heart disease Sister        Non Cancerous breast mass (both sisters)   COPD Maternal Grandfather    Brain cancer Paternal Grandmother 36       unsure what type, she was not treated   Thyroid  cancer Neg Hx    Colon cancer Neg Hx    Breast cancer Neg Hx     Social History   Socioeconomic History   Marital status: Married    Spouse name: Chayne Winner   Number of children: Not on file   Years of education: Not on file   Highest education level: Some college, no degree  Occupational History   Not on file  Tobacco Use   Smoking status: Never   Smokeless tobacco: Never  Vaping Use   Vaping status: Never Used  Substance and Sexual Activity   Alcohol use: Yes    Alcohol/week: 2.0 standard drinks of alcohol    Types: 2 Standard drinks or equivalent per week    Comment: Social    Drug use: Never  Sexual activity: Yes    Birth control/protection: Surgical    Comment: spouse  Other Topics Concern   Not on file  Social History Narrative   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    2 children   42 year old ( boy)   65 month old ( boy)   Social Drivers of Corporate investment banker Strain: High Risk (03/28/2024)   Overall Financial Resource Strain (CARDIA)    Difficulty of Paying Living Expenses: Hard  Food Insecurity: Food Insecurity Present (03/28/2024)   Hunger Vital Sign    Worried About Running Out of Food in the Last Year: Sometimes true    Ran Out of Food in the Last Year: Often true  Transportation Needs: No  Transportation Needs (03/28/2024)   PRAPARE - Administrator, Civil Service (Medical): No    Lack of Transportation (Non-Medical): No  Physical Activity: Insufficiently Active (03/28/2024)   Exercise Vital Sign    Days of Exercise per Week: 2 days    Minutes of Exercise per Session: 30 min  Stress: Stress Concern Present (03/28/2024)   [employer] of Occupational Health - Occupational Stress Questionnaire    Feeling of Stress : Rather much  Social Connections: Socially Isolated (03/28/2024)   Social Connection and Isolation Panel [NHANES]    Frequency of Communication with Friends and Family: Once a week    Frequency of Social Gatherings with Friends and Family: Never    Attends Religious Services: Never    Database administrator or Organizations: No    Attends Engineer, structural: Not on file    Marital Status: Married  Catering manager Violence: Not on file     Outpatient Medications Prior to Visit  Medication Sig Dispense Refill   albuterol  (PROVENTIL ) (2.5 MG/3ML) 0.083% nebulizer solution Take 3 mLs (2.5 mg total) by nebulization every 6 (six) hours as needed for wheezing or shortness of breath. 150 mL 2   albuterol  (VENTOLIN  HFA) 108 (90 Base) MCG/ACT inhaler INHALE 2 PUFFS INTO THE LUNGS EVERY 6 HOURS AS NEEDED FOR WHEEZING OR SHORTNESS OF BREATH. 6.7 g 1   azelastine  (ASTELIN ) 0.1 % nasal spray Place 1 spray into both nostrils 2 (two) times daily. Use in each nostril as directed 30 mL 3   budesonide -formoterol  (SYMBICORT ) 80-4.5 MCG/ACT inhaler Inhale 2 puffs into the lungs 2 (two) times daily. 10.2 g 3   buPROPion  (WELLBUTRIN  XL) 150 MG 24 hr tablet Take 1 tablet (150 mg total) by mouth every morning. 90 tablet 3   cetirizine (ZYRTEC) 10 MG tablet Take 10 mg by mouth daily.     cholestyramine  (QUESTRAN ) 4 g packet Take 1 packet (4 g total) by mouth 2 (two) times daily. 60 each 11   famotidine  (PEPCID ) 40 MG tablet Take 1 tablet (40 mg total) by mouth 2  (two) times daily. 60 tablet 5   fluticasone  (FLONASE ) 50 MCG/ACT nasal spray Place 2 sprays into both nostrils daily. 16 g 0   hydrOXYzine  (ATARAX ) 25 MG tablet Take 1 tablet (25 mg total) by mouth at bedtime. 30 tablet 0   hydrOXYzine  (ATARAX ) 25 MG tablet Take 1 tablet (25 mg total) by mouth 3 (three) times daily as needed. 90 tablet 3   meloxicam  (MOBIC ) 15 MG tablet Take 1 tablet (15 mg total) by mouth daily as needed for pain. 30 tablet 1   metoCLOPramide  (REGLAN ) 5 MG tablet Take 1 tablet (5 mg total) by mouth every 6 (six)  hours as needed for up to 5 days for nausea. 20 tablet 0   mometasone  (ELOCON ) 0.1 % ointment Apply 1 Application topically sparingly 2 (two) times daily as needed. (Patient not taking: Reported on 02/08/2024) 60 g 3   nortriptyline  (PAMELOR ) 25 MG capsule Take 1 capsule (25 mg total) by mouth at bedtime. (Patient not taking: Reported on 02/08/2024) 90 capsule 1   traZODone  (DESYREL ) 50 MG tablet Take 0.5-1 tablets (25-50 mg total) by mouth at bedtime as needed for sleep. 30 tablet 1   triamcinolone  cream (KENALOG ) 0.1 % Apply 1 Application topically 2 (two) times daily to areas of hives (Patient not taking: Reported on 02/08/2024) 30 g 1   trimethoprim -polymyxin b  (POLYTRIM ) ophthalmic solution Place 1 drop into the left eye every 6 (six) hours for 5 days (Patient not taking: Reported on 02/08/2024) 10 mL 0   No facility-administered medications prior to visit.    Allergies  Allergen Reactions   Azithromycin Hives, Shortness Of Breath and Swelling    Throat swelling   Sulfa Antibiotics Hives   Ciprofloxacin Swelling   Macrobid  [Nitrofurantoin ] Hives    08/2023   Moxifloxacin Hcl Hives   Pamelor  [Nortriptyline  Hcl] Hives    Face and lip swelling, throat starting itchy   Bactrim [Sulfamethoxazole-Trimethoprim ] Rash   Wound Dressing Adhesive Rash    Dermabond    ROS Review of Systems  Genitourinary:  Positive for dysuria. Negative for frequency and urgency.    Negative unless indicated in HPI.    Objective:     Physical Exam Constitutional:      Appearance: Normal appearance.  Cardiovascular:     Rate and Rhythm: Normal rate and regular rhythm.     Pulses: Normal pulses.     Heart sounds: Normal heart sounds.  Abdominal:     General: Bowel sounds are normal.     Palpations: Abdomen is soft.     Tenderness: There is no abdominal tenderness. There is no right CVA tenderness or left CVA tenderness.  Musculoskeletal:     Cervical back: Normal range of motion.  Neurological:     General: No focal deficit present.     Mental Status: She is alert. Mental status is at baseline.  Psychiatric:        Mood and Affect: Mood normal.        Behavior: Behavior normal.        Thought Content: Thought content normal.        Judgment: Judgment normal.     BP 104/62   Pulse 82   Temp 98.2 F (36.8 C)   Ht 5\' 7"  (1.702 m)   Wt 211 lb (95.7 kg)   BMI 33.05 kg/m  Wt Readings from Last 3 Encounters:  03/28/24 211 lb (95.7 kg)  02/08/24 208 lb 4.8 oz (94.5 kg)  12/11/23 211 lb (95.7 kg)     Health Maintenance  Topic Date Due   COVID-19 Vaccine (3 - 2024-25 season) 07/16/2023   Pneumococcal Vaccine 61-78 Years old (1 of 2 - PCV) 12/10/2024 (Originally 07/03/2006)   INFLUENZA VACCINE  06/14/2024   Colonoscopy  02/20/2028   Cervical Cancer Screening (HPV/Pap Cotest)  02/07/2029   DTaP/Tdap/Td (3 - Td or Tdap) 11/12/2031   Hepatitis C Screening  Completed   HPV VACCINES  Aged Out   Meningococcal B Vaccine  Aged Out   HIV Screening  Discontinued    There are no preventive care reminders to display for this patient.  Lab Results  Component Value Date   TSH 2.531 12/12/2023   Lab Results  Component Value Date   WBC 8.6 12/12/2023   HGB 14.1 12/12/2023   HCT 41.6 12/12/2023   MCV 88.1 12/12/2023   PLT 315 12/12/2023   Lab Results  Component Value Date   NA 138 12/12/2023   K 4.1 12/12/2023   CO2 25 12/12/2023   GLUCOSE 90  12/12/2023   BUN 21 (H) 12/12/2023   CREATININE 0.54 12/12/2023   BILITOT 1.1 12/12/2023   ALKPHOS 59 12/12/2023   AST 19 12/12/2023   ALT 15 12/12/2023   PROT 6.5 12/12/2023   ALBUMIN 3.9 12/12/2023   CALCIUM 8.9 12/12/2023   ANIONGAP 6 12/12/2023   GFR 115.69 08/30/2023   Lab Results  Component Value Date   CHOL 156 12/12/2023   Lab Results  Component Value Date   HDL 58 12/12/2023   Lab Results  Component Value Date   LDLCALC 86 12/12/2023   Lab Results  Component Value Date   TRIG 58 12/12/2023   Lab Results  Component Value Date   CHOLHDL 2.7 12/12/2023   Lab Results  Component Value Date   HGBA1C 5.3 08/09/2022      Assessment & Plan:  Dysuria Assessment & Plan: Symptoms inconsistent with UTI. Recent antibiotics for strep throat.Dipstick negative.  -Pt politely declined vaginal swab. -Urine culture pending. -She will let us  know if symptoms not improving.    Orders: -     POCT Urinalysis Dipstick (Automated) -     Urinalysis, Routine w reflex microscopic -     Urine Culture    Follow-up: Return if symptoms worsen or fail to improve.   Lorne Winkels, NP

## 2024-03-29 LAB — URINALYSIS, ROUTINE W REFLEX MICROSCOPIC
Bilirubin Urine: NEGATIVE
Hgb urine dipstick: NEGATIVE
Ketones, ur: NEGATIVE
Leukocytes,Ua: NEGATIVE
Nitrite: NEGATIVE
RBC / HPF: NONE SEEN (ref 0–?)
Specific Gravity, Urine: >=1.03 — AB (ref 1.000–1.030)
Total Protein, Urine: NEGATIVE
Urine Glucose: NEGATIVE
Urobilinogen, UA: 0.2 (ref 0.0–1.0)
WBC, UA: NONE SEEN (ref 0–?)
pH: 6 (ref 5.0–8.0)

## 2024-03-29 LAB — URINE CULTURE
MICRO NUMBER:: 16460507
Result:: NO GROWTH
SPECIMEN QUALITY:: ADEQUATE

## 2024-03-31 ENCOUNTER — Ambulatory Visit: Payer: Self-pay | Admitting: Nurse Practitioner

## 2024-04-08 ENCOUNTER — Encounter: Payer: Self-pay | Admitting: Family

## 2024-04-29 ENCOUNTER — Other Ambulatory Visit: Payer: Self-pay

## 2024-04-29 DIAGNOSIS — S0502XA Injury of conjunctiva and corneal abrasion without foreign body, left eye, initial encounter: Secondary | ICD-10-CM | POA: Diagnosis not present

## 2024-04-29 MED ORDER — POLYMYXIN B-TRIMETHOPRIM 10000-0.1 UNIT/ML-% OP SOLN
1.0000 [drp] | Freq: Four times a day (QID) | OPHTHALMIC | 0 refills | Status: DC
  Filled 2024-04-29: qty 10, 25d supply, fill #0

## 2024-05-02 DIAGNOSIS — M222X2 Patellofemoral disorders, left knee: Secondary | ICD-10-CM | POA: Diagnosis not present

## 2024-05-02 DIAGNOSIS — M222X1 Patellofemoral disorders, right knee: Secondary | ICD-10-CM | POA: Diagnosis not present

## 2024-05-09 ENCOUNTER — Ambulatory Visit: Admitting: Dermatology

## 2024-05-13 ENCOUNTER — Encounter: Payer: Self-pay | Admitting: Dermatology

## 2024-05-13 ENCOUNTER — Ambulatory Visit: Admitting: Dermatology

## 2024-05-13 DIAGNOSIS — L578 Other skin changes due to chronic exposure to nonionizing radiation: Secondary | ICD-10-CM | POA: Diagnosis not present

## 2024-05-13 DIAGNOSIS — W908XXA Exposure to other nonionizing radiation, initial encounter: Secondary | ICD-10-CM

## 2024-05-13 DIAGNOSIS — Z1283 Encounter for screening for malignant neoplasm of skin: Secondary | ICD-10-CM

## 2024-05-13 DIAGNOSIS — L811 Chloasma: Secondary | ICD-10-CM | POA: Diagnosis not present

## 2024-05-13 DIAGNOSIS — J300 Vasomotor rhinitis: Secondary | ICD-10-CM | POA: Insufficient documentation

## 2024-05-13 DIAGNOSIS — D229 Melanocytic nevi, unspecified: Secondary | ICD-10-CM

## 2024-05-13 DIAGNOSIS — D492 Neoplasm of unspecified behavior of bone, soft tissue, and skin: Secondary | ICD-10-CM | POA: Diagnosis not present

## 2024-05-13 DIAGNOSIS — D225 Melanocytic nevi of trunk: Secondary | ICD-10-CM | POA: Diagnosis not present

## 2024-05-13 DIAGNOSIS — J309 Allergic rhinitis, unspecified: Secondary | ICD-10-CM | POA: Insufficient documentation

## 2024-05-13 DIAGNOSIS — H1045 Other chronic allergic conjunctivitis: Secondary | ICD-10-CM | POA: Insufficient documentation

## 2024-05-13 DIAGNOSIS — D239 Other benign neoplasm of skin, unspecified: Secondary | ICD-10-CM

## 2024-05-13 DIAGNOSIS — L821 Other seborrheic keratosis: Secondary | ICD-10-CM

## 2024-05-13 DIAGNOSIS — D1801 Hemangioma of skin and subcutaneous tissue: Secondary | ICD-10-CM | POA: Diagnosis not present

## 2024-05-13 DIAGNOSIS — L814 Other melanin hyperpigmentation: Secondary | ICD-10-CM | POA: Diagnosis not present

## 2024-05-13 DIAGNOSIS — D485 Neoplasm of uncertain behavior of skin: Secondary | ICD-10-CM

## 2024-05-13 HISTORY — DX: Other benign neoplasm of skin, unspecified: D23.9

## 2024-05-13 MED ORDER — AMBULATORY NON FORMULARY MEDICATION: 2 refills | Status: AC

## 2024-05-13 NOTE — Progress Notes (Signed)
 New Patient Visit   Subjective  Mary Richards is a 38 y.o. female who presents for the following: Skin Cancer Screening and Full Body Skin Exam  Patient with melasma. Has used SM hydroquinone with improvement. No personal or fhx skin cancer.   The patient presents for Total-Body Skin Exam (TBSE) for skin cancer screening and mole check. The patient has spots, moles and lesions to be evaluated, some may be new or changing and the patient may have concern these could be cancer.    The following portions of the chart were reviewed this encounter and updated as appropriate: medications, allergies, medical history  Review of Systems:  No other skin or systemic complaints except as noted in HPI or Assessment and Plan.  Objective  Well appearing patient in no apparent distress; mood and affect are within normal limits.  A full examination was performed including scalp, head, eyes, ears, nose, lips, neck, chest, axillae, abdomen, back, buttocks, bilateral upper extremities, bilateral lower extremities, hands, feet, fingers, toes, fingernails, and toenails. All findings within normal limits unless otherwise noted below.   Exam of nails limited by presence of nail polish.  Relevant physical exam findings are noted in the Assessment and Plan.  Right Lower Back 1.4 cm pigmented red to brown patch   Assessment & Plan   SKIN CANCER SCREENING PERFORMED TODAY.  ACTINIC DAMAGE - Chronic condition, secondary to cumulative UV/sun exposure - diffuse scaly erythematous macules with underlying dyspigmentation - Recommend daily broad spectrum sunscreen SPF 30+ to sun-exposed areas, reapply every 2 hours as needed.  - Staying in the shade or wearing long sleeves, sun glasses (UVA+UVB protection) and wide brim hats (4-inch brim around the entire circumference of the hat) are also recommended for sun protection.  - Call for new or changing lesions.  LENTIGINES, SEBORRHEIC KERATOSES,  HEMANGIOMAS - Benign normal skin lesions - Benign-appearing - Call for any changes  MELANOCYTIC NEVI - Tan-brown and/or pink-flesh-colored symmetric macules and papules - Benign appearing on exam today - Observation - Call clinic for new or changing moles - Recommend daily use of broad spectrum spf 30+ sunscreen to sun-exposed areas.   MELASMA Exam: reticulated hyperpigmented patches at bilateral lateral forehead  Chronic and persistent condition with duration or expected duration over one year. Condition is symptomatic/ bothersome to patient. Not currently at goal.  Melasma is a chronic; persistent condition of hyperpigmented patches generally on the face, worse in summer due to higher UV exposure.    Heredity; thyroid  disease; sun exposure; pregnancy; birth control pills; epilepsy medication and darker skin may predispose to Melasma.   Recommendations include: - Sun avoidance and daily broad spectrum (UVA/UVB) tinted mineral sunscreen SPF 30+, with Zinc or Titanium Dioxide. - Rx topical bleaching creams (i.e. hydroquinone) is a common treatment but should not be used long term.  Hydroquinones may be mixed with retinoids; vitamin C; steroids; Kojic Acid. - Alastin A-luminate, retinoids, vitamin C, topical tranexamic acid, glycolic acid and kojic acid can be used for brightening while on break from hydroquinone - Rx Azelaic Acid is also a treatment option that is safe for pregnancy (Category B). - OTC Heliocare can be helpful in control and prevention. - Oral Rx with Tranexamic Acid 250 mg - 650 mg po daily can be used for moderate to severe cases especially during summer (contraindications include pregnancy; lactation; hx of PE; hx of DVT; clotting disorder; heart disease; anticoagulant use and upcoming long trips)   - Chemical peels (would need multiple for  best result).  - Lasers and  Microdermabrasion may also be helpful adjunct treatments.  Treatment Plan: Will send in [employer] cream with Hydroquinone: 12% Kojic Acid: 6% Niacinamide: 2% Vitamin C: 1%.  Apply a thin layer to affected areas twice daily for 3 months then stop. #30g 2RF  NEOPLASM OF UNCERTAIN BEHAVIOR OF SKIN Right Lower Back Epidermal / dermal shaving  Lesion diameter (cm):  1.4 Informed consent: discussed and consent obtained   Timeout: patient name, date of birth, surgical site, and procedure verified   Procedure prep:  Patient was prepped and draped in usual sterile fashion Prep type:  Isopropyl alcohol Anesthesia: the lesion was anesthetized in a standard fashion   Anesthetic:  1% lidocaine  w/ epinephrine  1-100,000 buffered w/ 8.4% NaHCO3 Instrument used: DermaBlade   Hemostasis achieved with: pressure and aluminum chloride   Outcome: patient tolerated procedure well   Post-procedure details: wound care instructions given   Specimen 1 - Surgical pathology Differential Diagnosis: dysplastic nevus vs melanoma  Check Margins: No  Return in about 1 year (around 05/13/2025) for TBSE, with Dr. Claudene.  LILLETTE Lonell Drones, RMA, am acting as scribe for Boneta Claudene, MD .   Documentation: I have reviewed the above documentation for accuracy and completeness, and I agree with the above.  Boneta Claudene, MD

## 2024-05-13 NOTE — Patient Instructions (Signed)
 Instructions for [facility] Medications  One or more of your medications was sent to the [facility] mail order compounding pharmacy. You will receive an email from them and can purchase the medicine through that link. It will then be mailed to your home at the address you confirmed. If for any reason you do not receive an email from them, please check your spam folder. If you still do not find the email, please let us  know. [facility] phone number is [phone].  Wound Care Instructions  Cleanse wound gently with soap and water once a day then pat dry with clean gauze. Apply a thin coat of Petrolatum (petroleum jelly, Vaseline) over the wound (unless you have an allergy to this). We recommend that you use a new, sterile tube of Vaseline. Do not pick or remove scabs. Do not remove the yellow or white healing tissue from the base of the wound.  Cover the wound with fresh, clean, nonstick gauze and secure with paper tape. You may use Band-Aids in place of gauze and tape if the wound is small enough, but would recommend trimming much of the tape off as there is often too much. Sometimes Band-Aids can irritate the skin.  You should call the office for your biopsy report after 1 week if you have not already been contacted.  If you experience any problems, such as abnormal amounts of bleeding, swelling, significant bruising, significant pain, or evidence of infection, please call the office immediately.  FOR ADULT SURGERY PATIENTS: If you need something for pain relief you may take 1 extra strength Tylenol  (acetaminophen ) AND 2 Ibuprofen  (200mg  each) together every 4 hours as needed for pain. (do not take these if you are allergic to them or if you have a reason you should not take them.) Typically, you may only need pain medication for 1 to 3 days.   Melanoma ABCDEs  Melanoma is the most dangerous type of skin cancer, and is the leading cause of death from skin disease.  You are more  likely to develop melanoma if you: Have light-colored skin, light-colored eyes, or red or blond hair Spend a lot of time in the sun Tan regularly, either outdoors or in a tanning bed Have had blistering sunburns, especially during childhood Have a close family member who has had a melanoma Have atypical moles or large birthmarks  Early detection of melanoma is key since treatment is typically straightforward and cure rates are extremely high if we catch it early.   The first sign of melanoma is often a change in a mole or a new dark spot.  The ABCDE system is a way of remembering the signs of melanoma.  A for asymmetry:  The two halves do not match. B for border:  The edges of the growth are irregular. C for color:  A mixture of colors are present instead of an even brown color. D for diameter:  Melanomas are usually (but not always) greater than 6mm - the size of a pencil eraser. E for evolution:  The spot keeps changing in size, shape, and color.  Please check your skin once per month between visits. You can use a small mirror in front and a large mirror behind you to keep an eye on the back side or your body.   If you see any new or changing lesions before your next follow-up, please call to schedule a visit.  Please continue daily skin protection including broad spectrum sunscreen SPF 30+ to sun-exposed  areas, reapplying every 2 hours as needed when you're outdoors.    Due to recent changes in healthcare laws, you may see results of your pathology and/or laboratory studies on MyChart before the doctors have had a chance to review them. We understand that in some cases there may be results that are confusing or concerning to you. Please understand that not all results are received at the same time and often the doctors may need to interpret multiple results in order to provide you with the best plan of care or course of treatment. Therefore, we ask that you please give us  2 business days  to thoroughly review all your results before contacting the office for clarification. Should we see a critical lab result, you will be contacted sooner.   If You Need Anything After Your Visit  If you have any questions or concerns for your doctor, please call our main line at [phone] and press option 4 to reach your doctor's medical assistant. If no one answers, please leave a voicemail as directed and we will return your call as soon as possible. Messages left after 4 pm will be answered the following business day.   You may also send us  a message via MyChart. We typically respond to MyChart messages within 1-2 business days.  For prescription refills, please ask your pharmacy to contact our office. Our fax number is [phone].  If you have an urgent issue when the clinic is closed that cannot wait until the next business day, you can page your doctor at the number below.    Please note that while we do our best to be available for urgent issues outside of office hours, we are not available 24/7.   If you have an urgent issue and are unable to reach us , you may choose to seek medical care at your doctor's office, retail clinic, urgent care center, or emergency room.  If you have a medical emergency, please immediately call 911 or go to the emergency department.  Pager Numbers  - Dr. Hester: [phone]  - Dr. Jackquline: [phone]  - Dr. Claudene: [phone]   In the event of inclement weather, please call our main line at [phone] for an update on the status of any delays or closures.  Dermatology Medication Tips: Please keep the boxes that topical medications come in in order to help keep track of the instructions about where and how to use these. Pharmacies typically print the medication instructions only on the boxes and not directly on the medication tubes.   If your medication is too expensive, please contact our office at [phone] option 4 or send us  a  message through MyChart.   We are unable to tell what your co-pay for medications will be in advance as this is different depending on your insurance coverage. However, we may be able to find a substitute medication at lower cost or fill out paperwork to get insurance to cover a needed medication.   If a prior authorization is required to get your medication covered by your insurance company, please allow us  1-2 business days to complete this process.  Drug prices often vary depending on where the prescription is filled and some pharmacies may offer cheaper prices.  The website www.goodrx.com contains coupons for medications through different pharmacies. The prices here do not account for what the cost may be with help from insurance (it may be cheaper with your insurance), but the website can give you the price if you did not  use any insurance.  - You can print the associated coupon and take it with your prescription to the pharmacy.  - You may also stop by our office during regular business hours and pick up a GoodRx coupon card.  - If you need your prescription sent electronically to a different pharmacy, notify our office through [facility] or by phone at [phone] option 4.     Si Usted Necesita Algo Despus de Su Visita  Tambin puede enviarnos un mensaje a travs de Clinical cytogeneticist. Por lo general respondemos a los mensajes de MyChart en el transcurso de 1 a 2 das hbiles.  Para renovar recetas, por favor pida a su farmacia que se ponga en contacto con nuestra oficina. Randi lakes de fax es [place] [phone].  Si tiene un asunto urgente cuando la clnica est cerrada y que no puede esperar hasta el siguiente da hbil, puede llamar/localizar a su doctor(a) al nmero que aparece a continuacin.   Por favor, tenga en cuenta que aunque hacemos todo lo posible para estar disponibles para asuntos urgentes fuera del horario de [place], no estamos disponibles las 24 horas del da, los 7  [address]  [address] de la [place].   Si tiene un problema urgente y no puede comunicarse con nosotros, puede optar por buscar atencin mdica  en el consultorio de su doctor(a), en una clnica privada, en un centro de atencin urgente o en una sala de emergencias.  Si tiene Engineer, drilling, por favor llame inmediatamente al 911 o vaya a la sala de emergencias.  Nmeros de bper  - Dr. Hester: [phone]  - Dra. Jackquline: [phone]  - Dr. Claudene: [phone]   En caso de inclemencias del tiempo, por favor llame a landry capes principal al [phone] para una actualizacin sobre el [place] de cualquier retraso o cierre.  Consejos para la medicacin en dermatologa: Por favor, guarde las cajas en las que vienen los medicamentos de uso tpico para ayudarle a seguir las instrucciones sobre dnde y cmo usarlos. Las farmacias generalmente imprimen las instrucciones del medicamento slo en las cajas y no directamente en los tubos del [place].   Si su medicamento es muy caro, por favor, pngase en contacto con landry rieger llamando al [phone] y presione la opcin 4 o envenos un mensaje a travs de Clinical cytogeneticist.   No podemos decirle cul ser su copago por los medicamentos por adelantado ya que esto es diferente dependiendo de la cobertura de su seguro. Sin embargo, es posible que podamos encontrar un medicamento sustituto a Audiological scientist un formulario para que el seguro cubra el medicamento que se considera necesario.   Si se requiere una autorizacin previa para que su compaa de seguros malta su medicamento, por favor permtanos de 1 a 2 das hbiles para completar este proceso.  Los precios de los medicamentos varan con frecuencia dependiendo del Environmental consultant de dnde se surte la receta y alguna farmacias pueden ofrecer precios ms baratos.  El sitio web www.goodrx.com tiene cupones para medicamentos de Health and safety inspector. Los precios aqu no tienen en cuenta lo que podra costar con  la ayuda del seguro (puede ser ms barato con su seguro), pero el sitio web puede darle el precio si no utiliz Tourist information centre manager.  - Puede imprimir el cupn correspondiente y llevarlo con su receta a la farmacia.  - Tambin puede pasar por nuestra oficina durante el horario de atencin regular y Education officer, museum una tarjeta de cupones de GoodRx.  - Si necesita que su receta se enve  electrnicamente a ignacia bender diferente, informe a nuestra oficina a travs de MyChart de  o por telfono llamando al [phone] y presione la opcin 4.

## 2024-05-15 ENCOUNTER — Other Ambulatory Visit: Payer: Self-pay

## 2024-05-15 ENCOUNTER — Ambulatory Visit: Payer: Self-pay | Admitting: Dermatology

## 2024-05-15 LAB — SURGICAL PATHOLOGY

## 2024-05-16 ENCOUNTER — Encounter: Payer: Self-pay | Admitting: Dermatology

## 2024-05-16 NOTE — Telephone Encounter (Signed)
 Advised pt of bx results.  Discussed shave removal.  Scheduled pt for 06/04/24./sh

## 2024-05-16 NOTE — Telephone Encounter (Signed)
-----   Message from [place] sent at 05/15/2024  7:57 PM EDT ----- Diagnosis: right lower back :       DYSPLASTIC NEVUS WITH MODERATE TO SEVERE ATYPIA, PERIPHERAL AND DEEP MARGINS       INVOLVED, SEE DESCRIPTION   Plan: please call with diagnosis and schedule shave removal  The biopsy from your lower back shows an atypical mole (dysplastic nevus). It is partly severely abnormal which can be difficult to distinguish from melanoma skin cancer, so we should ensure it has  been fully removed. I recommend that you return for an appointment where we remove another thin layer of skin where the mole was to check for any remaining abnormal mole. ----- Message ----- From: Interface, Lab In [place] Sent: 05/15/2024   3:44 PM EDT To: Boneta Sharps, MD

## 2024-05-21 ENCOUNTER — Telehealth: Payer: Self-pay

## 2024-05-21 NOTE — Telephone Encounter (Signed)
 Mary Richards sent in [facility] RX during office visit and patient has called their facility but its not going through [facility]. Can you log into your portal and get the RX sent in for patient?  Thank you!

## 2024-05-23 NOTE — Telephone Encounter (Signed)
 Thank you :)

## 2024-06-04 ENCOUNTER — Ambulatory Visit: Admitting: Dermatology

## 2024-06-04 DIAGNOSIS — D239 Other benign neoplasm of skin, unspecified: Secondary | ICD-10-CM

## 2024-06-04 DIAGNOSIS — D235 Other benign neoplasm of skin of trunk: Secondary | ICD-10-CM | POA: Diagnosis not present

## 2024-06-04 DIAGNOSIS — D225 Melanocytic nevi of trunk: Secondary | ICD-10-CM | POA: Diagnosis not present

## 2024-06-04 NOTE — Patient Instructions (Signed)

## 2024-06-04 NOTE — Progress Notes (Unsigned)
   Follow-Up Visit   Subjective  Mary Richards is a 38 y.o. female who presents for the following: Bx proven moderate to severely dysplastic nevus on the R lower back, pt here for shave removal.  The following portions of the chart were reviewed this encounter and updated as appropriate: medications, allergies, medical history  Review of Systems:  No other skin or systemic complaints except as noted in HPI or Assessment and Plan.  Objective  Well appearing patient in no apparent distress; mood and affect are within normal limits.   A focused examination was performed of the following areas: the back   Relevant exam findings are noted in the Assessment and Plan.  Right Lower Back Healing biopsy site.  Assessment & Plan  DYSPLASTIC NEVUS Right Lower Back Epidermal / dermal shaving - Right Lower Back  Lesion diameter (cm):  1.4 Informed consent: discussed and consent obtained   Timeout: patient name, date of birth, surgical site, and procedure verified   Procedure prep:  Patient was prepped and draped in usual sterile fashion Prep type:  Isopropyl alcohol Anesthesia: the lesion was anesthetized in a standard fashion   Anesthetic:  1% lidocaine  w/ epinephrine  1-100,000 buffered w/ 8.4% NaHCO3 Instrument used: flexible razor blade   Hemostasis achieved with: pressure, aluminum chloride and electrodesiccation   Outcome: patient tolerated procedure well   Post-procedure details: sterile dressing applied and wound care instructions given   Dressing type: bandage (Mupirocin 2% ointment)    Specimen 1 - Surgical pathology Differential Diagnosis: D48.5 bx proven moderate to severely dysplastic nevus [id number] Check Margins: Yes  Return for appointment as scheduled for TBSE with Dr. Claudene.  Mary Richards, CMA, am acting as scribe for Boneta Claudene, MD .   Documentation: I have reviewed the above documentation for accuracy and completeness, and I agree with the  above.  Boneta Claudene, MD

## 2024-06-05 ENCOUNTER — Encounter: Payer: Self-pay | Admitting: Dermatology

## 2024-06-07 ENCOUNTER — Ambulatory Visit: Payer: Self-pay | Admitting: Dermatology

## 2024-06-07 LAB — SURGICAL PATHOLOGY

## 2024-06-14 DIAGNOSIS — M25512 Pain in left shoulder: Secondary | ICD-10-CM | POA: Diagnosis not present

## 2024-07-07 ENCOUNTER — Encounter: Payer: Self-pay | Admitting: Licensed Practical Nurse

## 2024-07-09 ENCOUNTER — Telehealth: Admitting: Physician Assistant

## 2024-07-09 ENCOUNTER — Other Ambulatory Visit: Payer: Self-pay

## 2024-07-09 DIAGNOSIS — J329 Chronic sinusitis, unspecified: Secondary | ICD-10-CM

## 2024-07-09 MED ORDER — FLUTICASONE PROPIONATE 50 MCG/ACT NA SUSP
2.0000 | Freq: Every day | NASAL | 0 refills | Status: AC
  Filled 2024-07-09 – 2024-07-16 (×2): qty 16, 30d supply, fill #0

## 2024-07-09 MED ORDER — DOXYCYCLINE HYCLATE 100 MG PO CAPS
100.0000 mg | ORAL_CAPSULE | Freq: Two times a day (BID) | ORAL | 0 refills | Status: AC
  Filled 2024-07-09: qty 14, 7d supply, fill #0

## 2024-07-09 NOTE — Progress Notes (Signed)
 E-Visit for Sinus Problems  We are sorry that you are not feeling well.  Here is how we plan to help!  Based on what you have shared with me it looks like you have sinusitis.  Sinusitis is inflammation and infection in the sinus cavities of the head.  Based on your presentation I believe you most likely have Acute Bacterial Sinusitis.  This is an infection caused by bacteria and is treated with antibiotics. I have prescribed Doxycycline  100mg  by mouth twice a day for 7 days and fluticasone  nasal spray. You may use an oral decongestant such as Mucinex D or if you have glaucoma or high blood pressure use plain Mucinex. Saline nasal spray help and can safely be used as often as needed for congestion.  If you develop worsening sinus pain, fever or notice severe headache and vision changes, or if symptoms are not better after completion of antibiotic, please schedule an appointment with a health care provider.    Sinus infections are not as easily transmitted as other respiratory infection, however we still recommend that you avoid close contact with loved ones, especially the very young and elderly.  Remember to wash your hands thoroughly throughout the day as this is the number one way to prevent the spread of infection!  Home Care: Only take medications as instructed by your medical team. Complete the entire course of an antibiotic. Do not take these medications with alcohol. A steam or ultrasonic humidifier can help congestion.  You can place a towel over your head and breathe in the steam from hot water coming from a faucet. Avoid close contacts especially the very young and the elderly. Cover your mouth when you cough or sneeze. Always remember to wash your hands.  Get Help Right Away If: You develop worsening fever or sinus pain. You develop a severe head ache or visual changes. Your symptoms persist after you have completed your treatment plan.  Make sure you Understand these  instructions. Will watch your condition. Will get help right away if you are not doing well or get worse.  Thank you for choosing an e-visit.  Your e-visit answers were reviewed by a board certified advanced clinical practitioner to complete your personal care plan. Depending upon the condition, your plan could have included both over the counter or prescription medications.  Please review your pharmacy choice. Make sure the pharmacy is open so you can pick up prescription now. If there is a problem, you may contact your provider through [employer] and have the prescription routed to another pharmacy.  Your safety is important to us . If you have drug allergies check your prescription carefully.   For the next 24 hours you can use MyChart to ask questions about today's visit, request a non-urgent call back, or ask for a work or school excuse. You will get an email in the next two days asking about your experience. I hope that your e-visit has been valuable and will speed your recovery.  Approximately 5 minutes was spent documenting and reviewing patient's chart.

## 2024-07-16 ENCOUNTER — Other Ambulatory Visit: Payer: Self-pay

## 2024-07-16 ENCOUNTER — Other Ambulatory Visit: Payer: Self-pay | Admitting: Family

## 2024-07-16 DIAGNOSIS — M545 Low back pain, unspecified: Secondary | ICD-10-CM

## 2024-07-16 MED ORDER — MELOXICAM 15 MG PO TABS
15.0000 mg | ORAL_TABLET | Freq: Every day | ORAL | 1 refills | Status: AC | PRN
  Filled 2024-07-24: qty 30, 30d supply, fill #0

## 2024-07-17 ENCOUNTER — Ambulatory Visit (INDEPENDENT_AMBULATORY_CARE_PROVIDER_SITE_OTHER): Admitting: Licensed Practical Nurse

## 2024-07-17 ENCOUNTER — Encounter: Payer: Self-pay | Admitting: Licensed Practical Nurse

## 2024-07-17 VITALS — BP 139/81 | HR 81 | Ht 67.0 in | Wt 213.3 lb

## 2024-07-17 DIAGNOSIS — N921 Excessive and frequent menstruation with irregular cycle: Secondary | ICD-10-CM

## 2024-07-17 NOTE — Progress Notes (Signed)
 Dineen Rollene MATSU, FNP   No chief complaint on file.   HPI:      Mary Richards is a 38 y.o. [id number] whose LMP was Patient's last menstrual period was 06/23/2024 (exact date)., presents today for abnormal bleeding. Mary Richards has had irregular bleeding for the last 2 cycles.  Normally her cycles are monthly, last 6 days where the first 2 days are light, days 3-4 are heavier and then by day 6 she is done She had a cycle 7/27 through 8/4 and then 8/10 through 8/24, at first the flow was light then became heavy-needing to empty her menstrual cup 3 times per day. Then the bleeding lingered for more days than usual. Typically she does not have cramping but does get back pain with her cycles that tend to resolve with Motrin  and heat.  She was feeling more exhausted during the bleeding, declines CBC today as she feels like  she is better.   She was seen on 3/27 for her annual where she reported  The patient reports her cycles have been weird for the last 2 years they can be a little early or late and at times she will spot a few days before the start of the cycle, or the cycle with linger with light bleeding.   She has never been evaluated for irregular bleeding.  Her mother had a hysterectomy in her late 14's d/t heavy bleeding form fibroids  When asked about new medications, Eduardo reports she was started on Wellbutrin  but has since stopped. She is not on any hormones. She is sexually active with 1 female partner, they use vasectomy for contraception.   Denies hx CHTN, tobacco use  May have Migraines aura    Patient Active Problem List   Diagnosis Date Noted   Melasma 05/13/2024   Chronic allergic conjunctivitis 05/13/2024   Allergic rhinitis 05/13/2024   Vasomotor rhinitis 05/13/2024   Right knee pain 12/15/2023   Syncope and collapse 08/25/2023   Vertigo 06/30/2023   Sinusitis 06/30/2023   Respiratory illness 04/13/2023   Pneumonia 03/06/2023   Persistent cough  02/16/2023   Urticaria 02/08/2023   Back pain 01/27/2023   Intractable migraine 09/02/2022   Dysuria 07/28/2022   URI with cough and congestion 05/23/2022   History of urinary stone 04/24/2022   Irritable bowel syndrome without diarrhea 04/24/2022   Irritant contact dermatitis, unspecified cause 04/24/2022   Asthma during pregnancy 05/28/2021   Routine physical examination 01/11/2021   Bilateral impacted cerumen 06/05/2020   Obesity (BMI 30-39.9) 04/29/2020   GAD (generalized anxiety disorder) 07/08/2019   Chronic low back pain 07/08/2019   Pilonidal cyst 02/02/2017    Past Surgical History:  Procedure Laterality Date   CHOLECYSTECTOMY     COLONOSCOPY WITH PROPOFOL  N/A 02/19/2021   Procedure: COLONOSCOPY WITH PROPOFOL ;  Surgeon: Therisa Bi, MD;  Location: [facility] ENDOSCOPY;  Service: Gastroenterology;  Laterality: N/A;   DIAGNOSTIC LAPAROSCOPY WITH REMOVAL OF ECTOPIC PREGNANCY Left 01/23/2021   Procedure: DIAGNOSTIC LAPAROSCOPY WITH REMOVAL OF ECTOPIC PREGNANCY;  Surgeon: Leonce Garnette BIRCH, MD;  Location: [facility] ORS;  Service: Gynecology;  Laterality: Left;   PILONIDAL CYST EXCISION     TONSILECTOMY, ADENOIDECTOMY, BILATERAL MYRINGOTOMY AND TUBES     TYMPANOPLASTY Right 05/10/2019   Procedure: TYMPANOPLASTY;  Surgeon: Herminio Miu, MD;  Location: [facility];  Service: ENT;  Laterality: Right;    Family History  Problem Relation Age of Onset   Irritable bowel syndrome Mother    Brain cancer Father 82  Glioblastoma, died one year after dx   Valvular heart disease Sister        Non Cancerous breast mass (both sisters)   COPD Maternal Grandfather    Brain cancer Paternal Grandmother 56       unsure what type, she was not treated   Thyroid  cancer Neg Hx    Colon cancer Neg Hx    Breast cancer Neg Hx     Social History   Socioeconomic History   Marital status: Married    Spouse name: Chayne Frye   Number of children: Not on file   Years of education: Not on  file   Highest education level: Some college, no degree  Occupational History   Not on file  Tobacco Use   Smoking status: Never   Smokeless tobacco: Never  Vaping Use   Vaping status: Never Used  Substance and Sexual Activity   Alcohol use: Yes    Alcohol/week: 2.0 standard drinks of alcohol    Types: 2 Standard drinks or equivalent per week    Comment: Social    Drug use: Never   Sexual activity: Yes    Birth control/protection: Surgical    Comment: spouse  Other Topics Concern   Not on file  Social History Narrative   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    2 children   72 year old ( boy)   84 month old ( boy)   Social Drivers of Corporate investment banker Strain: High Risk (03/28/2024)   Overall Financial Resource Strain (CARDIA)    Difficulty of Paying Living Expenses: Hard  Food Insecurity: Food Insecurity Present (03/28/2024)   Hunger Vital Sign    Worried About Running Out of Food in the Last Year: Sometimes true    Ran Out of Food in the Last Year: Often true  Transportation Needs: No Transportation Needs (03/28/2024)   PRAPARE - Administrator, Civil Service (Medical): No    Lack of Transportation (Non-Medical): No  Physical Activity: Insufficiently Active (03/28/2024)   Exercise Vital Sign    Days of Exercise per Week: 2 days    Minutes of Exercise per Session: 30 min  Stress: Stress Concern Present (03/28/2024)   [employer] of Occupational Health - Occupational Stress Questionnaire    Feeling of Stress : Rather much  Social Connections: Socially Isolated (03/28/2024)   Social Connection and Isolation Panel    Frequency of Communication with Friends and Family: Once a week    Frequency of Social Gatherings with Friends and Family: Never    Attends Religious Services: Never    Database administrator or Organizations: No    Attends Engineer, structural: Not on file    Marital Status: Married  Catering manager Violence: Not on file     Outpatient Medications Prior to Visit  Medication Sig Dispense Refill   albuterol  (PROVENTIL ) (2.5 MG/3ML) 0.083% nebulizer solution Take 3 mLs (2.5 mg total) by nebulization every 6 (six) hours as needed for wheezing or shortness of breath. 150 mL 2   albuterol  (VENTOLIN  HFA) 108 (90 Base) MCG/ACT inhaler INHALE 2 PUFFS INTO THE LUNGS EVERY 6 HOURS AS NEEDED FOR WHEEZING OR SHORTNESS OF BREATH. 6.7 g 1   AMBULATORY NON FORMULARY MEDICATION Apply twice daily to aa face x 3 months. 30 g 2   azelastine  (ASTELIN ) 0.1 % nasal spray Place 1 spray into both nostrils 2 (two) times daily. Use in each nostril as directed 30  mL 3   budesonide -formoterol  (SYMBICORT ) 80-4.5 MCG/ACT inhaler Inhale 2 puffs into the lungs 2 (two) times daily. 10.2 g 3   buPROPion  (WELLBUTRIN  XL) 150 MG 24 hr tablet Take 1 tablet (150 mg total) by mouth every morning. 90 tablet 3   cetirizine (ZYRTEC) 10 MG tablet Take 10 mg by mouth daily.     cholestyramine  (QUESTRAN ) 4 g packet Take 1 packet (4 g total) by mouth 2 (two) times daily. 60 each 11   famotidine  (PEPCID ) 40 MG tablet Take 1 tablet (40 mg total) by mouth 2 (two) times daily. 60 tablet 5   fluticasone  (FLONASE ) 50 MCG/ACT nasal spray Place 2 sprays into both nostrils daily. 16 g 0   fluticasone  (FLONASE ) 50 MCG/ACT nasal spray Place 2 sprays into both nostrils daily. 16 g 0   hydrOXYzine  (ATARAX ) 25 MG tablet Take 1 tablet (25 mg total) by mouth at bedtime. 30 tablet 0   hydrOXYzine  (ATARAX ) 25 MG tablet Take 1 tablet (25 mg total) by mouth 3 (three) times daily as needed. 90 tablet 3   meloxicam  (MOBIC ) 15 MG tablet Take 1 tablet (15 mg total) by mouth daily as needed for pain. 30 tablet 1   metoCLOPramide  (REGLAN ) 5 MG tablet Take 1 tablet (5 mg total) by mouth every 6 (six) hours as needed for up to 5 days for nausea. 20 tablet 0   mometasone  (ELOCON ) 0.1 % ointment Apply 1 Application topically sparingly 2 (two) times daily as needed. (Patient not taking:  Reported on 02/08/2024) 60 g 3   nortriptyline  (PAMELOR ) 25 MG capsule Take 1 capsule (25 mg total) by mouth at bedtime. (Patient not taking: Reported on 02/08/2024) 90 capsule 1   traZODone  (DESYREL ) 50 MG tablet Take 0.5-1 tablets (25-50 mg total) by mouth at bedtime as needed for sleep. 30 tablet 1   triamcinolone  cream (KENALOG ) 0.1 % Apply 1 Application topically 2 (two) times daily to areas of hives (Patient not taking: Reported on 02/08/2024) 30 g 1   trimethoprim -polymyxin b  (POLYTRIM ) ophthalmic solution Place 1 drop into the left eye every 6 (six) hours for 5 days (Patient not taking: Reported on 02/08/2024) 10 mL 0   trimethoprim -polymyxin b  (POLYTRIM ) ophthalmic solution Place 1 drop into the affected eye(s) 4 (four) times daily. 10 mL 0   No facility-administered medications prior to visit.      ROS:  Review of Systems see HPI    OBJECTIVE:   Vitals:  BP 139/81 (BP Location: Right Arm, Patient Position: Sitting, Cuff Size: Normal)   Pulse 81   Ht 5' 7 (1.702 m)   Wt 213 lb 4.8 oz (96.8 kg)   LMP 06/23/2024 (Exact Date)   BMI 33.41 kg/m   Physical Exam Constitutional:      Appearance: Normal appearance.  Cardiovascular:     Rate and Rhythm: Normal rate.  Pulmonary:     Effort: Pulmonary effort is normal.  Genitourinary:    Comments: Declines exam  Musculoskeletal:        General: Normal range of motion.  Neurological:     Mental Status: She is alert.  Psychiatric:        Mood and Affect: Mood normal.        Thought Content: Thought content normal.     Results: No results found for this or any previous visit (from the past 24 hours).   Assessment/Plan: Menorrhagia with irregular cycle - Plan: US  PELVIS TRANSVAGINAL NON-OB (TV ONLY)  Discussed irregular bleeding could be  related to fibroids or potential perimenopausal changes. PCOS is less likely.  Discussed management with hormones, other treatment options may be recommended based on the pelvic US .    Will have Pelvic US , if normal then will proceed with Mirena IUD.   No orders of the defined types were placed in this encounter.    JINNIE HERO [facility], CNM 07/17/2024 4:38 PM

## 2024-07-18 ENCOUNTER — Ambulatory Visit
Admission: RE | Admit: 2024-07-18 | Discharge: 2024-07-18 | Disposition: A | Discharge status: Home / Self Care | Source: Ambulatory Visit | Attending: Licensed Practical Nurse | Admitting: Licensed Practical Nurse

## 2024-07-18 DIAGNOSIS — N921 Excessive and frequent menstruation with irregular cycle: Secondary | ICD-10-CM

## 2024-07-18 DIAGNOSIS — N83291 Other ovarian cyst, right side: Secondary | ICD-10-CM | POA: Diagnosis not present

## 2024-07-18 DIAGNOSIS — D251 Intramural leiomyoma of uterus: Secondary | ICD-10-CM | POA: Diagnosis not present

## 2024-07-24 ENCOUNTER — Other Ambulatory Visit: Payer: Self-pay

## 2024-07-30 ENCOUNTER — Other Ambulatory Visit: Payer: Self-pay | Admitting: Family

## 2024-07-30 ENCOUNTER — Encounter: Payer: Self-pay | Admitting: Family

## 2024-07-30 DIAGNOSIS — R41840 Attention and concentration deficit: Secondary | ICD-10-CM

## 2024-07-31 NOTE — Telephone Encounter (Signed)
 Noted

## 2024-08-07 ENCOUNTER — Ambulatory Visit: Payer: Self-pay | Admitting: Licensed Practical Nurse

## 2024-08-07 DIAGNOSIS — Z8349 Family history of other endocrine, nutritional and metabolic diseases: Secondary | ICD-10-CM | POA: Diagnosis not present

## 2024-08-07 DIAGNOSIS — E038 Other specified hypothyroidism: Secondary | ICD-10-CM | POA: Diagnosis not present

## 2024-08-23 ENCOUNTER — Ambulatory Visit: Admitting: Licensed Practical Nurse

## 2024-08-30 ENCOUNTER — Telehealth: Admitting: Physician Assistant

## 2024-08-30 DIAGNOSIS — J069 Acute upper respiratory infection, unspecified: Secondary | ICD-10-CM

## 2024-08-30 MED ORDER — LIDOCAINE VISCOUS HCL 2 % MT SOLN: 15.0000 mL | OROMUCOSAL | 0 refills | Status: AC | PRN

## 2024-08-30 MED ORDER — PSEUDOEPH-BROMPHEN-DM 30-2-10 MG/5ML PO SYRP: 5.0000 mL | ORAL_SOLUTION | Freq: Four times a day (QID) | ORAL | 0 refills | Status: DC | PRN

## 2024-08-30 MED ORDER — BENZONATATE 100 MG PO CAPS: 100.0000 mg | ORAL_CAPSULE | Freq: Two times a day (BID) | ORAL | 0 refills | Status: DC | PRN

## 2024-08-30 NOTE — Progress Notes (Signed)

## 2024-09-03 ENCOUNTER — Encounter: Payer: Self-pay | Admitting: Family

## 2024-09-03 ENCOUNTER — Other Ambulatory Visit: Payer: Self-pay

## 2024-09-03 ENCOUNTER — Ambulatory Visit: Admitting: Family

## 2024-09-03 VITALS — BP 116/79 | HR 92 | Temp 98.1°F | Ht 67.0 in | Wt 209.4 lb

## 2024-09-03 DIAGNOSIS — Z1152 Encounter for screening for COVID-19: Secondary | ICD-10-CM

## 2024-09-03 DIAGNOSIS — J019 Acute sinusitis, unspecified: Secondary | ICD-10-CM | POA: Diagnosis not present

## 2024-09-03 DIAGNOSIS — J208 Acute bronchitis due to other specified organisms: Secondary | ICD-10-CM

## 2024-09-03 DIAGNOSIS — R6889 Other general symptoms and signs: Secondary | ICD-10-CM | POA: Diagnosis not present

## 2024-09-03 DIAGNOSIS — B9689 Other specified bacterial agents as the cause of diseases classified elsewhere: Secondary | ICD-10-CM | POA: Diagnosis not present

## 2024-09-03 LAB — POC COVID19 BINAXNOW: SARS Coronavirus 2 Ag: NEGATIVE

## 2024-09-03 LAB — POCT INFLUENZA A/B
Influenza A, POC: NEGATIVE
Influenza B, POC: NEGATIVE

## 2024-09-03 MED ORDER — METHYLPREDNISOLONE 4 MG PO TBPK
ORAL_TABLET | ORAL | 0 refills | Status: AC
  Filled 2024-09-03: qty 21, 6d supply, fill #0

## 2024-09-03 MED ORDER — AMOXICILLIN-POT CLAVULANATE 875-125 MG PO TABS
1.0000 | ORAL_TABLET | Freq: Two times a day (BID) | ORAL | 0 refills | Status: DC
  Filled 2024-09-03: qty 20, 10d supply, fill #0

## 2024-09-03 MED ORDER — DOXYCYCLINE HYCLATE 100 MG PO TABS
100.0000 mg | ORAL_TABLET | Freq: Two times a day (BID) | ORAL | 0 refills | Status: DC
  Filled 2024-09-03: qty 20, 10d supply, fill #0

## 2024-09-03 NOTE — Progress Notes (Signed)
 Acute Office Visit  Subjective:     Patient ID: Mary Richards, female    DOB: [birth year], 38 y.o.   MRN: [id number]  Chief Complaint  Patient presents with  . Cough    HPI Patient is in today with c/o congestion, fever, cough and drainage x 1 week and worsening. Has been taking Mucinex that she thinks is actually making her symptoms worse. She has congestion in her throat and chest. Her son was sick but is better now.   Review of Systems  Constitutional:  Positive for fever.  HENT:  Positive for congestion.   Respiratory:  Positive for cough.   Cardiovascular: Negative.   Musculoskeletal: Negative.   Neurological: Negative.   Endo/Heme/Allergies: Negative.   Psychiatric/Behavioral: Negative.    All other systems reviewed and are negative.   Past Medical History:  Diagnosis Date  . Asthma   . Chronic migraine   . Complication of anesthesia   . DDD (degenerative disc disease), lumbosacral   . Dysplastic nevus 05/13/2024   Right Lower Back, mod to severe. Re-shave 06/04/24 Residual dysplastic nevus, margins free.  . Family history of adverse reaction to anesthesia    Mother and Father - PONV  . PONV (postoperative nausea and vomiting)   . Ruptured left tubal ectopic pregnancy causing hemoperitoneum 01/23/2021  . Wears contact lenses     Social History   Socioeconomic History  . Marital status: Married    Spouse name: Chayne Clonch  . Number of children: Not on file  . Years of education: Not on file  . Highest education level: Some college, no degree  Occupational History  . Not on file  Tobacco Use  . Smoking status: Never  . Smokeless tobacco: Never  Vaping Use  . Vaping status: Never Used  Substance and Sexual Activity  . Alcohol use: Yes    Alcohol/week: 2.0 standard drinks of alcohol    Types: 2 Standard drinks or equivalent per week    Comment: Social   . Drug use: Never  . Sexual activity: Yes    Birth control/protection: Surgical     Comment: spouse  Other Topics Concern  . 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    2 children   67 year old ( boy)   85 month old ( boy)   Social Drivers of Corporate investment banker Strain: High Risk (03/28/2024)   Overall Financial Resource Strain (CARDIA)   . Difficulty of Paying Living Expenses: Hard  Food Insecurity: Food Insecurity Present (03/28/2024)   Hunger Vital Sign   . Worried About Programme researcher, broadcasting/film/video in the Last Year: Sometimes true   . Ran Out of Food in the Last Year: Often true  Transportation Needs: No Transportation Needs (03/28/2024)   PRAPARE - Transportation   . Lack of Transportation (Medical): No   . Lack of Transportation (Non-Medical): No  Physical Activity: Insufficiently Active (03/28/2024)   Exercise Vital Sign   . Days of Exercise per Week: 2 days   . Minutes of Exercise per Session: 30 min  Stress: Stress Concern Present (03/28/2024)   [employer] of Occupational Health - Occupational Stress Questionnaire   . Feeling of Stress : Rather much  Social Connections: Socially Isolated (03/28/2024)   Social Connection and Isolation Panel   . Frequency of Communication with Friends and Family: Once a week   . Frequency of Social Gatherings with Friends and Family: Never   .  Attends Religious Services: Never   . Active Member of Clubs or Organizations: No   . Attends Banker Meetings: Not on file   . Marital Status: Married  Catering manager Violence: Not on file    Past Surgical History:  Procedure Laterality Date  . CHOLECYSTECTOMY    . COLONOSCOPY WITH PROPOFOL  N/A 02/19/2021   Procedure: COLONOSCOPY WITH PROPOFOL ;  Surgeon: Therisa Bi, MD;  Location: [facility] ENDOSCOPY;  Service: Gastroenterology;  Laterality: N/A;  . DIAGNOSTIC LAPAROSCOPY WITH REMOVAL OF ECTOPIC PREGNANCY Left 01/23/2021   Procedure: DIAGNOSTIC LAPAROSCOPY WITH REMOVAL OF ECTOPIC PREGNANCY;  Surgeon: Leonce Garnette BIRCH, MD;   Location: [facility] ORS;  Service: Gynecology;  Laterality: Left;  . PILONIDAL CYST EXCISION    . TONSILECTOMY, ADENOIDECTOMY, BILATERAL MYRINGOTOMY AND TUBES    . TYMPANOPLASTY Right 05/10/2019   Procedure: TYMPANOPLASTY;  Surgeon: Herminio Miu, MD;  Location: [facility];  Service: ENT;  Laterality: Right;    Family History  Problem Relation Age of Onset  . Irritable bowel syndrome Mother   . Brain cancer Father 68       Glioblastoma, died one year after dx  . Valvular heart disease Sister        Non Cancerous breast mass (both sisters)  . COPD Maternal Grandfather   . Brain cancer Paternal Grandmother 78       unsure what type, she was not treated  . Thyroid  cancer Neg Hx   . Colon cancer Neg Hx   . Breast cancer Neg Hx     Allergies  Allergen Reactions  . Azithromycin Hives, Shortness Of Breath, Swelling and Other (See Comments)    Throat swelling  . Sulfa Antibiotics Hives  . Ciprofloxacin Swelling and Other (See Comments)  . Macrobid  [Nitrofurantoin ] Hives    08/2023  . Moxifloxacin Other (See Comments)  . Moxifloxacin Hcl Hives  . Pamelor  [Nortriptyline  Hcl] Hives    Face and lip swelling, throat starting itchy  . Sulfamethoxazole-Trimethoprim  Rash and Other (See Comments)  . Wound Dressing Adhesive Rash    Dermabond    Current Outpatient Medications on File Prior to Visit  Medication Sig Dispense Refill  . albuterol  (PROVENTIL ) (2.5 MG/3ML) 0.083% nebulizer solution Take 3 mLs (2.5 mg total) by nebulization every 6 (six) hours as needed for wheezing or shortness of breath. 150 mL 2  . albuterol  (VENTOLIN  HFA) 108 (90 Base) MCG/ACT inhaler INHALE 2 PUFFS INTO THE LUNGS EVERY 6 HOURS AS NEEDED FOR WHEEZING OR SHORTNESS OF BREATH. 6.7 g 1  . AMBULATORY NON FORMULARY MEDICATION Apply twice daily to aa face x 3 months. 30 g 2  . azelastine  (ASTELIN ) 0.1 % nasal spray Place 1 spray into both nostrils 2 (two) times daily. Use in each nostril as directed 30 mL 3  .  budesonide -formoterol  (SYMBICORT ) 80-4.5 MCG/ACT inhaler Inhale 2 puffs into the lungs 2 (two) times daily. 10.2 g 3  . buPROPion  (WELLBUTRIN  XL) 150 MG 24 hr tablet Take 1 tablet (150 mg total) by mouth every morning. 90 tablet 3  . cetirizine (ZYRTEC) 10 MG tablet Take 10 mg by mouth daily.    . cholestyramine  (QUESTRAN ) 4 g packet Take 1 packet (4 g total) by mouth 2 (two) times daily. 60 each 11  . famotidine  (PEPCID ) 40 MG tablet Take 1 tablet (40 mg total) by mouth 2 (two) times daily. 60 tablet 5  . fluticasone  (FLONASE ) 50 MCG/ACT nasal spray Place 2 sprays into both nostrils daily. 16 g 0  .  fluticasone  (FLONASE ) 50 MCG/ACT nasal spray Place 2 sprays into both nostrils daily. 16 g 0  . hydrOXYzine  (ATARAX ) 25 MG tablet Take 1 tablet (25 mg total) by mouth at bedtime. 30 tablet 0  . hydrOXYzine  (ATARAX ) 25 MG tablet Take 1 tablet (25 mg total) by mouth 3 (three) times daily as needed. 90 tablet 3  . lidocaine  (XYLOCAINE ) 2 % solution Use as directed 15 mLs in the mouth or throat as needed for mouth pain. 10 mL 0  . meloxicam  (MOBIC ) 15 MG tablet Take 1 tablet (15 mg total) by mouth daily as needed for pain. 30 tablet 1  . metoCLOPramide  (REGLAN ) 5 MG tablet Take 1 tablet (5 mg total) by mouth every 6 (six) hours as needed for up to 5 days for nausea. 20 tablet 0  . traZODone  (DESYREL ) 50 MG tablet Take 0.5-1 tablets (25-50 mg total) by mouth at bedtime as needed for sleep. 30 tablet 1   No current facility-administered medications on file prior to visit.    BP 116/79   Pulse 92   Temp 98.1 F (36.7 C)   Ht 5' 7 (1.702 m)   Wt 209 lb 6.4 oz (95 kg)   SpO2 96%   BMI 32.80 kg/m chart     Objective:    BP 116/79   Pulse 92   Temp 98.1 F (36.7 C)   Ht 5' 7 (1.702 m)   Wt 209 lb 6.4 oz (95 kg)   SpO2 96%   BMI 32.80 kg/m    Physical Exam Vitals and nursing note reviewed.  Constitutional:      Appearance: Normal appearance. She is normal weight.  HENT:     Right Ear:  Tympanic membrane, ear canal and external ear normal.     Left Ear: Tympanic membrane, ear canal and external ear normal.     Nose: Congestion and rhinorrhea present.     Mouth/Throat:     Pharynx: Posterior oropharyngeal erythema present. No oropharyngeal exudate.  Cardiovascular:     Rate and Rhythm: Normal rate and regular rhythm.     Pulses: Normal pulses.     Heart sounds: Normal heart sounds.  Pulmonary:     Effort: Pulmonary effort is normal.     Breath sounds: Wheezing present.     Comments: Mild wheezing Musculoskeletal:        General: Normal range of motion.     Cervical back: Normal range of motion and neck supple.  Skin:    General: Skin is warm and dry.  Neurological:     General: No focal deficit present.     Mental Status: She is alert and oriented to person, place, and time. Mental status is at baseline.  Psychiatric:        Mood and Affect: Mood normal.        Behavior: Behavior normal.        Thought Content: Thought content normal.    Results for orders placed or performed in visit on 09/03/24  POCT Influenza A/B  Result Value Ref Range   Influenza A, POC Negative Negative   Influenza B, POC Negative Negative        Assessment & Plan:   Problem List Items Addressed This Visit   None Visit Diagnoses       Acute bacterial sinusitis    -  Primary   Relevant Medications   methylPREDNISolone  (MEDROL  DOSEPAK) 4 MG TBPK tablet   doxycycline  (VIBRA -TABS) 100 MG tablet  Encounter for screening for COVID-19       Relevant Orders   POC COVID-19 BinaxNow     Flu-like symptoms       Relevant Orders   POCT Influenza A/B (Completed)     Acute bronchitis due to other specified organisms           Meds ordered this encounter  Medications  . DISCONTD: amoxicillin -clavulanate (AUGMENTIN ) 875-125 MG tablet    Sig: Take 1 tablet by mouth 2 (two) times daily.    Dispense:  20 tablet    Refill:  0  . methylPREDNISolone  (MEDROL  DOSEPAK) 4 MG TBPK tablet     Sig: As directed    Dispense:  21 tablet    Refill:  0  . doxycycline  (VIBRA -TABS) 100 MG tablet    Sig: Take 1 tablet (100 mg total) by mouth 2 (two) times daily.    Dispense:  20 tablet    Refill:  0    Cancel Augmentin  please   Call the office if symptoms worsen or persist. Recheck as scheduled and sooner as needed.  No follow-ups on file.  Analaura Messler B Eros Montour, FNP

## 2024-09-06 ENCOUNTER — Other Ambulatory Visit: Payer: Self-pay | Admitting: Medical Genetics

## 2024-09-09 ENCOUNTER — Other Ambulatory Visit
Admission: RE | Admit: 2024-09-09 | Discharge: 2024-09-09 | Disposition: A | Discharge status: Home / Self Care | Payer: Self-pay | Source: Ambulatory Visit | Attending: Medical Genetics | Admitting: Medical Genetics

## 2024-09-17 LAB — GENECONNECT MOLECULAR SCREEN: Genetic Analysis Overall Interpretation: NEGATIVE

## 2024-09-23 ENCOUNTER — Telehealth: Admitting: Physician Assistant

## 2024-09-23 ENCOUNTER — Other Ambulatory Visit: Payer: Self-pay

## 2024-09-23 DIAGNOSIS — J020 Streptococcal pharyngitis: Secondary | ICD-10-CM | POA: Diagnosis not present

## 2024-09-23 MED ORDER — AMOXICILLIN 500 MG PO CAPS
500.0000 mg | ORAL_CAPSULE | Freq: Two times a day (BID) | ORAL | 0 refills | Status: AC
  Filled 2024-09-23: qty 20, 10d supply, fill #0

## 2024-09-23 NOTE — Progress Notes (Signed)

## 2024-10-30 ENCOUNTER — Telehealth: Admitting: Physician Assistant

## 2024-10-30 DIAGNOSIS — J111 Influenza due to unidentified influenza virus with other respiratory manifestations: Secondary | ICD-10-CM | POA: Diagnosis not present

## 2024-10-30 MED ORDER — XOFLUZA (80 MG DOSE) 1 X 80 MG PO TBPK: 1.0000 | ORAL_TABLET | Freq: Once | ORAL | 0 refills | Status: DC

## 2024-10-30 MED ORDER — PROMETHAZINE-DM 6.25-15 MG/5ML PO SYRP: 5.0000 mL | ORAL_SOLUTION | Freq: Four times a day (QID) | ORAL | 0 refills | Status: DC | PRN

## 2024-10-30 NOTE — Progress Notes (Signed)
 E visit for Flu like symptoms   We are sorry that you are not feeling well.  Here is how we plan to help! Based on what you have shared with me it looks like you may have a respiratory virus that may be influenza.  Influenza or the flu is  an infection caused by a respiratory virus. The flu virus is highly contagious and persons who did not receive their yearly flu vaccination may catch the flu from close contact.  We have anti-viral medications to treat the viruses that cause this infection. They are not a cure and only shorten the course of the infection. These prescriptions are most effective when they are given within the first 2 days of flu symptoms. Antiviral medications are indicated if you have a high risk of complications from the flu. You should  also consider an antiviral medication if you are in close contact with someone who is at risk. These medications can help patients avoid complications from the flu but have side effects that you should know.   Possible side effects from Tamiflu  or oseltamivir  include nausea, vomiting, diarrhea, dizziness, headaches, eye redness, sleep problems or other respiratory symptoms. You should not take Tamiflu  if you have an allergy to oseltamivir  or any to the ingredients in Tamiflu .  Based upon your symptoms and potential risk factors I have prescribed Xofluza  (Baloxavir) 40mg  tabs (2 tablets) by mouth once (if you weigh above 176 pounds - total of 80 mg).   For nasal congestion, you may use an oral decongestant such as Mucinex D or if you have glaucoma or high blood pressure use plain Mucinex.  Saline nasal spray or nasal drops can help and can safely be used as often as needed for congestion.  If you have a sore or scratchy throat, use a saltwater gargle-  to  teaspoon of salt dissolved in a 4-ounce to 8-ounce glass of warm water.  Gargle the solution for approximately 15-30 seconds and then spit.  It is important not to swallow the solution.   You can also use throat lozenges/cough drops and Chloraseptic spray to help with throat pain or discomfort.  Warm or cold liquids can also be helpful in relieving throat pain.  For headache, pain or general discomfort, you can use Ibuprofen  or Tylenol  as directed.   Some authorities believe that zinc sprays or the use of Echinacea may shorten the course of your symptoms.  I have prescribed the following medications to help lessen symptoms: I have prescribed Phenergan  DM 6.25 mg/15 mg. You make take one teaspoon / 5 ml every 4-6 hours as needed for cough  You are to isolate at home until you have been fever-free for at least 24 hours without a fever-reducing medication, and symptoms have been steadily improving for 24 hours.  If you must be around other household members who do not have symptoms, you need to make sure that both you and the family members are masking consistently with a high-quality mask.  If you note any worsening of symptoms despite treatment, please seek an in-person evaluation ASAP. If you note any significant shortness of breath or any chest pain, please seek ED evaluation. Please do not delay care!  ANYONE WHO HAS FLU SYMPTOMS SHOULD: Stay home. The flu is highly contagious and going out or to work exposes others! Be sure to drink plenty of fluids. Water is fine as well as fruit juices, sodas and electrolyte beverages. You may want to stay away from caffeine or  alcohol. If you are nauseated, try taking small sips of liquids. How do you know if you are getting enough fluid? Your urine should be a pale yellow or almost colorless. Get rest. Taking a steamy shower or using a humidifier may help nasal congestion and ease sore throat pain. Using a saline nasal spray works much the same way. Cough drops, hard candies and sore throat lozenges may ease your cough. Line up a caregiver. Have someone check on you regularly.  GET HELP RIGHT AWAY IF: You cannot keep down liquids or your  medications. You become short of breath Your fell like you are going to pass out or loose consciousness. Your symptoms persist after you have completed your treatment plan  MAKE SURE YOU  Understand these instructions. Will watch your condition. Will get help right away if you are not doing well or get worse.  Your e-visit answers were reviewed by a board certified advanced clinical practitioner to complete your personal care plan.  Depending on the condition, your plan could have included both over the counter or prescription medications.  If there is a problem please reply  once you have received a response from your provider.  Your safety is important to us .  If you have drug allergies check your prescription carefully.    You can use MyChart to ask questions about todays visit, request a non-urgent call back, or ask for a work or school excuse for 24 hours related to this e-Visit. If it has been greater than 24 hours you will need to follow up with your provider, or enter a new e-Visit to address those concerns.  You will get an e-mail in the next two days asking about your experience.  I hope that your e-visit has been valuable and will speed your recovery. Thank you for using e-visits.   I have spent 5 minutes in review of e-visit questionnaire, review and updating patient chart, medical decision making and response to patient.   Delon CHRISTELLA Dickinson, PA-C

## 2024-10-31 MED ORDER — OSELTAMIVIR PHOSPHATE 75 MG PO CAPS
75.0000 mg | ORAL_CAPSULE | Freq: Two times a day (BID) | ORAL | 0 refills | Status: AC
  Filled 2024-10-31: qty 10, 5d supply, fill #0

## 2024-10-31 NOTE — Addendum Note (Signed)
 Addended by: GLADIS ELSIE BROCKS on: 10/31/2024 12:02 PM   Modules accepted: Orders

## 2024-11-04 ENCOUNTER — Other Ambulatory Visit: Payer: Self-pay

## 2024-11-04 MED FILL — Azelastine HCl Nasal Spray 0.1% (137 MCG/SPRAY): NASAL | 50 days supply | Qty: 30 | Fill #1 | Status: AC

## 2024-12-02 ENCOUNTER — Other Ambulatory Visit: Payer: Self-pay

## 2024-12-03 ENCOUNTER — Telehealth

## 2024-12-03 DIAGNOSIS — B3731 Acute candidiasis of vulva and vagina: Secondary | ICD-10-CM

## 2024-12-04 ENCOUNTER — Other Ambulatory Visit: Payer: Self-pay

## 2024-12-04 MED ORDER — FLUCONAZOLE 150 MG PO TABS
ORAL_TABLET | ORAL | 0 refills | Status: AC
  Filled 2024-12-04: qty 2, 3d supply, fill #0

## 2024-12-04 NOTE — Progress Notes (Signed)

## 2024-12-12 ENCOUNTER — Ambulatory Visit (INDEPENDENT_AMBULATORY_CARE_PROVIDER_SITE_OTHER): Admitting: Family

## 2024-12-12 ENCOUNTER — Encounter: Payer: Self-pay | Admitting: Family

## 2024-12-12 ENCOUNTER — Encounter: Admitting: Family

## 2024-12-12 VITALS — BP 120/78 | HR 83 | Temp 98.2°F | Ht 67.0 in | Wt 219.2 lb

## 2024-12-12 DIAGNOSIS — F411 Generalized anxiety disorder: Secondary | ICD-10-CM

## 2024-12-12 DIAGNOSIS — E669 Obesity, unspecified: Secondary | ICD-10-CM | POA: Diagnosis not present

## 2024-12-12 DIAGNOSIS — Z0000 Encounter for general adult medical examination without abnormal findings: Secondary | ICD-10-CM | POA: Diagnosis not present

## 2024-12-12 NOTE — Progress Notes (Signed)
 "  Assessment & Plan:  Obesity (BMI 30-39.9) Assessment & Plan: Counseled on creating caloric deficit, low glycemic diet.  Discussed metformin for appetite suppression.  Counseled on titration, GI side effects. she may consider this.  Briefly discussed GLP-1 agonists.  If she considers this, advised she would need a dedicated visit to discuss this medication in further detail.  In particular, she needs to clarify family history of thyroid  disease.   Routine physical examination Assessment & Plan: Clinical breast any performed.  Deferred pelvic exam in the absence complaints and Pap smear is up-to-date.  Encouraged continued exercise.  Orders: -     Comprehensive metabolic panel with GFR; Future -     TSH; Future -     Hemoglobin A1c; Future -     CBC with Differential/Platelet; Future -     Lipid panel; Future -     Insulin, random; Future -     VITAMIN D  25 Hydroxy (Vit-D Deficiency, Fractures); Future  GAD (generalized anxiety disorder) -     TSH; Future     Return precautions given.   Risks, benefits, and alternatives of the medications and treatment plan prescribed today were discussed, and patient expressed understanding.   Education regarding symptom management and diagnosis given to patient on AVS either electronically or printed.  Return in about 6 months (around 06/11/2025) for Fasting labs in 2-3 weeks.  Rollene Northern, FNP  Subjective:    Patient ID: Mary Richards, female    DOB: [birth year], 39 y.o.   MRN: [id number]  CC: Mary Richards is a 39 y.o. female who presents today for physical exam.    HPI: HPI Discussed the use of AI scribe software for clinical note transcription with the patient, who gave verbal consent to proceed.  History of Present Illness   Mary Richards is a 39 year old female who presents for annual CPE.  She has experienced weight gain accompanied by joint pain and plantar fasciitis, which limit her physical activity.  She reports being active at work and estimates she burns nearly 1200 calories daily, and she does not indulge in snacks or overeating, but is unsure why she is gaining weight. Her diet includes English muffins with turkey sausage, yogurt, and sweet and spicy tuna packs with crackers. She drinks water with a sugar-free enhancer and avoids sodas or sweet teas.  She previously took Wellbutrin  but discontinued it due to prolonged menstrual bleeding. There is a family history of thyroid  issues on her mother's side.  She does not know the details of this  She follows with dermatology for skin checks q2mos.      No family history of breast or colon cancer. Colonoscopy 02/19/2021; due 2029  Cervical Cancer Screening:  02/08/24 NILM, neg HPV         Tetanus - UTD         Exercise: Gets regular exercise.   Alcohol use:  rare Smoking/tobacco use: Nonsmoker.    Health Maintenance  Topic Date Due   Pneumococcal Vaccine (1 of 2 - PCV) Never done   COVID-19 Vaccine (3 - 2025-26 season) 12/28/2024*   Flu Shot  02/11/2025*   Colon Cancer Screening  02/20/2028   Pap with HPV screening  02/07/2029   DTaP/Tdap/Td vaccine (10 - Td or Tdap) 11/12/2031   HPV Vaccine (No Doses Required) Completed   Hepatitis C Screening  Completed   Meningitis B Vaccine  Aged Out   HIV Screening  Discontinued  *Topic was postponed.  The date shown is not the original due date.    ALLERGIES: Azithromycin, Sulfa antibiotics, Ciprofloxacin, Macrobid  [nitrofurantoin ], Moxifloxacin, Moxifloxacin hcl, Pamelor  [nortriptyline  hcl], Sulfamethoxazole-trimethoprim , and Wound dressing adhesive  Medications Ordered Prior to Encounter[1]  Review of Systems  Constitutional:  Negative for chills and fever.  Respiratory:  Negative for cough.   Cardiovascular:  Negative for chest pain and palpitations.  Gastrointestinal:  Negative for nausea and vomiting.      Objective:    BP 120/78   Pulse 83   Temp 98.2 F (36.8 C) (Oral)    Ht 5' 7 (1.702 m)   Wt 219 lb 3.2 oz (99.4 kg)   SpO2 98%   BMI 34.33 kg/m   BP Readings from Last 3 Encounters:  12/12/24 120/78  09/03/24 116/79  07/17/24 139/81   Wt Readings from Last 3 Encounters:  12/12/24 219 lb 3.2 oz (99.4 kg)  09/03/24 209 lb 6.4 oz (95 kg)  07/17/24 213 lb 4.8 oz (96.8 kg)    Physical Exam Vitals reviewed.  Constitutional:      Appearance: She is well-developed.  Eyes:     Conjunctiva/sclera: Conjunctivae normal.  Neck:     Thyroid : No thyroid  mass or thyromegaly.  Cardiovascular:     Rate and Rhythm: Normal rate and regular rhythm.     Pulses: Normal pulses.     Heart sounds: Normal heart sounds.  Pulmonary:     Effort: Pulmonary effort is normal.     Breath sounds: Normal breath sounds. No wheezing, rhonchi or rales.  Chest:  Breasts:    Breasts are symmetrical.     Right: No inverted nipple, mass, nipple discharge, skin change or tenderness.     Left: No inverted nipple, mass, nipple discharge, skin change or tenderness.  Lymphadenopathy:     Head:     Right side of head: No submental, submandibular, tonsillar, preauricular, posterior auricular or occipital adenopathy.     Left side of head: No submental, submandibular, tonsillar, preauricular, posterior auricular or occipital adenopathy.     Cervical: No cervical adenopathy.     Right cervical: No superficial, deep or posterior cervical adenopathy.    Left cervical: No superficial, deep or posterior cervical adenopathy.  Skin:    General: Skin is warm and dry.  Neurological:     Mental Status: She is alert.  Psychiatric:        Speech: Speech normal.        Behavior: Behavior normal.        Thought Content: Thought content normal.            [1]  Current Outpatient Medications on File Prior to Visit  Medication Sig Dispense Refill   albuterol  (PROVENTIL ) (2.5 MG/3ML) 0.083% nebulizer solution Take 3 mLs (2.5 mg total) by nebulization every 6 (six) hours as needed for  wheezing or shortness of breath. 150 mL 2   albuterol  (VENTOLIN  HFA) 108 (90 Base) MCG/ACT inhaler INHALE 2 PUFFS INTO THE LUNGS EVERY 6 HOURS AS NEEDED FOR WHEEZING OR SHORTNESS OF BREATH. 6.7 g 1   AMBULATORY NON FORMULARY MEDICATION Apply twice daily to aa face x 3 months. 30 g 2   azelastine  (ASTELIN ) 0.1 % nasal spray Place 1 spray into both nostrils 2 (two) times daily. Use in each nostril as directed 30 mL 3   budesonide -formoterol  (SYMBICORT ) 80-4.5 MCG/ACT inhaler Inhale 2 puffs into the lungs 2 (two) times daily. 10.2 g 3   cetirizine (ZYRTEC) 10 MG tablet Take 10 mg  by mouth daily.     cholestyramine  (QUESTRAN ) 4 g packet Take 1 packet (4 g total) by mouth 2 (two) times daily. 60 each 11   famotidine  (PEPCID ) 40 MG tablet Take 1 tablet (40 mg total) by mouth 2 (two) times daily. 60 tablet 5   fluconazole  (DIFLUCAN ) 150 MG tablet Take 1 tablet by mouth once. Repeat in 3 days if needed. 2 tablet 0   fluticasone  (FLONASE ) 50 MCG/ACT nasal spray Place 2 sprays into both nostrils daily. 16 g 0   fluticasone  (FLONASE ) 50 MCG/ACT nasal spray Place 2 sprays into both nostrils daily. 16 g 0   hydrOXYzine  (ATARAX ) 25 MG tablet Take 1 tablet (25 mg total) by mouth at bedtime. 30 tablet 0   hydrOXYzine  (ATARAX ) 25 MG tablet Take 1 tablet (25 mg total) by mouth 3 (three) times daily as needed. 90 tablet 3   meloxicam  (MOBIC ) 15 MG tablet Take 1 tablet (15 mg total) by mouth daily as needed for pain. 30 tablet 1   methylPREDNISolone  (MEDROL  DOSEPAK) 4 MG TBPK tablet As directed 21 tablet 0   traZODone  (DESYREL ) 50 MG tablet Take 0.5-1 tablets (25-50 mg total) by mouth at bedtime as needed for sleep. 30 tablet 1   lidocaine  (XYLOCAINE ) 2 % solution Use as directed 15 mLs in the mouth or throat as needed for mouth pain. (Patient not taking: Reported on 12/12/2024) 10 mL 0   metoCLOPramide  (REGLAN ) 5 MG tablet Take 1 tablet (5 mg total) by mouth every 6 (six) hours as needed for up to 5 days for nausea.  (Patient not taking: Reported on 12/12/2024) 20 tablet 0   No current facility-administered medications on file prior to visit.   "

## 2024-12-12 NOTE — Patient Instructions (Addendum)
 " Clarify thyroid  history.   Consider wegovy or metformin   Please download Myfitness Pal App ( basic version is free).   You may log every thing you eat for even 2-3 days to get a better of idea of total daily calories. To loose weight, we have to create caloric deficit to loose weight. The goal is 1-2 lbs per week of weight loss.   Excellent article below from [facility].   https://www.health.criticalz.it  Calorie counting made easy  Eat less, exercise more. If only it were that simple! As most dieters know, losing weight can be very challenging. As this report details, a range of influences can affect how people gain and lose weight. But a basic understanding of how to tip your energy balance in favor of weight loss is a good place to start.  Start by determining how many calories you should consume each day. To do so, you need to know how many calories you need to maintain your current weight. Doing this requires a few simple calculations.  First, multiply your current weight by 15 -- that's roughly the number of calories per pound of body weight needed to maintain your current weight if you are moderately active. Moderately active means getting at least 30 minutes of physical activity a day in the form of exercise (walking at a brisk pace, climbing stairs, or active gardening). Let's say you're a woman who is 5 feet, 4 inches tall and weighs 155 pounds, and you need to lose about 15 pounds to put you in a healthy weight range. If you multiply 155 by 15, you will get 2,325, which is the number of calories per day that you need in order to maintain your current weight (weight-maintenance calories). To lose weight, you will need to get below that total.  For example, to lose 1 to 2 pounds a week -- a rate that experts consider safe -- your food consumption should provide 500 to 1,000 calories less than your total weight-maintenance calories. If you  need 2,325 calories a day to maintain your current weight, reduce your daily calories to between 1,325 and 1,825. If you are sedentary, you will also need to build more activity into your day. In order to lose at least a pound a week, try to do at least 30 minutes of physical activity on most days, and reduce your daily calorie intake by at least 500 calories. However, calorie intake should not fall below 1,200 a day in women or 1,500 a day in men, except under the supervision of a health professional. Eating too few calories can endanger your health by depriving you of needed nutrients.  Meeting your calorie target How can you meet your daily calorie target? One approach is to add up the number of calories per serving of all the foods that you eat, and then plan your menus accordingly. You can buy books that list calories per serving for many foods. In addition, the nutrition labels on all packaged foods and beverages provide calories per serving information. Make a point of reading the labels of the foods and drinks you use, noting the number of calories and the serving sizes. Many recipes published in cookbooks, newspapers, and magazines provide similar information.  If you hate counting calories, a different approach is to restrict how much and how often you eat, and to eat meals that are low in calories. Dietary guidelines issued by the American Heart Association stress common sense in choosing your foods rather than  focusing strictly on numbers, such as total calories or calories from fat. Whichever method you choose, research shows that a regular eating schedule -- with meals and snacks planned for certain times each day -- makes for the most successful approach. The same applies after you have lost weight and want to keep it off. Sticking with an eating schedule increases your chance of maintaining your new weight.    This is  Dr. Tullo's  ( an amazing physician in my office!)  example of a  Low  GI  Diet:  It will allow you to lose 4 to 8  lbs  per month if you follow it carefully.  Your goal with exercise is a minimum of 30 minutes of aerobic exercise 5 days per week (Walking does not count once it becomes easy!)    All of the foods can be found at grocery stores and in bulk at [employer].  The Atkins protein bars and shakes are available in more varieties at Target, [employer] and [employer].     7 AM Breakfast:  Choose from the following:  Low carbohydrate Protein  Shakes (I recommend the  Premier Protein chocolate shakes,  EAS AdvantEdge Carb Control shakes  Or the Atkins shakes all are under 3 net carbs)     a scrambled egg/bacon/cheese burrito made with Mission's carb balance whole wheat tortilla  (about 10 net carbs )  Medical Laboratory Scientific Officer (basically a quiche without the pastry crust) that is eaten cold and very convenient way to get your eggs.  8 carbs)  If you make your own protein shakes, avoid bananas and pineapple,  And use low carb greek yogurt or original /unsweetened almond or soy milk    Avoid cereal and bananas, oatmeal and cream of wheat and grits. They are loaded with carbohydrates!   10 AM: high protein snack:  Protein bar by Atkins (the snack size, under 200 cal, usually < 6 net carbs).    A stick of cheese:  Around 1 carb,  100 cal     Dannon Light n Fit Greek Yogurt  (80 cal, 8 carbs)  Other so called protein bars and Greek yogurts tend to be loaded with carbohydrates.  Remember, in food advertising, the word energy is synonymous for  carbohydrate.  Lunch:   A Sandwich using the bread choices listed, Can use any  Eggs,  lunchmeat, grilled meat or canned tuna), avocado, regular mayo/mustard  and cheese.  A Salad using blue cheese, ranch,  Goddess or vinagrette,  Avoid taco shells, croutons or confetti and no candied nuts but regular nuts OK.   No pretzels, nabs  or chips.  Pickles and miniature sweet peppers are a good low carb  alternative that provide a crunch  The bread is the only source of carbohydrate in a sandwich and  can be decreased by trying some of the attached alternatives to traditional loaf bread   Avoid Low fat dressings, as well as [place] and [employer] dressings They are loaded with sugar!   3 PM/ Mid day  Snack:  Consider  1 ounce of  almonds, walnuts, pistachios, pecans, peanuts,  Macadamia nuts or a nut medley.  Avoid granola and granola bars   Mixed nuts are ok in moderation as long as there are no raisins,  cranberries or dried fruit.   KIND bars are OK if you get the low glycemic index variety   Try the prosciutto/mozzarella cheese sticks by [employer]  In deli /backery section   High protein      6 PM  Dinner:     Meat/fowl/fish with a green salad, and either broccoli, cauliflower, green beans, spinach, brussel sprouts or  Lima beans. DO NOT BREAD THE PROTEIN!!      There is a low carb pasta by Dreamfield's that is acceptable and tastes great: only 5 digestible carbs/serving.( All grocery stores but BJs carry it ) Several ready made meals are available low carb:   Try Michel Angelo's chicken piccata or chicken or eggplant parm over low carb pasta.(Lowes and BJs)   [place] Carnitas (pulled pork, no sauce,  0 carbs) or his beef pot roast to make a dinner burrito (at [employer])  Pesto over low carb pasta (bj's sells a good quality pesto in the center refrigerated section of the deli   Try satueeing  Glenna Blonder with mushroooms as a good side   [employer] makes a mashed cauliflower that tastes like mashed potatoes  Whole wheat pasta is still full of digestible carbs and  Not as low in glycemic index as Dreamfield's.   Brown rice is still rice,  So skip the rice and noodles if you eat Chinese or Thai (or at least limit to 1/2 cup)  9 PM snack :   Breyer's low carb fudgsicle or  ice cream bar (Carb Smart line), or  Weight Watcher's ice cream bar , or another no sugar added ice  cream;  a serving of fresh berries/cherries with whipped cream   Cheese or DANNON'S LlGHT N FIT GREEK YOGURT  8 ounces of [place] unsweetened almond/cococunut milk    Treat yourself to a parfait made with whipped cream blueberiies, walnuts and vanilla greek yogurt  Avoid bananas, pineapple, grapes  and watermelon on a regular basis because they are high in sugar.  THINK OF THEM AS DESSERT  Remember that snack Substitutions should be less than 10 NET carbs per serving and meals < 20 carbs. Remember to subtract fiber grams to get the net carbs.    "

## 2024-12-13 NOTE — Assessment & Plan Note (Addendum)
 Counseled on creating caloric deficit, low glycemic diet.  Discussed metformin for appetite suppression.  Counseled on titration, GI side effects. she may consider this.  Briefly discussed GLP-1 agonists.  If she considers this, advised she would need a dedicated visit to discuss this medication in further detail.  In particular, she needs to clarify family history of thyroid  disease.

## 2024-12-13 NOTE — Assessment & Plan Note (Signed)
 Clinical breast any performed.  Deferred pelvic exam in the absence complaints and Pap smear is up-to-date.  Encouraged continued exercise.

## 2024-12-25 ENCOUNTER — Other Ambulatory Visit

## 2025-05-13 ENCOUNTER — Ambulatory Visit: Admitting: Dermatology

## 2025-06-11 ENCOUNTER — Ambulatory Visit: Admitting: Family
